# Patient Record
Sex: Female | Born: 1995 | Hispanic: No | Marital: Single
Health system: Northeastern US, Academic
[De-identification: ages and names within clinical notes are randomized; demographics above are authoritative.]

## PROBLEM LIST (undated history)

## (undated) DIAGNOSIS — I471 Supraventricular tachycardia, unspecified: Secondary | ICD-10-CM

## (undated) DIAGNOSIS — F419 Anxiety disorder, unspecified: Secondary | ICD-10-CM

## (undated) DIAGNOSIS — T7840XA Allergy, unspecified, initial encounter: Secondary | ICD-10-CM

## (undated) DIAGNOSIS — R87610 Atypical squamous cells of undetermined significance on cytologic smear of cervix (ASC-US): Secondary | ICD-10-CM

## (undated) DIAGNOSIS — F32A Depression, unspecified: Secondary | ICD-10-CM

## (undated) DIAGNOSIS — J45909 Unspecified asthma, uncomplicated: Secondary | ICD-10-CM

## (undated) DIAGNOSIS — K219 Gastro-esophageal reflux disease without esophagitis: Secondary | ICD-10-CM

## (undated) HISTORY — DX: Anxiety disorder, unspecified: F41.9

## (undated) HISTORY — PX: WISDOM TOOTH EXTRACTION: SHX21

## (undated) HISTORY — DX: Allergy, unspecified, initial encounter: T78.40XA

## (undated) HISTORY — DX: Depression, unspecified: F32.A

## (undated) HISTORY — DX: Atypical squamous cells of undetermined significance on cytologic smear of cervix (ASC-US): R87.610

## (undated) HISTORY — DX: Gastro-esophageal reflux disease without esophagitis: K21.9

## (undated) HISTORY — DX: Supraventricular tachycardia, unspecified: I47.10

## (undated) HISTORY — DX: Unspecified asthma, uncomplicated: J45.909

## (undated) HISTORY — DX: Supraventricular tachycardia: I47.1

---

## 2019-09-02 DIAGNOSIS — Z975 Presence of (intrauterine) contraceptive device: Secondary | ICD-10-CM

## 2019-09-02 HISTORY — DX: Presence of (intrauterine) contraceptive device: Z97.5

## 2020-02-09 ENCOUNTER — Ambulatory Visit: Payer: Self-pay | Admitting: Internal Medicine

## 2020-02-14 ENCOUNTER — Encounter: Payer: Self-pay | Admitting: Internal Medicine

## 2020-02-15 ENCOUNTER — Ambulatory Visit: Payer: Self-pay | Admitting: Internal Medicine

## 2020-02-16 ENCOUNTER — Encounter: Payer: Self-pay | Admitting: Internal Medicine

## 2020-02-16 ENCOUNTER — Telehealth (INDEPENDENT_AMBULATORY_CARE_PROVIDER_SITE_OTHER): Payer: 59 | Admitting: Internal Medicine

## 2020-02-16 ENCOUNTER — Other Ambulatory Visit: Payer: Self-pay

## 2020-02-16 DIAGNOSIS — Z7689 Persons encountering health services in other specified circumstances: Secondary | ICD-10-CM | POA: Diagnosis not present

## 2020-02-16 DIAGNOSIS — F988 Other specified behavioral and emotional disorders with onset usually occurring in childhood and adolescence: Secondary | ICD-10-CM | POA: Insufficient documentation

## 2020-02-16 DIAGNOSIS — R87610 Atypical squamous cells of undetermined significance on cytologic smear of cervix (ASC-US): Secondary | ICD-10-CM | POA: Insufficient documentation

## 2020-02-16 DIAGNOSIS — E559 Vitamin D deficiency, unspecified: Secondary | ICD-10-CM | POA: Diagnosis not present

## 2020-02-16 DIAGNOSIS — R7303 Prediabetes: Secondary | ICD-10-CM

## 2020-02-16 MED ORDER — AMPHETAMINE-DEXTROAMPHETAMINE 15 MG PO TABS: 15.0000 mg | ORAL_TABLET | Freq: Two times a day (BID) | ORAL | 0 refills | Status: DC

## 2020-02-16 NOTE — Progress Notes (Signed)
Virtual Visit via Telephone Note  I connected with Kristina Blair, on 02/16/2020 at 3:38 PM by telephone due to the COVID-19 pandemic and verified that I am speaking with the correct person using two identifiers.   Consent: I discussed the limitations, risks, security and privacy concerns of performing an evaluation and management service by telephone and the availability of in person appointments. I also discussed with the patient that there may be a patient responsible charge related to this service. The patient expressed understanding and agreed to proceed.   Location of Patient: Home  Location of Provider: Clinic    Persons participating in Telemedicine visit: Trenell Moxey [facility] Dr. Earlene Plater    History of Present Illness: Patient has a visit to establish care. Moved here from [place]. Was previously established with PCP. Reports history of ADHD, Bipolar Disorder. In Jan last year was found to be pre-diabetic and have Vit D deficiency. Takes Adderall 15 mg BID for ADHD, has not established with psych yet. Has 3 tablets left of medication. Takes OTC Vit D supplement. Reports abnormal PAP in June 2021.    Past Medical History:  Diagnosis Date   Anxiety    Phreesia 02/13/2020   ASCUS of cervix with negative high risk HPV    Asthma    Phreesia 02/13/2020   Depression    Phreesia 02/13/2020   GERD (gastroesophageal reflux disease)    Phreesia 02/13/2020   Allergies  Allergen Reactions   Dimenhydrinate     Current Outpatient Medications on File Prior to Visit  Medication Sig Dispense Refill   amphetamine-dextroamphetamine (ADDERALL) 15 MG tablet Take 15 mg by mouth 2 (two) times daily.     No current facility-administered medications on file prior to visit.    Observations/Objective: NAD. Speaking clearly.  Work of breathing normal.  Alert and oriented. Mood appropriate.   Assessment and Plan: 1. Encounter to establish care Reviewed patient's  PMH, social history, surgical history, and medications.  Will be due for annual exam, screening blood work, and health maintenance topics in Jan 2022. Have asked patient to return for visit to address these items.   2. Attention deficit disorder, unspecified hyperactivity presence Discussed will not be able to manage her ADHD long term but will do one time refill until able to establish with psych.  - Ambulatory referral to Psychiatry - amphetamine-dextroamphetamine (ADDERALL) 15 MG tablet; Take 1 tablet by mouth 2 (two) times daily.  Dispense: 180 tablet; Refill: 0  3. ASCUS of cervix with negative high risk HPV Able to abstract result from [facility]. Completed 08/12/19. Showed ASCUS with neg high risk HPV. Given age <25, routine screening in 3 year interval is indicated for this result.   4. Vitamin D deficiency Continue OTC supplement. Will check Vit D level at in person exam.   5. Prediabetes Monitor A1c at upcoming annual exam.      Follow Up Instructions: Annual exam    I discussed the assessment and treatment plan with the patient. The patient was provided an opportunity to ask questions and all were answered. The patient agreed with the plan and demonstrated an understanding of the instructions.   The patient was advised to call back or seek an in-person evaluation if the symptoms worsen or if the condition fails to improve as anticipated.     I provided 14 minutes total of non-face-to-face time during this encounter including median intraservice time, reviewing previous notes, investigations, ordering medications, medical decision making, coordinating care and patient verbalized understanding  at the end of the visit.    Marcy Siren, D.O. Primary Care at [facility]  02/16/2020, 3:38 PM

## 2020-04-03 ENCOUNTER — Encounter: Payer: 59 | Admitting: Internal Medicine

## 2020-04-24 ENCOUNTER — Encounter: Payer: 59 | Admitting: Internal Medicine

## 2020-05-08 ENCOUNTER — Encounter: Payer: 59 | Admitting: Internal Medicine

## 2020-07-15 ENCOUNTER — Encounter: Admit: 2020-07-15 | Payer: PRIVATE HEALTH INSURANCE

## 2020-07-15 ENCOUNTER — Inpatient Hospital Stay: Admit: 2020-07-15 | Discharge: 2020-07-15 | Payer: Medicaid - Out of State

## 2020-07-15 LAB — MANUAL DIFFERENTIAL
BKR EOSINOPHILS (DIFF) (MC): 1 % (ref 0–5)
BKR LYMPHOCYTES (DIFF) (MC): 43 % — ABNORMAL HIGH (ref 20–40)
BKR MONOCYTES (DIFF) (MC): 10 % (ref 0–10)
BKR NEUTROPHILS (DIFF) (MC): 46 % — ABNORMAL LOW (ref 50.0–70.0)
BKR PLATELET ESTIMATE (DIFF) (MC): NORMAL
BKR WAM NORMAL RBC MORPHOLOGY: NORMAL

## 2020-07-15 LAB — COMPREHENSIVE METABOLIC PANEL
BKR A/G RATIO: 1.2 (ref 1.0–2.2)
BKR ALANINE AMINOTRANSFERASE (ALT): 15 U/L (ref 14–63)
BKR ALBUMIN: 4.1 g/dL (ref 3.4–5.0)
BKR ALKALINE PHOSPHATASE: 72 U/L (ref 46–116)
BKR ANION GAP: 12 (ref 5–16)
BKR ASPARTATE AMINOTRANSFERASE (AST): 14 U/L (ref 10–42)
BKR AST/ALT RATIO: 0.9
BKR BILIRUBIN TOTAL: 0.5 mg/dL (ref 0.2–1.2)
BKR BLOOD UREA NITROGEN: 11 mg/dL (ref 7–18)
BKR BUN / CREAT RATIO: 10.3 (ref 8.0–23.0)
BKR CALCIUM: 9 mg/dL (ref 8.5–10.5)
BKR CHLORIDE: 105 mmol/L (ref 98–107)
BKR CO2: 23 mmol/L (ref 21–32)
BKR CREATININE: 1.07 mg/dL — ABNORMAL HIGH (ref 0.60–1.00)
BKR EGFR (AFR AMER): >60 mL/min/{1.73_m2} (ref >60)
BKR EGFR (NON AFRICAN AMERICAN): >60 mL/min/{1.73_m2} (ref >60)
BKR GLOBULIN: 3.5 g/dL (ref 2.3–3.5)
BKR GLUCOSE: 120 mg/dL — ABNORMAL HIGH (ref 70–100)
BKR POTASSIUM: 2.9 mmol/L — ABNORMAL LOW (ref 3.5–5.1)
BKR PROTEIN TOTAL: 7.6 g/dL (ref 6.0–8.0)
BKR SODIUM: 140 mmol/L (ref 136–145)

## 2020-07-15 LAB — CBC WITH AUTO DIFFERENTIAL
BKR BASOPHILS (MC): 0.3 % (ref 0.0–1.0)
BKR EOSINOPHILS (MC): 0.7 % (ref 0.0–5.0)
BKR HEMATOCRIT (MC): 40.1 % (ref 36–42)
BKR HEMOGLOBIN (MC): 12.5 g/dL (ref 12.0–15.0)
BKR LYMPHOCYTES (MC): 41.5 % — ABNORMAL HIGH (ref 20.0–40.0)
BKR MCH (MC): 27.6 pg — ABNORMAL LOW (ref 28–32)
BKR MCHC (MC): 31.2 g/dL — ABNORMAL LOW (ref 32–36)
BKR MCV (MC): 88.5 fL (ref 80–94)
BKR MONOCYTES (MC): 10 % (ref 0.0–10.0)
BKR MPV (MC): 10.4 fL (ref 8.8–12.0)
BKR NEUTROPHILS (MC): 47.5 % — ABNORMAL LOW (ref 50.0–70.0)
BKR PLATELETS (MC): 290 10*3/uL (ref 150–400)
BKR RDW-CV (MC): 13 % (ref 11.5–14.5)
BKR RED BLOOD CELL COUNT (MC): 4.5 10*6/uL (ref 3.8–5.0)
BKR WHITE BLOOD CELL COUNT (MC): 5.9 10*3/uL (ref 5.0–10.0)

## 2020-07-15 LAB — MAGNESIUM: BKR MAGNESIUM: 2 mg/dL (ref 1.8–2.5)

## 2020-07-15 MED ORDER — POTASSIUM CHLORIDE ER 20 MEQ TABLET,EXTENDED RELEASE(PART/CRYST)
20 MEQ | ORAL_TABLET | Freq: Every day | ORAL | 1 refills | Status: AC
Start: 2020-07-15 — End: ?

## 2020-07-15 MED ORDER — POTASSIUM CHLORIDE ER 20 MEQ TABLET,EXTENDED RELEASE(PART/CRYST)
20 MEQ | Freq: Once | ORAL | Status: CP
Start: 2020-07-15 — End: ?
  Administered 2020-07-15: 22:00:00 20 MEQ via ORAL

## 2020-07-15 NOTE — ED Notes
4:36 PM Assumed care of patient. Pt BIBA for complaints of palpitations that started an hour prior to arrival. Per EMS patient SVT in 170's on monitor. HR decreased to 120's with vagal maneuvers. R IV in place from EMS. Denies any cardiac history prior to this event. Denies chest pain however endorsing SOB. Able to speak in full sentences, O2 100% on RA. Pt was moving into hotel carrying totes at the time palpitations started. Endorsing numbness/ tingling to fingers as well. VS as charted, pt resting on stretcher. Awaiting provider evaluation at this time 4:38 PM EKG completed, labs drawn, patient placed on cardiac monitor 5:39 PM K+ 2.9 - Dr. Janeann Merl made aware 7:36 PM VSS - HR remains 100-110's, continues on cardiac monitor

## 2020-07-15 NOTE — Discharge Instructions
Take potassium tablets, 2 tablets daily for the next 2 days Call the cardiologist listed above for appointment.

## 2020-07-15 NOTE — ED Provider Notes
HistoryChief Complaint Patient presents with ? Palpitations  25 year old female brought by ambulance to the emergency department because of palpitations with heart rates above 150.  Patient has no history herself but has a family history a SVT.  Patient denies chest pain but had some shortness of breath when her heart rate was very high.  Patient did Valsalva in the ambulance which brought her heart rate down to the 120s.  Patient feels much better now.The history is provided by the patient and the EMS personnel. No language interpreter was used.  No past medical history on file.No past surgical history on file.No family history on file.Social History Socioeconomic History ? Marital status: Single   Spouse name: Not on file ? Number of children: Not on file ? Years of education: Not on file ? Highest education level: Not on file ED Other Social History E-cigarette/Vaping Substances E-cigarette/Vaping Devices Review of Systems Constitutional: Negative.  HENT: Negative.  Eyes: Negative.  Respiratory: Positive for shortness of breath.  Cardiovascular: Positive for palpitations. Gastrointestinal: Negative.  Endocrine: Negative.  Genitourinary: Negative.  Musculoskeletal: Negative.  Skin: Negative.  Allergic/Immunologic: Negative.  Neurological: Negative.  Hematological: Negative.  Psychiatric/Behavioral: Negative.   Physical ExamED Triage Vitals [07/15/20 1632]BP: 122/76Pulse: (!) 120Pulse from  O2 sat: n/aResp: 18Temp: 98.2 ?F (36.8 ?C)Temp src: TemporalSpO2: 100 % BP 110/65  - Pulse (!) 110  - Temp 98.2 ?F (36.8 ?C) (Temporal)  - Resp 20  - Ht 5' 1 (1.549 m)  - Wt 54.4 kg (120 lb)  - SpO2 99%  - BMI 22.67 kg/m? Physical ExamVitals and nursing note reviewed. Constitutional:     General: She is not in acute distress.   Appearance: Normal appearance. She is normal weight. She is not ill-appearing, toxic-appearing or diaphoretic. HENT:    Head: Normocephalic and atraumatic.    Right Ear: External ear normal.    Left Ear: External ear normal. Eyes:    General: No scleral icterus.      Right eye: No discharge.       Left eye: No discharge.    Conjunctiva/sclera: Conjunctivae normal. Neck:    Vascular: No carotid bruit. Cardiovascular:    Rate and Rhythm: Regular rhythm. Tachycardia present.    Heart sounds: Normal heart sounds. No murmur heard. No friction rub. No gallop.  Pulmonary:    Effort: Pulmonary effort is normal. No respiratory distress.    Breath sounds: Normal breath sounds. No stridor. No wheezing, rhonchi or rales. Abdominal:    General: Abdomen is flat. There is no distension.    Palpations: Abdomen is soft.    Tenderness: There is no abdominal tenderness. There is no guarding or rebound. Musculoskeletal:       General: No swelling, tenderness, deformity or signs of injury. Normal range of motion.    Cervical back: Normal range of motion and neck supple. No rigidity or tenderness.    Right lower leg: No edema.    Left lower leg: No edema. Lymphadenopathy:    Cervical: No cervical adenopathy. Skin:   General: Skin is warm and dry.    Coloration: Skin is not jaundiced or pale.    Findings: No bruising, erythema, lesion or rash. Neurological:    General: No focal deficit present.    Mental Status: She is alert and oriented to person, place, and time.    Cranial Nerves: No cranial nerve deficit.    Sensory: No sensory deficit.    Motor: No weakness.    Coordination: Coordination normal.  Gait: Gait normal.    Deep Tendon Reflexes: Reflexes normal. Psychiatric:       Behavior: Behavior normal.       Thought Content: Thought content normal.       Judgment: Judgment normal.    Comments: Mood is anxious.  ProceduresProcedures ED COURSEInterpreted by ED Provider: labsPatient Reevaluation: 25 year old female had an episode of SVT which was terminated by Valsalva maneuver.  Will check laboratory studies including CBC basic chemistries and magnesium.Chemistries remarkable for a potassium of 2.9 with a normal magnesium.  BUN is 11 with a creatinine 1.00.  CBC is unremarkable.  EKG is sinus tachycardia at 104.  There is borderline re pole abnormality.  This may be secondary to previous elevated pulse rate.  Patient was given 60 milliequivalents of oral potassium and will follow this up with another 2 doses of 40 milliequivalents over the next 2 days Patient will be referred to Cardiology for evaluation.  Critical care provided by attending: no critical carePatient progress: improvedStroke DocumentationStroke/TIA: NoClinical Impressions as of Jul 16 1935 Paroxysmal tachycardia [facility] Code) Hypokalemia  ED DispositionDischarge Laureen Ochs, MD05/14/22 1753 Laureen Ochs, MD05/14/22 [phone]

## 2020-07-16 LAB — EXTRA BLUE TOP SPECIMEN     (BH GH LMW)

## 2020-10-26 ENCOUNTER — Encounter: Payer: Medicaid Other | Admitting: Obstetrics and Gynecology

## 2020-10-31 ENCOUNTER — Encounter: Payer: Medicaid Other | Admitting: Advanced Practice Midwife

## 2020-12-05 ENCOUNTER — Other Ambulatory Visit: Payer: Self-pay

## 2020-12-05 ENCOUNTER — Encounter (HOSPITAL_COMMUNITY): Payer: Self-pay | Admitting: *Deleted

## 2020-12-05 ENCOUNTER — Emergency Department (HOSPITAL_COMMUNITY): Payer: No Typology Code available for payment source

## 2020-12-05 ENCOUNTER — Emergency Department (HOSPITAL_COMMUNITY)
Admission: EM | Admit: 2020-12-05 | Discharge: 2020-12-05 | Disposition: A | Discharge status: Home / Self Care | Payer: No Typology Code available for payment source | Attending: Emergency Medicine | Admitting: Emergency Medicine

## 2020-12-05 DIAGNOSIS — R1031 Right lower quadrant pain: Secondary | ICD-10-CM | POA: Diagnosis present

## 2020-12-05 DIAGNOSIS — J45909 Unspecified asthma, uncomplicated: Secondary | ICD-10-CM | POA: Insufficient documentation

## 2020-12-05 DIAGNOSIS — N83201 Unspecified ovarian cyst, right side: Secondary | ICD-10-CM | POA: Diagnosis not present

## 2020-12-05 DIAGNOSIS — K219 Gastro-esophageal reflux disease without esophagitis: Secondary | ICD-10-CM | POA: Insufficient documentation

## 2020-12-05 LAB — URINALYSIS, ROUTINE W REFLEX MICROSCOPIC
Bilirubin Urine: NEGATIVE
Glucose, UA: NEGATIVE mg/dL
Hgb urine dipstick: NEGATIVE
Ketones, ur: NEGATIVE mg/dL
Leukocytes,Ua: NEGATIVE
Nitrite: NEGATIVE
Protein, ur: NEGATIVE mg/dL
Specific Gravity, Urine: <1.005 — ABNORMAL LOW (ref 1.005–1.030)
pH: 7.5 (ref 5.0–8.0)

## 2020-12-05 LAB — COMPREHENSIVE METABOLIC PANEL
ALT: 13 U/L (ref 0–44)
AST: 20 U/L (ref 15–41)
Albumin: 4.3 g/dL (ref 3.5–5.0)
Alkaline Phosphatase: 58 U/L (ref 38–126)
Anion gap: 7 (ref 5–15)
BUN: 8 mg/dL (ref 6–20)
CO2: 24 mmol/L (ref 22–32)
Calcium: 9.5 mg/dL (ref 8.9–10.3)
Chloride: 110 mmol/L (ref 98–111)
Creatinine, Ser: 0.62 mg/dL (ref 0.44–1.00)
GFR, Estimated: >60 mL/min (ref >60)
Glucose, Bld: 82 mg/dL (ref 70–99)
Potassium: 4.2 mmol/L (ref 3.5–5.1)
Sodium: 141 mmol/L (ref 135–145)
Total Bilirubin: 0.4 mg/dL (ref 0.3–1.2)
Total Protein: 7.8 g/dL (ref 6.5–8.1)

## 2020-12-05 LAB — CBC
HCT: 40.3 % (ref 36.0–46.0)
Hemoglobin: 12.6 g/dL (ref 12.0–15.0)
MCH: 28.4 pg (ref 26.0–34.0)
MCHC: 31.3 g/dL (ref 30.0–36.0)
MCV: 91 fL (ref 80.0–100.0)
Platelets: 279 10*3/uL (ref 150–400)
RBC: 4.43 MIL/uL (ref 3.87–5.11)
RDW: 13.1 % (ref 11.5–15.5)
WBC: 7.4 10*3/uL (ref 4.0–10.5)
nRBC: 0 % (ref 0.0–0.2)

## 2020-12-05 LAB — WET PREP, GENITAL
Clue Cells Wet Prep HPF POC: NONE SEEN
Sperm: NONE SEEN
Trich, Wet Prep: NONE SEEN
Yeast Wet Prep HPF POC: NONE SEEN

## 2020-12-05 LAB — I-STAT BETA HCG BLOOD, ED (MC, WL, AP ONLY): I-stat hCG, quantitative: <5 m[IU]/mL (ref <5)

## 2020-12-05 LAB — LIPASE, BLOOD: Lipase: 24 U/L (ref 11–51)

## 2020-12-05 MED ORDER — IOHEXOL 350 MG/ML SOLN
80.0000 mL | Freq: Once | INTRAVENOUS | Status: AC | PRN
  Administered 2020-12-05: 80 mL via INTRAVENOUS

## 2020-12-05 NOTE — ED Provider Notes (Signed)
Emergency Medicine Provider Triage Evaluation Note  Kristina Blair , a 25 y.o. female  was evaluated in triage.  Pt complains of abdominal pain.  She states that she has had right lower abdominal pain since last night.  Her pain has been worsening.  It is primarily there whenever she moves, coughs.  She denies any prior abdominal surgeries.  She states that she has a history of ovarian cysts and this feels somewhat similar.  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  She denies any abnormal discharge or urinary symptoms.  She states that she did have a fever with a temp of 101..  Review of Systems  Positive: Lower abdominal pain, fever.   Negative: Vaginal discharge, dysuria, frequency  Physical Exam  BP 130/77 (BP Location: Right Arm)   Pulse 83   Temp 99 F (37.2 C) (Oral)   Resp 16   LMP 11/21/2020   SpO2 100%  Gen:   Awake, no distress   Resp:  Normal effort  MSK:   Moves extremities without difficulty  Other:  Abdomen is TTP primarily in the RLQ.  Mild guarding.    Medical Decision Making  Medically screening exam initiated at 2:42 PM.  Appropriate orders placed.  Rocio Roam was informed that the remainder of the evaluation will be completed by another provider, this initial triage assessment does not replace that evaluation, and the importance of remaining in the ED until their evaluation is complete.  Labs reviewed.  CT scan ordered.    Cristina Gong, PA-C 12/05/20 1445    Derwood Kaplan, MD 12/15/20 [phone]

## 2020-12-05 NOTE — ED Provider Notes (Signed)
[facility]-EMERGENCY DEPT Provider Note   CSN: [id number] Arrival date & time: 12/05/20  3976     History Chief Complaint  Patient presents with   Abdominal Pain    Kristina Blair is a 25 y.o. female.  The history is provided by the patient.  Abdominal Pain Pain location:  RLQ Pain quality: aching   Pain radiates to:  Does not radiate Pain severity:  Mild Onset quality:  Gradual Duration:  2 days Timing:  Intermittent Progression:  Waxing and waning Chronicity:  Recurrent Context comment:  Hx of ovarain cysts Relieved by:  Nothing Worsened by:  Nothing Associated symptoms: no anorexia, no chest pain, no chills, no cough, no dysuria, no fever, no hematuria, no shortness of breath, no sore throat, no vaginal bleeding, no vaginal discharge and no vomiting       Past Medical History:  Diagnosis Date   Anxiety    Phreesia 02/13/2020   ASCUS of cervix with negative high risk HPV    Asthma    Phreesia 02/13/2020   Depression    Phreesia 02/13/2020   GERD (gastroesophageal reflux disease)    Phreesia 02/13/2020    Patient Active Problem List   Diagnosis Date Noted   ASCUS of cervix with negative high risk HPV 02/16/2020   Attention deficit disorder 02/16/2020   Vitamin D deficiency 02/16/2020   Prediabetes 02/16/2020    Past Surgical History:  Procedure Laterality Date   WISDOM TOOTH EXTRACTION       OB History   No obstetric history on file.     Family History  Problem Relation Age of Onset   Diabetes Mother    Hypertension Mother    Breast cancer Mother    Bipolar disorder Father    Cerebral palsy Brother    Epilepsy Brother    Diabetes Maternal Grandmother     Social History   Tobacco Use   Smoking status: Never   Smokeless tobacco: Never    Home Medications Prior to Admission medications   Medication Sig Start Date End Date Taking? Authorizing Provider  ibuprofen (ADVIL) 200 MG tablet Take 400 mg by mouth every 6  (six) hours as needed for headache, moderate pain or cramping.   Yes [provider]  amphetamine-dextroamphetamine (ADDERALL) 15 MG tablet Take 1 tablet by mouth 2 (two) times daily. Patient not taking: Reported on 12/05/2020 02/16/20   Arvilla Market, MD    Allergies    Kiwi extract  Review of Systems   Review of Systems  Constitutional:  Negative for chills and fever.  HENT:  Negative for ear pain and sore throat.   Eyes:  Negative for pain and visual disturbance.  Respiratory:  Negative for cough and shortness of breath.   Cardiovascular:  Negative for chest pain and palpitations.  Gastrointestinal:  Positive for abdominal pain. Negative for anorexia and vomiting.  Genitourinary:  Negative for dysuria, frequency, hematuria, vaginal bleeding, vaginal discharge and vaginal pain.  Musculoskeletal:  Negative for arthralgias and back pain.  Skin:  Negative for color change and rash.  Neurological:  Negative for seizures and syncope.  All other systems reviewed and are negative.  Physical Exam Updated Vital Signs BP 116/70 (BP Location: Right Arm)   Pulse 77   Temp 98 F (36.7 C) (Oral)   Resp 19   LMP 11/21/2020   SpO2 100%   Physical Exam Vitals and nursing note reviewed.  Constitutional:      General: She is not in  acute distress.    Appearance: She is well-developed.  HENT:     Head: Normocephalic and atraumatic.  Eyes:     Conjunctiva/sclera: Conjunctivae normal.  Cardiovascular:     Rate and Rhythm: Normal rate and regular rhythm.     Heart sounds: No murmur heard. Pulmonary:     Effort: Pulmonary effort is normal. No respiratory distress.     Breath sounds: Normal breath sounds.  Abdominal:     Palpations: Abdomen is soft.     Tenderness: There is abdominal tenderness in the right lower quadrant.  Genitourinary:    Vagina: Normal. No tenderness.     Cervix: No cervical motion tenderness or discharge.     Uterus: Normal.      Adnexa: Right  adnexa normal.       Right: No mass or tenderness.         Left: No mass or tenderness.       Rectum: Normal.  Musculoskeletal:     Cervical back: Neck supple.  Skin:    General: Skin is warm and dry.     Capillary Refill: Capillary refill takes less than 2 seconds.  Neurological:     General: No focal deficit present.     Mental Status: She is alert.    ED Results / Procedures / Treatments   Labs (all labs ordered are listed, but only abnormal results are displayed) Labs Reviewed  WET PREP, GENITAL - Abnormal; Notable for the following components:      Result Value   WBC, Wet Prep HPF POC MODERATE (*)    All other components within normal limits  LIPASE, BLOOD  COMPREHENSIVE METABOLIC PANEL  CBC  URINALYSIS, ROUTINE W REFLEX MICROSCOPIC  I-STAT BETA HCG BLOOD, ED ([facility], [facility], [facility] ONLY)  GC/CHLAMYDIA PROBE AMP ([place]) NOT AT [facility]    EKG None  Radiology US Transvaginal Non-OB  Result Date: 12/05/2020 CLINICAL DATA:  Initial evaluation for right lower quadrant pain with cyst. EXAM: TRANSABDOMINAL AND TRANSVAGINAL ULTRASOUND OF PELVIS DOPPLER ULTRASOUND OF OVARIES TECHNIQUE: Both transabdominal and transvaginal ultrasound examinations of the pelvis were performed. Transabdominal technique was performed for global imaging of the pelvis including uterus, ovaries, adnexal regions, and pelvic cul-de-sac. It was necessary to proceed with endovaginal exam following the transabdominal exam to visualize the uterus, endometrium, and ovaries. Color and duplex Doppler ultrasound was utilized to evaluate blood flow to the ovaries. COMPARISON:  CT from earlier the same day. FINDINGS: Uterus Measurements: 7.3 x 3.5 x 3.8 cm = volume: 51.8 mL. No fibroids or other mass visualized. Endometrium Thickness: 2.9 mm. No focal abnormality visualized. IUD in appropriate position within the endometrial cavity. Right ovary Measurements: 4.5 x 2.5 x 3.2 cm = volume: 18.7 mL. 3.7 x 2.2 x 3.0 cm complex cysts,  consistent with a hemorrhagic cyst. Left ovary Measurements: 2.0 x 1.2 x 2.2 cm = volume: 2.8 mL. Normal appearance/no adnexal mass. Pulsed Doppler evaluation of both ovaries demonstrates normal low-resistance arterial and venous waveforms. Other findings Small volume free fluid seen within the pelvis. IMPRESSION: 1. 3.7 cm hemorrhagic right ovarian cyst with associated small volume free fluid within the pelvis. 2. No evidence for torsion or other acute abnormality. Electronically Signed   By: Rise Mu M.D.   On: 12/05/2020 19:24   US Pelvis Complete  Result Date: 12/05/2020 CLINICAL DATA:  Initial evaluation for right lower quadrant pain with cyst. EXAM: TRANSABDOMINAL AND TRANSVAGINAL ULTRASOUND OF PELVIS DOPPLER ULTRASOUND OF OVARIES TECHNIQUE: Both transabdominal and  transvaginal ultrasound examinations of the pelvis were performed. Transabdominal technique was performed for global imaging of the pelvis including uterus, ovaries, adnexal regions, and pelvic cul-de-sac. It was necessary to proceed with endovaginal exam following the transabdominal exam to visualize the uterus, endometrium, and ovaries. Color and duplex Doppler ultrasound was utilized to evaluate blood flow to the ovaries. COMPARISON:  CT from earlier the same day. FINDINGS: Uterus Measurements: 7.3 x 3.5 x 3.8 cm = volume: 51.8 mL. No fibroids or other mass visualized. Endometrium Thickness: 2.9 mm. No focal abnormality visualized. IUD in appropriate position within the endometrial cavity. Right ovary Measurements: 4.5 x 2.5 x 3.2 cm = volume: 18.7 mL. 3.7 x 2.2 x 3.0 cm complex cysts, consistent with a hemorrhagic cyst. Left ovary Measurements: 2.0 x 1.2 x 2.2 cm = volume: 2.8 mL. Normal appearance/no adnexal mass. Pulsed Doppler evaluation of both ovaries demonstrates normal low-resistance arterial and venous waveforms. Other findings Small volume free fluid seen within the pelvis. IMPRESSION: 1. 3.7 cm hemorrhagic right ovarian  cyst with associated small volume free fluid within the pelvis. 2. No evidence for torsion or other acute abnormality. Electronically Signed   By: Rise Mu M.D.   On: 12/05/2020 19:24   CT ABDOMEN PELVIS W CONTRAST  Result Date: 12/05/2020 CLINICAL DATA:  Right lower quadrant abdominal pain since last night. EXAM: CT ABDOMEN AND PELVIS WITH CONTRAST TECHNIQUE: Multidetector CT imaging of the abdomen and pelvis was performed using the standard protocol following bolus administration of intravenous contrast. CONTRAST:  44mL OMNIPAQUE IOHEXOL 350 MG/ML SOLN COMPARISON:  None. FINDINGS: Lower chest: Clear lung bases. Hepatobiliary: No focal liver abnormality is seen. No gallstones, gallbladder wall thickening, or biliary dilatation. Pancreas: Unremarkable. No pancreatic ductal dilatation or surrounding inflammatory changes. Spleen: Normal in size without focal abnormality. Adrenals/Urinary Tract: Adrenal glands are unremarkable. Kidneys are normal, without renal calculi, focal lesion, or hydronephrosis. Bladder is unremarkable. Stomach/Bowel: Normal appendix visualized. Unremarkable stomach. Small bowel and colon are normal in caliber. No wall thickening. No inflammation. Vascular/Lymphatic: No significant vascular findings are present. No enlarged abdominal or pelvic lymph nodes. Reproductive: Uterus normal in size. Well-positioned IUD. Right ovarian/adnexal cyst, 4 cm. Small amount of pelvic free fluid. Other: None. Musculoskeletal: No acute or significant osseous findings. IMPRESSION: 1. Normal appendix. 2. 4 cm right ovarian cyst with a small amount of pelvic free fluid. Consider partial rupture of the cyst as the cause of the patient's right-sided pain. For the ovarian cyst, no follow-up imaging recommended. Note: This recommendation does not apply to premenarchal patients and to those with increased risk (genetic, family history, elevated tumor markers or other high-risk factors) of ovarian cancer.  Reference: JACR 2020 Feb; 17(2):248-254 3. Remainder of the exam is within normal limits. Electronically Signed   By: Amie Portland M.D.   On: 12/05/2020 15:52   [place] Art/Ven Flow Abd Pelv Doppler  Result Date: 12/05/2020 CLINICAL DATA:  Initial evaluation for right lower quadrant pain with cyst. EXAM: TRANSABDOMINAL AND TRANSVAGINAL ULTRASOUND OF PELVIS DOPPLER ULTRASOUND OF OVARIES TECHNIQUE: Both transabdominal and transvaginal ultrasound examinations of the pelvis were performed. Transabdominal technique was performed for global imaging of the pelvis including uterus, ovaries, adnexal regions, and pelvic cul-de-sac. It was necessary to proceed with endovaginal exam following the transabdominal exam to visualize the uterus, endometrium, and ovaries. Color and duplex Doppler ultrasound was utilized to evaluate blood flow to the ovaries. COMPARISON:  CT from earlier the same day. FINDINGS: Uterus Measurements: 7.3 x 3.5 x 3.8 cm =  volume: 51.8 mL. No fibroids or other mass visualized. Endometrium Thickness: 2.9 mm. No focal abnormality visualized. IUD in appropriate position within the endometrial cavity. Right ovary Measurements: 4.5 x 2.5 x 3.2 cm = volume: 18.7 mL. 3.7 x 2.2 x 3.0 cm complex cysts, consistent with a hemorrhagic cyst. Left ovary Measurements: 2.0 x 1.2 x 2.2 cm = volume: 2.8 mL. Normal appearance/no adnexal mass. Pulsed Doppler evaluation of both ovaries demonstrates normal low-resistance arterial and venous waveforms. Other findings Small volume free fluid seen within the pelvis. IMPRESSION: 1. 3.7 cm hemorrhagic right ovarian cyst with associated small volume free fluid within the pelvis. 2. No evidence for torsion or other acute abnormality. Electronically Signed   By: Rise Mu M.D.   On: 12/05/2020 19:24    Procedures Procedures   Medications Ordered in ED Medications  iohexol (OMNIPAQUE) 350 MG/ML injection 80 mL (80 mLs Intravenous Contrast Given 12/05/20 1525)    ED  Course  I have reviewed the triage vital signs and the nursing notes.  Pertinent labs & imaging results that were available during my care of the patient were reviewed by me and considered in my medical decision making (see chart for details).    MDM Rules/Calculators/A&P                           Kristina Blair is here with right lower abdominal pain.  History of ovarian cyst.  Normal vitals.  No fever.  Differential includes UTI versus ovarian cyst versus appendicitis.  CT scan shows 4 cm uncomplicated ovarian cyst with may be rupture.  Lab work shows no significant anemia, electrolyte ab Mody, kidney injury.  She has no real tenderness on pelvic exam or discharge.  She is not concerned about STDs.  Will get ultrasound to rule out torsion.  She appears comfortable at this time.  Pregnancy test negative.  Urinalysis negative for infection.  Ultrasound confirms hemorrhagic cyst.  No torsion.  Overall she feels comfortable.  Recommend Tylenol and ibuprofen for pain.  Discharged in good condition.  Given information to follow-up with OB/GYN.  This chart was dictated using voice recognition software.  Despite best efforts to proofread,  errors can occur which can change the documentation meaning.  Final Clinical Impression(s) / ED Diagnoses Final diagnoses:  Cyst of right ovary    Rx / DC Orders ED Discharge Orders     None        Virgina Norfolk, DO 12/05/20 1952

## 2020-12-05 NOTE — ED Triage Notes (Addendum)
Pt complains of right lower abd pain since last night, reports fever last night. Tested herself for UTI and BV, was negative. Reports dipstick showed blood. Reports slight diarrhea this morning. Hx ovarian cysts, feels like pain is similar.

## 2020-12-06 LAB — GC/CHLAMYDIA PROBE AMP (~~LOC~~) NOT AT ARMC
Chlamydia: NEGATIVE
Comment: NEGATIVE
Comment: NORMAL
Neisseria Gonorrhea: NEGATIVE

## 2021-01-17 ENCOUNTER — Ambulatory Visit (INDEPENDENT_AMBULATORY_CARE_PROVIDER_SITE_OTHER): Payer: No Typology Code available for payment source | Admitting: Family Medicine

## 2021-01-17 ENCOUNTER — Other Ambulatory Visit: Payer: Self-pay

## 2021-01-17 ENCOUNTER — Telehealth (HOSPITAL_BASED_OUTPATIENT_CLINIC_OR_DEPARTMENT_OTHER): Payer: Self-pay

## 2021-01-17 ENCOUNTER — Encounter (HOSPITAL_BASED_OUTPATIENT_CLINIC_OR_DEPARTMENT_OTHER): Payer: Self-pay | Admitting: Family Medicine

## 2021-01-17 VITALS — BP 122/76 | HR 99 | Ht 61.0 in | Wt 154.0 lb

## 2021-01-17 DIAGNOSIS — Z23 Encounter for immunization: Secondary | ICD-10-CM

## 2021-01-17 DIAGNOSIS — R238 Other skin changes: Secondary | ICD-10-CM

## 2021-01-17 DIAGNOSIS — R002 Palpitations: Secondary | ICD-10-CM | POA: Diagnosis not present

## 2021-01-17 DIAGNOSIS — F419 Anxiety disorder, unspecified: Secondary | ICD-10-CM

## 2021-01-17 DIAGNOSIS — R7303 Prediabetes: Secondary | ICD-10-CM

## 2021-01-17 DIAGNOSIS — E162 Hypoglycemia, unspecified: Secondary | ICD-10-CM | POA: Insufficient documentation

## 2021-01-17 DIAGNOSIS — R946 Abnormal results of thyroid function studies: Secondary | ICD-10-CM

## 2021-01-17 DIAGNOSIS — Z975 Presence of (intrauterine) contraceptive device: Secondary | ICD-10-CM

## 2021-01-17 DIAGNOSIS — F32A Depression, unspecified: Secondary | ICD-10-CM

## 2021-01-17 DIAGNOSIS — Z7689 Persons encountering health services in other specified circumstances: Secondary | ICD-10-CM

## 2021-01-17 DIAGNOSIS — F988 Other specified behavioral and emotional disorders with onset usually occurring in childhood and adolescence: Secondary | ICD-10-CM

## 2021-01-17 HISTORY — DX: Abnormal results of thyroid function studies: R94.6

## 2021-01-17 HISTORY — DX: Hypoglycemia, unspecified: E16.2

## 2021-01-17 NOTE — Patient Instructions (Signed)
  Medication Instructions:  Your physician recommends that you continue on your current medications as directed. Please refer to the Current Medication list given to you today. --If you need a refill on any your medications before your next appointment, please call your pharmacy first. If no refills are authorized on file call the office.-- Lab Work: Your physician has recommended that you have lab work today: CBC, CMP, Lipid, A1C, TSH If you have labs (blood work) drawn today and your tests are completely normal, you will receive your results via MyChart message OR a phone call from our staff.  Please ensure you check your voicemail in the event that you authorized detailed messages to be left on a delegated number. If you have any lab test that is abnormal or we need to change your treatment, we will call you to review the results.  Referrals/Procedures/Imaging: A referral has been placed for you to [facility] (endocrinology)  for evaluation and treatment. Someone from the scheduling department will be in contact with you in regards to coordinating your consultation. If you do not hear from any of the schedulers within 7-10 business days please give their office a call.  A referral has been placed for you to [facility] for evaluation and treatment. Someone from the scheduling department will be in contact with you in regards to coordinating your consultation. If you do not hear from any of the schedulers within 7-10 business days please give their office a call.  A referral has been placed for you to [facility] and [facility] for evaluation and treatment. Someone from the scheduling department will be in contact with you in regards to coordinating your consultation. If you do not hear from any of the schedulers within 7-10 business days please give their office a call.  A referral has been placed for you to [facility] for evaluation and treatment. Someone from the scheduling department will be in contact with you in regards to coordinating your consultation. If you do not hear from any of the schedulers within 7-10 business days please give their office a call.  [facility]  [address], [place], [place] [address] Phone: [phone]  Follow-Up: Your next appointment:   Your physician recommends that you schedule a follow-up appointment in: 3 MONTHS with Dr. de [place]  You will receive a text message or e-mail with a link to a survey about your care and experience with [place] today! We would greatly appreciate your feedback!   Thanks for letting us be apart of your health journey!!  Primary Care and Sports Medicine   Dr. Ceasar Mons [place]   We encourage you to activate your patient portal called "MyChart".  Sign up information is provided on this After Visit Summary.  MyChart is used to connect with patients for Virtual Visits (Telemedicine).  Patients are able to view lab/test results, encounter notes, upcoming appointments, etc.  Non-urgent messages can be sent to your provider as well. To learn more about what you can do with MyChart, please visit --  [email or web address].

## 2021-01-17 NOTE — Telephone Encounter (Signed)
Patient called to remind provider to submit a referral for dermatology

## 2021-01-17 NOTE — Progress Notes (Signed)
New Patient Office Visit  Subjective:  Patient ID: Kristina Blair, female    DOB: April 08, 1995  Age: 25 y.o. MRN: [id number]  CC:  Chief Complaint  Patient presents with   [place] - Patient prior PCP in [place]. She presents today to request referrals for various specialist based on previous  SVT episodes and prediabetes with hypoglycemic episodes.    SVT    In March patient was seen in the ED in [place] for SVT. She was recorded to have a HR of 184 and chest pain. She states she did pass out and was evaluated and released and advised to follow up with Cardiology.    Hypoglycemia    Patient states she wore a CGM under the advisement of her previous endocrinologist and was told she was becoming hypoglycemic at night and after she ate. She was having associated night sweats and fatigue.     HPI Kristina Blair is a 25 year old female presenting to establish in clinic.  She has current concerns as outlined above.  Reports past medical history of anxiety, depression, ADHD, possible prediabetes, abnormal Pap smear.  She also is current issues with palpitations.  Requesting referrals to various providers Requesting referral to psychiatry for management of anxiety, depression, ADHD.  Indicates that in the past she was on Lamictal, Wellbutrin, clonazepam, Adderall. Requesting referral to endocrinologist.  Indicates that she had 2 appointments with endocrinologist in [place] prior to moving to [facility].  Indicates that she is being evaluated for possible prediabetes, issues with low blood sugar. Requesting referral to cardiology due to issues with palpitations which have been more recent.  Indicates that she has been seen in the emergency department related to issues with SVT. Requesting referral to OB/GYN to establish.  Indicates history of abnormal Pap smear in the past.  Patient is originally from [place].  She was attending [employer] prior to moving to [facility].  She is now attending [employer] state.  Indicates that she has about 2 years left in her program, she is currently studying nursing.  Past Medical History:  Diagnosis Date   Allergy    Anxiety    Phreesia 02/13/2020   ASCUS of cervix with negative high risk HPV    Asthma    Phreesia 02/13/2020   Depression    Phreesia 02/13/2020   GERD (gastroesophageal reflux disease)    Phreesia 02/13/2020   Paroxysmal SVT (supraventricular tachycardia) (HCC)     Past Surgical History:  Procedure Laterality Date   WISDOM TOOTH EXTRACTION      Family History  Problem Relation Age of Onset   Diabetes Mother    Hypertension Mother    Breast cancer Mother    Early death Mother    Hyperlipidemia Mother    Miscarriages / Stillbirths Mother    Obesity Mother    Bipolar disorder Father    Cerebral palsy Brother    Epilepsy Brother    Cancer Maternal Aunt        uterine cancer   Cancer Maternal Grandmother        uterine cancer   Diabetes Maternal Grandmother    Heart disease Maternal Grandmother    Hyperlipidemia Maternal Grandmother    Hypertension Maternal Grandmother    Cancer Maternal Grandfather        leukemia    Social History   Socioeconomic History   Marital status: Single    Spouse name: Not on file   Number of children:  Not on file   Years of education: Not on file   Highest education level: Not on file  Occupational History   Not on file  Tobacco Use   Smoking status: Never   Smokeless tobacco: Never  Vaping Use   Vaping Use: Never used  Substance and Sexual Activity   Alcohol use: Not Currently   Drug use: Never   Sexual activity: Never  Other Topics Concern   Not on file  Social History Narrative   Not on file   Social Determinants of Health   Financial Resource Strain: Not on file  Food Insecurity: Not on file  Transportation Needs: Not on file  Physical Activity: Not on file  Stress: Not on file  Social Connections: Not on file   Intimate Partner Violence: Not on file    Objective:   Today's Vitals: BP 122/76   Pulse 99   Ht 5\' 1"  (1.549 m)   Wt 154 lb (69.9 kg)   SpO2 100%   BMI 29.10 kg/m   Physical Exam  25 year old female in no acute distress Cardiovascular exam with regular rate and rhythm, no murmur appreciated Lungs clear to auscultation bilaterally  Assessment & Plan:   Problem List Items Addressed This Visit       Endocrine   Hypoglycemia    Reports history of issues with low blood sugar for which she was seeing endocrinology in the past We will request records of prior endocrinology visits Will refer to local endocrinologist for patient to establish for further evaluation      Relevant Orders   Ambulatory referral to Endocrinology   CBC with Differential/Platelet (Completed)   Hemoglobin A1c (Completed)     Other   Attention deficit disorder    Was being managed by psychiatry in conjunction with history of anxiety and depression, referral placed today      Relevant Orders   Ambulatory referral to Psychiatry   Prediabetes    Reports history of prediabetes with associated issues of intermittent low blood sugars Referral to endocrinology for patient to establish, further evaluation as per endocrinology      Relevant Orders   Ambulatory referral to Endocrinology   Lipid panel (Completed)   Anxiety and depression    History of anxiety and depression, was being managed by psychiatry in the past, will place referral today      Relevant Orders   CBC with Differential/Platelet (Completed)   Comprehensive metabolic panel (Completed)   Palpitations - Primary    History of SVT reported with associated palpitations, referral to cardiology placed today for further evaluation      Relevant Orders   Ambulatory referral to Cardiology   CBC with Differential/Platelet (Completed)   Comprehensive metabolic panel (Completed)   Lipid panel (Completed)   TSH Rfx on Abnormal to Free T4  (Completed)   Abnormal thyroid function test    Patient reports having an abnormal thyroid function test in the past Will check thyroid labs today to further assess      Relevant Orders   Ambulatory referral to Endocrinology   Other Visit Diagnoses     Establishing care with new doctor, encounter for       Relevant Orders   Ambulatory referral to Obstetrics / Gynecology   IUD (intrauterine device) in place       Relevant Orders   Ambulatory referral to Obstetrics / Gynecology   Encounter for immunization       Relevant Orders   Flu Vaccine QUAD  6+ mos PF IM (Fluarix Quad PF) (Completed)       Outpatient Encounter Medications as of 01/17/2021  Medication Sig   amphetamine-dextroamphetamine (ADDERALL) 15 MG tablet Take 1 tablet by mouth 2 (two) times daily. (Patient not taking: No sig reported)   [DISCONTINUED] ibuprofen (ADVIL) 200 MG tablet Take 400 mg by mouth every 6 (six) hours as needed for headache, moderate pain or cramping.   No facility-administered encounter medications on file as of 01/17/2021.    Follow-up: No follow-ups on file.   Crystol Walpole J [place], MD

## 2021-01-18 LAB — CBC WITH DIFFERENTIAL/PLATELET
Basophils Absolute: 0 10*3/uL (ref 0.0–0.2)
Basos: 0 %
EOS (ABSOLUTE): 0.1 10*3/uL (ref 0.0–0.4)
Eos: 1 %
Hematocrit: 39.1 % (ref 34.0–46.6)
Hemoglobin: 13 g/dL (ref 11.1–15.9)
Immature Grans (Abs): 0 10*3/uL (ref 0.0–0.1)
Immature Granulocytes: 0 %
Lymphocytes Absolute: 1.8 10*3/uL (ref 0.7–3.1)
Lymphs: 30 %
MCH: 28.3 pg (ref 26.6–33.0)
MCHC: 33.2 g/dL (ref 31.5–35.7)
MCV: 85 fL (ref 79–97)
Monocytes Absolute: 0.6 10*3/uL (ref 0.1–0.9)
Monocytes: 9 %
Neutrophils Absolute: 3.7 10*3/uL (ref 1.4–7.0)
Neutrophils: 60 %
Platelets: 297 10*3/uL (ref 150–450)
RBC: 4.59 x10E6/uL (ref 3.77–5.28)
RDW: 11.8 % (ref 11.7–15.4)
WBC: 6.1 10*3/uL (ref 3.4–10.8)

## 2021-01-18 LAB — COMPREHENSIVE METABOLIC PANEL
ALT: 9 IU/L (ref 0–32)
AST: 20 IU/L (ref 0–40)
Albumin/Globulin Ratio: 2 (ref 1.2–2.2)
Albumin: 4.9 g/dL (ref 3.9–5.0)
Alkaline Phosphatase: 79 IU/L (ref 44–121)
BUN/Creatinine Ratio: 9 (ref 9–23)
BUN: 8 mg/dL (ref 6–20)
Bilirubin Total: 0.3 mg/dL (ref 0.0–1.2)
CO2: 21 mmol/L (ref 20–29)
Calcium: 9.5 mg/dL (ref 8.7–10.2)
Chloride: 103 mmol/L (ref 96–106)
Creatinine, Ser: 0.89 mg/dL (ref 0.57–1.00)
Globulin, Total: 2.4 g/dL (ref 1.5–4.5)
Glucose: 78 mg/dL (ref 70–99)
Potassium: 4 mmol/L (ref 3.5–5.2)
Sodium: 139 mmol/L (ref 134–144)
Total Protein: 7.3 g/dL (ref 6.0–8.5)
eGFR: 93 mL/min/{1.73_m2} (ref >59)

## 2021-01-18 LAB — LIPID PANEL
Chol/HDL Ratio: 2.5 ratio (ref 0.0–4.4)
Cholesterol, Total: 163 mg/dL (ref 100–199)
HDL: 66 mg/dL (ref >39)
LDL Chol Calc (NIH): 88 mg/dL (ref 0–99)
Triglycerides: 40 mg/dL (ref 0–149)
VLDL Cholesterol Cal: 9 mg/dL (ref 5–40)

## 2021-01-18 LAB — HEMOGLOBIN A1C
Est. average glucose Bld gHb Est-mCnc: 103 mg/dL
Hgb A1c MFr Bld: 5.2 % (ref 4.8–5.6)

## 2021-01-18 LAB — TSH RFX ON ABNORMAL TO FREE T4: TSH: 0.751 u[IU]/mL (ref 0.450–4.500)

## 2021-01-20 NOTE — Progress Notes (Signed)
ok 

## 2021-01-22 NOTE — Assessment & Plan Note (Signed)
Reports history of issues with low blood sugar for which she was seeing endocrinology in the past We will request records of prior endocrinology visits Will refer to local endocrinologist for patient to establish for further evaluation

## 2021-01-22 NOTE — Assessment & Plan Note (Signed)
History of anxiety and depression, was being managed by psychiatry in the past, will place referral today

## 2021-01-22 NOTE — Assessment & Plan Note (Signed)
Patient reports having an abnormal thyroid function test in the past Will check thyroid labs today to further assess

## 2021-01-22 NOTE — Assessment & Plan Note (Signed)
Was being managed by psychiatry in conjunction with history of anxiety and depression, referral placed today

## 2021-01-22 NOTE — Assessment & Plan Note (Signed)
Reports history of prediabetes with associated issues of intermittent low blood sugars Referral to endocrinology for patient to establish, further evaluation as per endocrinology

## 2021-01-22 NOTE — Assessment & Plan Note (Signed)
History of SVT reported with associated palpitations, referral to cardiology placed today for further evaluation

## 2021-02-08 ENCOUNTER — Ambulatory Visit: Payer: 59 | Admitting: Nurse Practitioner

## 2021-02-21 NOTE — Addendum Note (Signed)
Addended by: Dareen Piano on: 02/21/2021 09:29 AM   Modules accepted: Orders

## 2021-02-23 ENCOUNTER — Ambulatory Visit: Payer: 59 | Admitting: Family Medicine

## 2021-02-28 ENCOUNTER — Encounter (HOSPITAL_BASED_OUTPATIENT_CLINIC_OR_DEPARTMENT_OTHER): Payer: Self-pay

## 2021-03-01 ENCOUNTER — Telehealth (HOSPITAL_BASED_OUTPATIENT_CLINIC_OR_DEPARTMENT_OTHER): Payer: Self-pay

## 2021-03-01 NOTE — Telephone Encounter (Signed)
Patient called an left a voicemail inquiring of "paperwork" had been completed and faxed to her employer I cannot see anywhere in the patients chart where any paperwork has been submitted Will route to Dr. Tommi Rumps [place] for advisement.

## 2021-03-13 ENCOUNTER — Ambulatory Visit (INDEPENDENT_AMBULATORY_CARE_PROVIDER_SITE_OTHER): Payer: No Typology Code available for payment source | Admitting: Cardiology

## 2021-03-13 ENCOUNTER — Ambulatory Visit (INDEPENDENT_AMBULATORY_CARE_PROVIDER_SITE_OTHER): Payer: No Typology Code available for payment source

## 2021-03-13 ENCOUNTER — Other Ambulatory Visit: Payer: Self-pay

## 2021-03-13 ENCOUNTER — Encounter (HOSPITAL_BASED_OUTPATIENT_CLINIC_OR_DEPARTMENT_OTHER): Payer: Self-pay | Admitting: Cardiology

## 2021-03-13 VITALS — BP 122/70 | HR 81 | Resp 20 | Ht 61.0 in | Wt 131.0 lb

## 2021-03-13 DIAGNOSIS — R002 Palpitations: Secondary | ICD-10-CM

## 2021-03-13 DIAGNOSIS — Z7189 Other specified counseling: Secondary | ICD-10-CM

## 2021-03-13 DIAGNOSIS — I471 Supraventricular tachycardia: Secondary | ICD-10-CM | POA: Diagnosis not present

## 2021-03-13 NOTE — Patient Instructions (Signed)
Medication Instructions:  Your physician recommends that you continue on your current medications as directed. Please refer to the Current Medication list given to you today.  *If you need a refill on your cardiac medications before your next appointment, please call your pharmacy*   Lab Work: None If you have labs (blood work) drawn today and your tests are completely normal, you will receive your results only by: [place] (if you have MyChart) OR A paper copy in the mail If you have any lab test that is abnormal or we need to change your treatment, we will call you to review the results.   Testing/Procedures: [place]- Long Term Monitor Instructions  Your physician has requested you wear a ZIO patch monitor for 14 days.  This is a single patch monitor. [employer] supplies one patch monitor per enrollment. Additional stickers are not available. Please do not apply patch if you will be having a Nuclear Stress Test,  Echocardiogram, Cardiac CT, MRI, or Chest Xray during the period you would be wearing the  monitor. The patch cannot be worn during these tests. You cannot remove and re-apply the  ZIO XT patch monitor.  Your ZIO patch monitor will be mailed 3 day USPS to your address on file. It may take 3-5 days  to receive your monitor after you have been enrolled.  Once you have received your monitor, please review the enclosed instructions. Your monitor  has already been registered assigning a specific monitor serial # to you.  Billing and Patient Assistance Program Information  We have supplied [employer] with any of your insurance information on file for billing purposes. [employer] offers a sliding scale Patient Assistance Program for patients that do not have  insurance, or whose insurance does not completely cover the cost of the ZIO monitor.  You must apply for the Patient Assistance Program to qualify for this discounted rate.  To apply, please call [employer] at [phone], select  option 4, select option 2, ask to apply for  Patient Assistance Program. Theodore Demark will ask your household income, and how many people  are in your household. They will quote your out-of-pocket cost based on that information.  [employer] will also be able to set up a 95-month, interest-free payment plan if needed.  Applying the monitor   Shave hair from upper left chest.  Hold abrader disc by orange tab. Rub abrader in 40 strokes over the upper left chest as  indicated in your monitor instructions.  Clean area with 4 enclosed alcohol pads. Let dry.  Apply patch as indicated in monitor instructions. Patch will be placed under collarbone on left  side of chest with arrow pointing upward.  Rub patch adhesive wings for 2 minutes. Remove white label marked "1". Remove the white  label marked "2". Rub patch adhesive wings for 2 additional minutes.  While looking in a mirror, press and release button in center of patch. A small green light will  flash 3-4 times. This will be your only indicator that the monitor has been turned on.  Do not shower for the first 24 hours. You may shower after the first 24 hours.  Press the button if you feel a symptom. You will hear a small click. Record Date, Time and  Symptom in the Patient Logbook.  When you are ready to remove the patch, follow instructions on the last 2 pages of Patient  Logbook. Stick patch monitor onto the last page of Patient Logbook.  Place Patient Logbook in  the blue and white box. Use locking tab on box and tape box closed  securely. The blue and white box has prepaid postage on it. Please place it in the mailbox as  soon as possible. Your physician should have your test results approximately 7 days after the  monitor has been mailed back to [facility].  Call [place] at [phone] if you have questions regarding  your ZIO XT patch monitor. Call them immediately if you see an orange light blinking on your  monitor.   If your monitor falls off in less than 4 days, contact our Monitor department at [phone].  If your monitor becomes loose or falls off after 4 days call [employer] at [phone] for  suggestions on securing your monitor    Follow-Up: At [facility], you and your health needs are our priority.  As part of our continuing mission to provide you with exceptional heart care, we have created designated Provider Care Teams.  These Care Teams include your primary Cardiologist (physician) and Advanced Practice Providers (APPs -  Physician Assistants and Nurse Practitioners) who all work together to provide you with the care you need, when you need it.   Your next appointment:   As needed   Other Instructions Consider Evalee Mutton

## 2021-03-13 NOTE — Progress Notes (Signed)
**Note Kristina-Identified via Obfuscation** Cardiology Office Note:    Date:  03/14/2021   ID:  [name], DOB 04-27-1995, MRN [id number]  PCP:  Kristina [place], Raymond Blair, Blair  Cardiologist:  Buford Dresser, Blair  Referring Blair: Kristina Blair   CC: new patient consultation for palpitations  History of Present Illness:    Kristina Blair is a 26 y.o. female with a hx of paroxysmal SVT, GERD, asthma, anxiety, and depression, who is seen as a new consult at the request of Kristina Blair for the evaluation and management of palpitations.  Ms. Debes presented to the ED 07/15/2020 by EMS due to palpitations with heart rates greater than 150 bpm. She had some shortness of breath with very high heart rates. She did Valsalva maneuver in the ambulance and her heart rate lowered to the 120s. EKG showed sinus tachycardia at 104, borderline re-pole abnormality.  She established care with her PCP 01/17/2021 and requested a referral to cardiology due to recent palpitations.  Tachycardia/palpitations: -Initial onset: After her COVID infection in 03/2020. She denies any palpitations or cardiovascular issues prior to COVID. -Frequency/Duration: Daily, unsure of how long her episodes last, but are longer than 5 minutes. -Associated symptoms: During her palpitations her blood pressure spikes, she almost went to the ED yesterday due to BP of 170/110 with HR of 78-80 bpm. -Aggravating/alleviating factors: Occurs more often at night. Lying down with her feet in the air seems to improve her symptoms. -Syncope/near syncope:  Last syncopal episode was in July, she noticed her "vision was blank, and seeing her pulse in her eyes" -Prior cardiac history: None prior to her COVID infection 03/2020. -Prior ECG: EKG during ED visit 07/2020 showed sinus tachycardia at 104, borderline re-pole abnormality. -Prior workup: ED 07/2020. -Prior treatment: Valsalva maneuver -Possible medication interactions: Adderall. Symptoms did not improve when she stopped  Adderall. -Caffeine: None. Feels like caffeine makes her palpitations worse. -Alcohol: None -Tobacco: None -OTC supplements:  Sleep aids here and there. -Comorbidities:  -Exercise level: Typically no physical activity. -Labs: TSH, kidney function/electrolytes, CBC reviewed. -Cardiac ROS: no chest pain, no shortness of breath, no PND, no orthopnea, no LE edema. -Family history: Maternal grandmother has hypertension, hyperlipidemia. Mother has hypertension and hyperlipidemia. Maternal grandfather had stroke, heart attack. Maternal family common for heart attacks and strokes, none known in paternal family..  Lately she has noticed a high resting heart rate while sitting, such as 110 bpm. She monitors her heart rate with an apple watch.  Her grandmother is a diabetic, and she follows her grandmother's diet.  She is in the process of being evaluated by an endocrinologist for chills, fever, and night sweats. Also going to see a dermatologist regarding a rash superior to her eyebrows.  She denies any chest pain, or shortness of breath. No headaches, orthopnea, PND, lower extremity edema or exertional symptoms.  Past Medical History:  Diagnosis Date   Allergy    Anxiety    Phreesia 02/13/2020   ASCUS of cervix with negative high risk HPV    Asthma    Phreesia 02/13/2020   Depression    Phreesia 02/13/2020   GERD (gastroesophageal reflux disease)    Phreesia 02/13/2020   Paroxysmal SVT (supraventricular tachycardia) (HCC)     Past Surgical History:  Procedure Laterality Date   WISDOM TOOTH EXTRACTION      Current Medications: Current Outpatient Medications on File Prior to Visit  Medication Sig   diphenhydrAMINE HCl, Sleep, ([facility] SLEEPAID, DIPHENHYDRAMINE, PO) Take by mouth.  AS NEEDED ONLY AT NIGHT   No current facility-administered medications on file prior to visit.     Allergies:   Kiwi extract   Social History   Tobacco Use   Smoking status: Never   Smokeless tobacco:  Never  Vaping Use   Vaping Use: Never used  Substance Use Topics   Alcohol use: Not Currently   Drug use: Never    Family History: family history includes Bipolar disorder in her father; Breast cancer in her mother; Cancer in her maternal aunt, maternal grandfather, and maternal grandmother; Cerebral palsy in her brother; Diabetes in her maternal grandmother and mother; Early death in her mother; Epilepsy in her brother; Heart disease in her maternal grandmother; Hyperlipidemia in her maternal grandmother and mother; Hypertension in her maternal grandmother and mother; Miscarriages / [place] in her mother; Obesity in her mother.  ROS:   Please see the history of present illness.  Additional pertinent ROS: Constitutional: Negative for unintentional weight loss. Positive for chills, fever, night sweats.  HENT: Negative for ear pain and hearing loss.   Eyes: Negative for loss of vision and eye pain.  Respiratory: Negative for cough, sputum, wheezing.   Cardiovascular: See HPI. Gastrointestinal: Negative for abdominal pain, melena, and hematochezia.  Genitourinary: Negative for dysuria and hematuria.  Musculoskeletal: Negative for falls and myalgias.  Skin: Negative for itching. Positive for rash superior to eyebrows.  Neurological: Negative for focal weakness, focal sensory changes.  Endo/Heme/Allergies: Does not bruise/bleed easily.     EKGs/Labs/Other Studies Reviewed:    The following studies were reviewed today: No prior cardiovascular studies available.   EKG:  EKG is personally reviewed.   03/13/2021: NSR at 81 bpm  Recent Labs: 01/17/2021: ALT 9; BUN 8; Creatinine, Ser 0.89; Hemoglobin 13.0; Platelets 297; Potassium 4.0; Sodium 139; TSH 0.751   Recent Lipid Panel    Component Value Date/Time   CHOL 163 01/17/2021 0926   TRIG 40 01/17/2021 0926   HDL 66 01/17/2021 0926   CHOLHDL 2.5 01/17/2021 0926   LDLCALC 88 01/17/2021 0926    Physical Exam:    VS:  BP  122/70 (BP Location: Left Arm, Patient Position: Sitting, Cuff Size: Normal)    Pulse 81    Resp 20    Ht 5\' 1"  (1.549 m)    Wt 131 lb (59.4 kg)    SpO2 99%    BMI 24.75 kg/m     Wt Readings from Last 3 Encounters:  03/13/21 131 lb (59.4 kg)  01/17/21 154 lb (69.9 kg)    GEN: Well nourished, well developed in no acute distress HEENT: Normal, moist mucous membranes NECK: No JVD CARDIAC: regular rhythm, normal S1 and S2, no rubs or gallops. No murmur. VASCULAR: Radial and DP pulses 2+ bilaterally. No carotid bruits RESPIRATORY:  Clear to auscultation without rales, wheezing or rhonchi  ABDOMEN: Soft, non-tender, non-distended MUSCULOSKELETAL:  Ambulates independently SKIN: Warm and dry, no edema NEUROLOGIC:  Alert and oriented x 3. No focal neuro deficits noted. PSYCHIATRIC:  Normal affect    ASSESSMENT:    1. Heart palpitations   2. PSVT (paroxysmal supraventricular tachycardia) (HCC)   3. Cardiac risk counseling   4. Counseling on health promotion and disease prevention    PLAN:    Heart palpitations History of paroxysmal SVT -discussed Zio monitor and [place] today -will proceed with Zio monitor -echo if monitor abnormal -counseled on red flag warning signs that need immediate medical attention  Cardiac risk counseling and prevention recommendations: -recommend  heart healthy/Mediterranean diet, with whole grains, fruits, vegetable, fish, lean meats, nuts, and olive oil. Limit salt. -recommend moderate walking, 3-5 times/week for 30-50 minutes each session. Aim for at least 150 minutes.week. Goal should be pace of 3 miles/hours, or walking 1.5 miles in 30 minutes -recommend avoidance of tobacco products. Avoid excess alcohol. -ASCVD risk score: The ASCVD Risk score (Arnett DK, et al., 2019) failed to calculate for the following reasons:   The 2019 ASCVD risk score is only valid for ages 52 to 19    Plan for follow up: if monitor unremarkable, can follow up as  needed.  Buford Dresser, MD, PhD, [facility]    Medication Adjustments/Labs and Tests Ordered: Current medicines are reviewed at length with the patient today.  Concerns regarding medicines are outlined above.   Orders Placed This Encounter  Procedures   LONG TERM MONITOR (3-14 DAYS)   EKG 12-Lead   No orders of the defined types were placed in this encounter.  Patient Instructions  Medication Instructions:  Your physician recommends that you continue on your current medications as directed. Please refer to the Current Medication list given to you today.  *If you need a refill on your cardiac medications before your next appointment, please call your pharmacy*   Lab Work: None If you have labs (blood work) drawn today and your tests are completely normal, you will receive your results only by: [place] (if you have MyChart) OR A paper copy in the mail If you have any lab test that is abnormal or we need to change your treatment, we will call you to review the results.   Testing/Procedures: [place]- Long Term Monitor Instructions  Your physician has requested you wear a ZIO patch monitor for 14 days.  This is a single patch monitor. [employer] supplies one patch monitor per enrollment. Additional stickers are not available. Please do not apply patch if you will be having a Nuclear Stress Test,  Echocardiogram, Cardiac CT, MRI, or Chest Xray during the period you would be wearing the  monitor. The patch cannot be worn during these tests. You cannot remove and re-apply the  ZIO XT patch monitor.  Your ZIO patch monitor will be mailed 3 day USPS to your address on file. It may take 3-5 days  to receive your monitor after you have been enrolled.  Once you have received your monitor, please review the enclosed instructions. Your monitor  has already been registered assigning a specific monitor serial # to you.  Billing and Patient Assistance  Program Information  We have supplied [employer] with any of your insurance information on file for billing purposes. [employer] offers a sliding scale Patient Assistance Program for patients that do not have  insurance, or whose insurance does not completely cover the cost of the ZIO monitor.  You must apply for the Patient Assistance Program to qualify for this discounted rate.  To apply, please call [employer] at [phone], select option 4, select option 2, ask to apply for  Patient Assistance Program. Theodore Demark will ask your household income, and how many people  are in your household. They will quote your out-of-pocket cost based on that information.  [employer] will also be able to set up a 37-month, interest-free payment plan if needed.  Applying the monitor   Shave hair from upper left chest.  Hold abrader disc by orange tab. Rub abrader in 40 strokes over the upper left chest as  indicated in  your monitor instructions.  Clean area with 4 enclosed alcohol pads. Let dry.  Apply patch as indicated in monitor instructions. Patch will be placed under collarbone on left  side of chest with arrow pointing upward.  Rub patch adhesive wings for 2 minutes. Remove white label marked "1". Remove the white  label marked "2". Rub patch adhesive wings for 2 additional minutes.  While looking in a mirror, press and release button in center of patch. A small green light will  flash 3-4 times. This will be your only indicator that the monitor has been turned on.  Do not shower for the first 24 hours. You may shower after the first 24 hours.  Press the button if you feel a symptom. You will hear a small click. Record Date, Time and  Symptom in the Patient Logbook.  When you are ready to remove the patch, follow instructions on the last 2 pages of Patient  Logbook. Stick patch monitor onto the last page of Patient Logbook.  Place Patient Logbook in the blue and white box. Use locking tab on box and tape box  closed  securely. The blue and white box has prepaid postage on it. Please place it in the mailbox as  soon as possible. Your physician should have your test results approximately 7 days after the  monitor has been mailed back to [facility].  Call [place] at [phone] if you have questions regarding  your ZIO XT patch monitor. Call them immediately if you see an orange light blinking on your  monitor.  If your monitor falls off in less than 4 days, contact our Monitor department at [phone].  If your monitor becomes loose or falls off after 4 days call [employer] at [phone] for  suggestions on securing your monitor    Follow-Up: At [facility], you and your health needs are our priority.  As part of our continuing mission to provide you with exceptional heart care, we have created designated Provider Care Teams.  These Care Teams include your primary Cardiologist (physician) and Advanced Practice Providers (APPs -  Physician Assistants and Nurse Practitioners) who all work together to provide you with the care you need, when you need it.   Your next appointment:   As needed   Other Instructions Consider Linus Orn Stumpf,acting as a scribe for Buford Dresser, Blair.,have documented all relevant documentation on the behalf of Buford Dresser, Blair,as directed by  Buford Dresser, Blair while in the presence of Buford Dresser, Blair.  I, Buford Dresser, Blair, have reviewed all documentation for this visit. The documentation on 03/14/21 for the exam, diagnosis, procedures, and orders are all accurate and complete.   Signed, Buford Dresser, MD PhD 03/14/2021 8:48 AM    [place]

## 2021-03-20 ENCOUNTER — Ambulatory Visit (HOSPITAL_BASED_OUTPATIENT_CLINIC_OR_DEPARTMENT_OTHER): Payer: No Typology Code available for payment source | Admitting: Obstetrics & Gynecology

## 2021-03-20 ENCOUNTER — Encounter (HOSPITAL_COMMUNITY): Payer: No Typology Code available for payment source

## 2021-03-20 ENCOUNTER — Telehealth (HOSPITAL_BASED_OUTPATIENT_CLINIC_OR_DEPARTMENT_OTHER): Payer: Self-pay

## 2021-03-20 ENCOUNTER — Other Ambulatory Visit (HOSPITAL_COMMUNITY): Payer: Self-pay | Admitting: *Deleted

## 2021-03-20 DIAGNOSIS — F988 Other specified behavioral and emotional disorders with onset usually occurring in childhood and adolescence: Secondary | ICD-10-CM

## 2021-03-20 DIAGNOSIS — F419 Anxiety disorder, unspecified: Secondary | ICD-10-CM

## 2021-03-20 NOTE — Telephone Encounter (Signed)
Patient called in to request another referral for a different behavorial health group She states that she cannot afford the fees that [facility] is requesting Patient [facility] Referral placed

## 2021-03-21 ENCOUNTER — Inpatient Hospital Stay (HOSPITAL_COMMUNITY): Admission: RE | Admit: 2021-03-21 | Payer: No Typology Code available for payment source | Source: Ambulatory Visit

## 2021-03-21 ENCOUNTER — Encounter (HOSPITAL_COMMUNITY): Payer: Self-pay

## 2021-03-23 ENCOUNTER — Telehealth (HOSPITAL_BASED_OUTPATIENT_CLINIC_OR_DEPARTMENT_OTHER): Payer: Self-pay | Admitting: Family Medicine

## 2021-03-23 NOTE — Telephone Encounter (Signed)
CMA left not with pts paperwork to call patient to let her know they Accommodation questionnaire paperwork would be better suited to be filled out by her cardiologist office since most of her issues are being treated from there office. Let pt know if she has questions to please give our CMA a call or upfront to transfer. Paper work will be in blue Garment/textile technologist under D. Please advise.

## 2021-03-28 ENCOUNTER — Ambulatory Visit (HOSPITAL_COMMUNITY): Payer: No Typology Code available for payment source

## 2021-04-04 ENCOUNTER — Other Ambulatory Visit (HOSPITAL_COMMUNITY)
Admission: RE | Admit: 2021-04-04 | Discharge: 2021-04-04 | Disposition: A | Discharge status: Home / Self Care | Payer: No Typology Code available for payment source | Source: Ambulatory Visit | Attending: Obstetrics & Gynecology | Admitting: Obstetrics & Gynecology

## 2021-04-04 ENCOUNTER — Encounter (HOSPITAL_BASED_OUTPATIENT_CLINIC_OR_DEPARTMENT_OTHER): Payer: Self-pay | Admitting: Obstetrics & Gynecology

## 2021-04-04 ENCOUNTER — Ambulatory Visit (INDEPENDENT_AMBULATORY_CARE_PROVIDER_SITE_OTHER): Payer: No Typology Code available for payment source | Admitting: Obstetrics & Gynecology

## 2021-04-04 ENCOUNTER — Other Ambulatory Visit: Payer: Self-pay

## 2021-04-04 VITALS — BP 133/86 | HR 83 | Ht 61.0 in | Wt 129.6 lb

## 2021-04-04 DIAGNOSIS — Z800 Family history of malignant neoplasm of digestive organs: Secondary | ICD-10-CM | POA: Diagnosis not present

## 2021-04-04 DIAGNOSIS — Z01419 Encounter for gynecological examination (general) (routine) without abnormal findings: Secondary | ICD-10-CM

## 2021-04-04 DIAGNOSIS — Z803 Family history of malignant neoplasm of breast: Secondary | ICD-10-CM | POA: Diagnosis not present

## 2021-04-04 DIAGNOSIS — Z124 Encounter for screening for malignant neoplasm of cervix: Secondary | ICD-10-CM | POA: Diagnosis not present

## 2021-04-04 DIAGNOSIS — R002 Palpitations: Secondary | ICD-10-CM

## 2021-04-04 NOTE — Progress Notes (Signed)
26 y.o. G0 Single Black or [place] American female here for gyn exam/new patient visit.  Pt is a Presenter, broadcasting at [employer].  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  Started college at [employer] as was close to home.  Not SA, never SA.  Has IUD for cycle control.  Was seen in ER 12/2020 due to RLQ pain.  Ultrasound showed ovarian cyst, hemorrhagia.  Pain has resolved.  Results reviewed.  IUD was in correct location.  Does not feel her strings.  Pt has very strong history of cancer in her family:  mother with breast, grandfather with colon, MGGM with ovarian, aunt with uterine.  Feel genetic testing appropriate.  Referral placed.  Pt already has life insurance in place.  Is not concerned about long care health insurance.  Aware positive testing could negatively affect these.  H/O palpitations.  Just finished wearing heart monitor.  Also, has seen endocrinologist and ACTH test planned to assess adrenal function.  No LMP recorded. (Menstrual status: IUD).          Sexually active: No.  The current method of family planning is IUD and abstinence  Exercising: No.   Smoker:  no  Health Maintenance: Pap:  2021 ASCUS History of abnormal Pap:  no MMG:  discuss when to start after genetic testing is complete Screening Labs: 01/2021   reports that she has never smoked. She has never used smokeless tobacco. She reports that she does not currently use alcohol. She reports that she does not use drugs.  Past Medical History:  Diagnosis Date   Allergy    Anxiety    Phreesia 02/13/2020   ASCUS of cervix with negative high risk HPV    Asthma    Phreesia 02/13/2020   Depression    Phreesia 02/13/2020   GERD (gastroesophageal reflux disease)    Phreesia 02/13/2020   Paroxysmal SVT (supraventricular tachycardia) (HCC)     Past Surgical History:  Procedure Laterality Date   WISDOM TOOTH EXTRACTION      Current Outpatient Medications  Medication Sig  Dispense Refill   diphenhydrAMINE HCl, Sleep, ([facility] SLEEPAID, DIPHENHYDRAMINE, PO) Take by mouth. AS NEEDED ONLY AT NIGHT     No current facility-administered medications for this visit.    Family History  Problem Relation Age of Onset   Diabetes Mother    Hypertension Mother    Breast cancer Mother    Early death Mother    Hyperlipidemia Mother    Miscarriages / [place] Mother    Obesity Mother    Bipolar disorder Father    Cerebral palsy Brother    Epilepsy Brother    Cancer Maternal Aunt        uterine cancer   Cancer Maternal Grandmother        uterine cancer   Diabetes Maternal Grandmother    Heart disease Maternal Grandmother    Hyperlipidemia Maternal Grandmother    Hypertension Maternal Grandmother    Cancer Maternal Grandfather        leukemia    Review of Systems  All other systems reviewed and are negative.  Exam:   BP 133/86 (BP Location: Right Arm, Patient Position: Sitting, Cuff Size: Normal)    Pulse 83    Ht 5\' 1"  (1.549 m)    Wt 129 lb 9.6 oz (58.8 kg)    BMI 24.49 kg/m   Height: 5\' 1"  (154.9 cm)  General appearance: alert, cooperative and appears stated age  Head: Normocephalic, without obvious abnormality, atraumatic Neck: no adenopathy, supple, symmetrical, trachea midline and thyroid normal to inspection and palpation Lungs: clear to auscultation bilaterally Breasts: normal appearance, no masses or tenderness Heart: regular rate and rhythm Abdomen: soft, non-tender; bowel sounds normal; no masses,  no organomegaly Extremities: extremities normal, atraumatic, no cyanosis or edema Skin: Skin color, texture, turgor normal. No rashes or lesions Lymph nodes: Cervical, supraclavicular, and axillary nodes normal. No abnormal inguinal nodes palpated Neurologic: Grossly normal   Pelvic: External genitalia:  no lesions              Urethra:  normal appearing urethra with no masses, tenderness or lesions              Bartholins and Skenes: normal                  Vagina: normal appearing vagina with normal color and no discharge, no lesions              Cervix: no lesions              Pap taken: Yes.   Bimanual Exam:  Uterus:  normal size, contour, position, consistency, mobility, non-tender              Adnexa: normal adnexa and no mass, fullness, tenderness               Rectovaginal: Confirms               Anus:  normal sphincter tone, no lesions  Chaperone, Octaviano Batty, CMA, was present for exam.  Assessment/Plan: 1. Well woman exam with routine gynecological exam - pap obtained today - no STD testing needed as pt has never been SA - breast and colon cancer screening guidelines will be discussed once genetic testing has been completed  2. Family history of breast cancer in mother - Ambulatory referral to Genetics  3. Cervical cancer screening - Cytology - PAP( [place])  4. Family history of colon cancer  5. Palpitations - has follow up with Dr. Harrell Gave to review heart monitor results.

## 2021-04-05 LAB — CYTOLOGY - PAP: Diagnosis: NEGATIVE

## 2021-04-06 ENCOUNTER — Telehealth: Payer: Self-pay | Admitting: Genetic Counselor

## 2021-04-06 NOTE — Telephone Encounter (Signed)
Scheduled appt per 2/1 referral. Spoke to pt who is aware of appt date and time. Pt is aware to arrive 15 mins prior to appt time.  °

## 2021-04-10 NOTE — Telephone Encounter (Signed)
Left patient voicemail with Dr. de [place] recommendations

## 2021-04-11 ENCOUNTER — Telehealth (HOSPITAL_BASED_OUTPATIENT_CLINIC_OR_DEPARTMENT_OTHER): Payer: Self-pay | Admitting: Family Medicine

## 2021-04-11 NOTE — Telephone Encounter (Signed)
Providers CMA gave me pts paperwork to have her come pick up and have her Psychologist fill it out. Paperwork is in blue accordion folder for pt to pick up.  Please advise.

## 2021-04-19 ENCOUNTER — Ambulatory Visit (HOSPITAL_BASED_OUTPATIENT_CLINIC_OR_DEPARTMENT_OTHER): Payer: No Typology Code available for payment source | Admitting: Family Medicine

## 2021-04-20 ENCOUNTER — Other Ambulatory Visit: Payer: Self-pay

## 2021-04-20 ENCOUNTER — Ambulatory Visit (HOSPITAL_COMMUNITY)
Admission: RE | Admit: 2021-04-20 | Discharge: 2021-04-20 | Disposition: A | Discharge status: Home / Self Care | Payer: No Typology Code available for payment source | Source: Ambulatory Visit | Attending: Internal Medicine | Admitting: Internal Medicine

## 2021-04-20 DIAGNOSIS — E2740 Unspecified adrenocortical insufficiency: Secondary | ICD-10-CM | POA: Diagnosis present

## 2021-04-20 LAB — ACTH STIMULATION, 3 TIME POINTS
Cortisol, 30 Min: 15.7 ug/dL
Cortisol, 60 Min: 21.7 ug/dL
Cortisol, Base: 7.7 ug/dL

## 2021-04-20 MED ORDER — COSYNTROPIN 0.25 MG IJ SOLR
INTRAMUSCULAR | Status: AC
  Filled 2021-04-20: qty 0.25

## 2021-04-20 MED ORDER — COSYNTROPIN 0.25 MG IJ SOLR
0.2500 mg | Freq: Once | INTRAMUSCULAR | Status: AC
  Administered 2021-04-20: 0.25 mg via INTRAMUSCULAR

## 2021-04-23 ENCOUNTER — Telehealth (HOSPITAL_BASED_OUTPATIENT_CLINIC_OR_DEPARTMENT_OTHER): Payer: Self-pay | Admitting: Family Medicine

## 2021-04-23 NOTE — Telephone Encounter (Signed)
Pt called stated her insurance is taking her visit as a sports med visit and not Primary care. Claim is for 01/07/21. Pt wondering if we need to resubmit claim for it to be billed as primary care. Please advise.

## 2021-04-24 ENCOUNTER — Emergency Department (HOSPITAL_BASED_OUTPATIENT_CLINIC_OR_DEPARTMENT_OTHER)
Admission: EM | Admit: 2021-04-24 | Discharge: 2021-04-24 | Disposition: A | Discharge status: Home / Self Care | Payer: No Typology Code available for payment source | Attending: Emergency Medicine | Admitting: Emergency Medicine

## 2021-04-24 ENCOUNTER — Encounter (HOSPITAL_BASED_OUTPATIENT_CLINIC_OR_DEPARTMENT_OTHER): Payer: Self-pay

## 2021-04-24 ENCOUNTER — Ambulatory Visit (HOSPITAL_BASED_OUTPATIENT_CLINIC_OR_DEPARTMENT_OTHER): Payer: No Typology Code available for payment source | Admitting: Family Medicine

## 2021-04-24 ENCOUNTER — Other Ambulatory Visit: Payer: Self-pay

## 2021-04-24 DIAGNOSIS — R000 Tachycardia, unspecified: Secondary | ICD-10-CM | POA: Insufficient documentation

## 2021-04-24 DIAGNOSIS — J45909 Unspecified asthma, uncomplicated: Secondary | ICD-10-CM | POA: Diagnosis not present

## 2021-04-24 DIAGNOSIS — M79604 Pain in right leg: Secondary | ICD-10-CM | POA: Insufficient documentation

## 2021-04-24 DIAGNOSIS — M79606 Pain in leg, unspecified: Secondary | ICD-10-CM

## 2021-04-24 LAB — URINALYSIS, ROUTINE W REFLEX MICROSCOPIC
Bilirubin Urine: NEGATIVE
Glucose, UA: NEGATIVE mg/dL
Hgb urine dipstick: NEGATIVE
Ketones, ur: NEGATIVE mg/dL
Leukocytes,Ua: NEGATIVE
Nitrite: NEGATIVE
Specific Gravity, Urine: 1.022 (ref 1.005–1.030)
pH: 5.5 (ref 5.0–8.0)

## 2021-04-24 LAB — BASIC METABOLIC PANEL
Anion gap: 10 (ref 5–15)
BUN: 10 mg/dL (ref 6–20)
CO2: 24 mmol/L (ref 22–32)
Calcium: 9.7 mg/dL (ref 8.9–10.3)
Chloride: 105 mmol/L (ref 98–111)
Creatinine, Ser: 0.79 mg/dL (ref 0.44–1.00)
GFR, Estimated: >60 mL/min (ref >60)
Glucose, Bld: 89 mg/dL (ref 70–99)
Potassium: 4 mmol/L (ref 3.5–5.1)
Sodium: 139 mmol/L (ref 135–145)

## 2021-04-24 LAB — PREGNANCY, URINE: Preg Test, Ur: NEGATIVE

## 2021-04-24 LAB — D-DIMER, QUANTITATIVE: D-Dimer, Quant: <0.27 ug/mL-FEU (ref 0.00–0.50)

## 2021-04-24 LAB — MAGNESIUM: Magnesium: 2 mg/dL (ref 1.7–2.4)

## 2021-04-24 MED ORDER — LACTATED RINGERS IV BOLUS: 1000.0000 mL | Freq: Once | INTRAVENOUS | Status: DC

## 2021-04-24 MED ORDER — ACETAMINOPHEN 500 MG PO TABS
1000.0000 mg | ORAL_TABLET | Freq: Once | ORAL | Status: DC
  Filled 2021-04-24: qty 2

## 2021-04-24 NOTE — ED Provider Notes (Signed)
[facility] Provider Note: Lowella Dell, MD, FACEP  CSN: [id number] MRN: [id number] ARRIVAL: 04/24/21 at 0613 ROOM: DB011/DB011   CHIEF COMPLAINT  Leg Pain   HISTORY OF PRESENT ILLNESS  04/24/21 6:30 AM Kristina Blair is a 26 y.o. female with right leg pain that began about 10 PM yesterday evening.  She denies any injury.  The pain is located in her posterior distal thigh, popliteal fossa and proximal calf.  She rates it as an 8 out of 10 but it is not worse with movement or palpation.  She has noticed no swelling.  She has had no recent long travel.  She does not smoke.  She does have a hormonal eluding IUD.  She also has had increased urinary recently.   Past Medical History:  Diagnosis Date   Allergy    Anxiety    [employer]rees[employer] 02/13/2020   ASCUS of cervix with negative high risk HPV    Asthma    [employer]reesia 02/13/2020   Depression    [employer] 02/13/2020   GERD (gastroesophageal reflux disease)    [employer] 02/13/2020   IUD (intrauterine device) in place 09/2019   liletta   Paroxysmal SVT (supraventricular tachycardia) (HCC)     Past Surgical History:  Procedure Laterality Date   WISDOM TOOTH EXTRACTION      Family History  Problem Relation Age of Onset   Diabetes Mother    Hypertension Mother    Breast cancer Mother 46       recurrence age 6   Early death Mother    Hyperlipidemia Mother    Miscarriages / [place] Mother    Obesity Mother    Bipolar disorder Father    Cerebral palsy Brother    Epilepsy Brother    Diabetes Maternal Grandmother    Heart disease Maternal Grandmother    Hyperlipidemia Maternal Grandmother    Hypertension Maternal Grandmother    Cancer Maternal Grandfather        leukemia   Cancer Maternal Aunt 28       uterine cancer, passed age 76   Ovarian cancer Maternal Great-grandmother        passed aroung age 60    Social History   Tobacco Use   Smoking status: Never   Smokeless tobacco: Never  Vaping Use   Vaping Use:  Never used  Substance Use Topics   Alcohol use: Not Currently   Drug use: Never    Prior to Admission medications   Medication Sig Start Date End Date Taking? Authorizing Provider  diphenhydrAMINE HCl, Sleep, ([facility] SLEEPAID, DIPHENHYDRAMINE, PO) Take by mouth. AS NEEDED ONLY AT NIGHT    [provider]    Allergies Kiwi extract   REVIEW OF SYSTEMS  Negative except as noted here or in the History of Present Illness.   PHYSICAL EXAMINATION  Initial Vital Signs Blood pressure (!) 137/93, pulse (!) 110, temperature 98.2 F (36.8 C), temperature source Oral, resp. rate 16, height 5\' 1"  (1.549 m), weight 58.5 kg, last menstrual period 04/19/2021, SpO2 100 %.  Examination General: Well-developed, well-nourished female in no acute distress; appearance consistent with age of record HENT: normocephalic; atraumatic Eyes: Normal appearance Neck: supple Heart: regular rate and rhythm Lungs: clear to auscultation bilaterally Abdomen: soft; nondistended; nontender; bowel sounds present Extremities: No deformity; full range of motion; pulses normal; no edema or tenderness of right lower extremity Neurologic: Awake, alert and oriented; motor function intact in all extremities and symmetric; no facial droop Skin: Warm and dry Psychiatric:  Normal mood and affect   RESULTS  Summary of this visit's results, reviewed and interpreted by myself:   EKG Interpretation  Date/Time:  Tuesday April 24 2021 08:02:48 EST Ventricular Rate:  83 PR Interval:  170 QRS Duration: 68 QT Interval:  346 QTC Calculation: 407 R Axis:   81 Text Interpretation: Sinus rhythm Confirmed by Ernie Avena ([id number]) on 04/24/2021 8:29:52 AM       Laboratory Studies: Results for orders placed or performed during the hospital encounter of 04/24/21 (from the past 24 hour(s))  Urinalysis, Routine w reflex microscopic     Status: Abnormal   Collection Time: 04/24/21  6:35 AM  Result Value Ref Range    Color, Urine YELLOW YELLOW   APPearance CLEAR CLEAR   Specific Gravity, Urine 1.022 1.005 - 1.030   pH 5.5 5.0 - 8.0   Glucose, UA NEGATIVE NEGATIVE mg/dL   Hgb urine dipstick NEGATIVE NEGATIVE   Bilirubin Urine NEGATIVE NEGATIVE   Ketones, ur NEGATIVE NEGATIVE mg/dL   Protein, ur TRACE (A) NEGATIVE mg/dL   Nitrite NEGATIVE NEGATIVE   Leukocytes,Ua NEGATIVE NEGATIVE  Pregnancy, urine     Status: None   Collection Time: 04/24/21  6:35 AM  Result Value Ref Range   Preg Test, Ur NEGATIVE NEGATIVE  D-dimer, quantitative     Status: None   Collection Time: 04/24/21  7:01 AM  Result Value Ref Range   D-Dimer, Quant <0.27 0.00 - 0.50 ug/mL-FEU  Basic metabolic panel     Status: None   Collection Time: 04/24/21  8:21 AM  Result Value Ref Range   Sodium 139 135 - 145 mmol/L   Potassium 4.0 3.5 - 5.1 mmol/L   Chloride 105 98 - 111 mmol/L   CO2 24 22 - 32 mmol/L   Glucose, Bld 89 70 - 99 mg/dL   BUN 10 6 - 20 mg/dL   Creatinine, Ser 0.62 0.44 - 1.00 mg/dL   Calcium 9.7 8.9 - 69.4 mg/dL   GFR, Estimated >85 >46 mL/min   Anion gap 10 5 - 15  Magnesium     Status: None   Collection Time: 04/24/21  8:21 AM  Result Value Ref Range   Magnesium 2.0 1.7 - 2.4 mg/dL   Imaging Studies: No results found.  ED COURSE and MDM  Nursing notes, initial and subsequent vitals signs, including pulse oximetry, reviewed and interpreted by myself.  Vitals:   04/24/21 0900 04/24/21 0915 04/24/21 0930 04/24/21 0937  BP: 110/63 110/61 119/78   Pulse: 74 78 67   Resp: 15 16 11    Temp:    98 F (36.7 C)  TempSrc:    Oral  SpO2: 100% 100% 100%   Weight:      Height:       Medications - No data to display  6:40 AM The patient is low risk for DVT (Wells 0) but the pattern of her pain is concerning.  She has had no injury to explain the pain and no sudden onset such as could happen with a ruptured plantaris tendon.  There is no erythema or warmth to suggest cellulitis.  We will test a D-dimer and if  positive obtain a venous Doppler.  Urinalysis unremarkable.  7:00 AM Signed out to Dr. .   PROCEDURES  Procedures   ED DIAGNOSES     ICD-10-CM   1. Pain of lower extremity, unspecified laterality  M79.606          Estefano Victory, Karene Fry, MD 04/24/21 2238

## 2021-04-24 NOTE — Discharge Instructions (Signed)
The blood test checking for blood clots was negative.  You have no electrolyte abnormality.  You have no evidence of infection in your leg.

## 2021-04-24 NOTE — ED Provider Notes (Signed)
°  Physical Exam  BP (!) 137/93 (BP Location: Right Arm)    Pulse (!) 110    Temp 98.2 F (36.8 C) (Oral)    Resp 16    Ht 5\' 1"  (1.549 m)    Wt 58.5 kg    LMP 04/19/2021 (Exact Date)    SpO2 100%    BMI 24.37 kg/m     Procedures  Procedures  ED Course / MDM    Medical Decision Making Amount and/or Complexity of Data Reviewed Labs: ordered.  Risk OTC drugs.    26 year old female presenting with new onset right lower extremity cramping in her calf that started overnight.  Previous work-up initiated by prior overnight ED provider include urinalysis and D-dimer which resulted negative.  Patient mildly tachycardic on arrival, EKG unremarkable with sinus rhythm noted, no abnormal intervals or ischemic changes.  No chest pain.  No cough.  Physical exam without evidence of erythema or warmth to suggest cellulitis.  Low suspicion for DVT given the patient's negative D-dimer.  She does state that she has a history of low potassium.  She has an IV in place.  We will check a BMP and magnesium and reassess.  Patient laboratory work-up with a BMP that is unremarkable and normal magnesium.  Overall stable for discharge at this time.      Regan Lemming, MD 04/24/21 [phone]

## 2021-04-24 NOTE — ED Triage Notes (Signed)
Complains with right lower leg pain since around 10 pm last night. Denies injury.  Also complains with increased urgency to urinate.

## 2021-04-26 ENCOUNTER — Ambulatory Visit (HOSPITAL_BASED_OUTPATIENT_CLINIC_OR_DEPARTMENT_OTHER): Payer: No Typology Code available for payment source | Admitting: Family Medicine

## 2021-04-28 ENCOUNTER — Encounter (HOSPITAL_BASED_OUTPATIENT_CLINIC_OR_DEPARTMENT_OTHER): Payer: Self-pay

## 2021-04-30 ENCOUNTER — Inpatient Hospital Stay: Payer: No Typology Code available for payment source

## 2021-04-30 ENCOUNTER — Inpatient Hospital Stay: Payer: No Typology Code available for payment source | Admitting: Genetic Counselor

## 2021-04-30 NOTE — Telephone Encounter (Signed)
Okay to schedule in Dr. Judeth Cornfield next available (looks like end of March). Please inform the patient:  "Your testing in the emergency department thankfully was unremarkable. Nothing to worry about. EKG shows normal sinus rhythm which is a good result.   Your monitor preliminary report is available but not yet reviewed by Dr. Harrell Gave. The preliminary report shows normal sinus rhythm with an average heart rate of 75 bpm with no significant arrhythmias. This is a good result! You will receive an additional notification when Dr. Harrell Gave formally reviews the report.   When working out, recommend gradually increasing your activity. For a moderate-intensity workout your target heart rate based on age is 125-148 bpm. If you see your heart rate start to exceed that range, be sure to slow down whatever you are doing. I recommend starting very slow with activity and if you feel dizzy or lightheaded to sit down. Ensure you are hydrating well before and during any exercise with water. It might be beneficial to start small with just walking when the weather is nice or some yoga. There are some great free yoga videos on YouTube!"

## 2021-04-30 NOTE — Telephone Encounter (Signed)
Hey can you do me a favor and look at this for Dr. Osvaldo Shipper and tell me how urgently I may need to schedule her based on symptoms, EKG from ED and heart monitor?    Thanks!

## 2021-05-04 ENCOUNTER — Encounter (HOSPITAL_BASED_OUTPATIENT_CLINIC_OR_DEPARTMENT_OTHER): Payer: Self-pay

## 2021-05-04 ENCOUNTER — Emergency Department (HOSPITAL_BASED_OUTPATIENT_CLINIC_OR_DEPARTMENT_OTHER): Payer: No Typology Code available for payment source | Admitting: Radiology

## 2021-05-04 ENCOUNTER — Other Ambulatory Visit: Payer: Self-pay

## 2021-05-04 ENCOUNTER — Emergency Department (HOSPITAL_BASED_OUTPATIENT_CLINIC_OR_DEPARTMENT_OTHER)
Admission: EM | Admit: 2021-05-04 | Discharge: 2021-05-04 | Disposition: A | Discharge status: Home / Self Care | Payer: No Typology Code available for payment source | Attending: Emergency Medicine | Admitting: Emergency Medicine

## 2021-05-04 ENCOUNTER — Telehealth (HOSPITAL_BASED_OUTPATIENT_CLINIC_OR_DEPARTMENT_OTHER): Payer: Self-pay

## 2021-05-04 DIAGNOSIS — R55 Syncope and collapse: Secondary | ICD-10-CM | POA: Diagnosis not present

## 2021-05-04 DIAGNOSIS — R011 Cardiac murmur, unspecified: Secondary | ICD-10-CM | POA: Insufficient documentation

## 2021-05-04 DIAGNOSIS — R079 Chest pain, unspecified: Secondary | ICD-10-CM

## 2021-05-04 DIAGNOSIS — R072 Precordial pain: Secondary | ICD-10-CM | POA: Diagnosis not present

## 2021-05-04 MED ORDER — DEXAMETHASONE 4 MG PO TABS
10.0000 mg | ORAL_TABLET | Freq: Once | ORAL | Status: AC
  Administered 2021-05-04: 10 mg via ORAL
  Filled 2021-05-04: qty 3

## 2021-05-04 MED ORDER — AEROCHAMBER PLUS FLO-VU MISC
1.0000 | Freq: Once | Status: DC
  Filled 2021-05-04: qty 1

## 2021-05-04 MED ORDER — ALBUTEROL SULFATE HFA 108 (90 BASE) MCG/ACT IN AERS
2.0000 | INHALATION_SPRAY | RESPIRATORY_TRACT | Status: DC | PRN
  Filled 2021-05-04: qty 6.7

## 2021-05-04 MED ORDER — IPRATROPIUM-ALBUTEROL 0.5-2.5 (3) MG/3ML IN SOLN
3.0000 mL | Freq: Once | RESPIRATORY_TRACT | Status: AC
  Administered 2021-05-04: 3 mL via RESPIRATORY_TRACT
  Filled 2021-05-04: qty 3

## 2021-05-04 NOTE — ED Provider Notes (Signed)
?[facility] EMERGENCY DEPT ?Provider Note ? ? ?CSN: [id number] ?Arrival date & time: 05/04/21  5885 ? ?  ? ?History ? ?Chief Complaint  ?Patient presents with  ? Chest Pain  ? ? ?Kristina Blair is a 26 y.o. female. ? ?Patient had and episode of exertional related syncope last week as well. Has cards appointment at the end of the month.  ? ? ?Chest Pain ?Pain location:  Substernal area, L chest, R chest and epigastric ?Pain quality: aching and pressure   ?Pain radiates to:  Does not radiate ?Pain severity:  Mild ?Duration:  2 hours ?Timing:  Constant ?Progression:  Waxing and waning ?Chronicity:  Recurrent ? ?  ? ?Home Medications ?Prior to Admission medications   ?Medication Sig Start Date End Date Taking? Authorizing Provider  ?diphenhydrAMINE HCl, Sleep, ([facility] SLEEPAID, DIPHENHYDRAMINE, PO) Take by mouth. AS NEEDED ONLY AT NIGHT    [provider]  ?   ? ?Allergies    ?Kiwi extract   ? ?Review of Systems   ?Review of Systems  ?Cardiovascular:  Positive for chest pain.  ? ?Physical Exam ?Updated Vital Signs ?BP 130/76   Pulse 87   Temp 98.2 ?F (36.8 ?C)   Resp 16   Ht 5\' 1"  (1.549 m)   Wt 58.5 kg   LMP 04/19/2021 (Exact Date)   SpO2 100%   BMI 24.37 kg/m?  ?Physical Exam ?Vitals and nursing note reviewed.  ?Constitutional:   ?   Appearance: She is well-developed.  ?HENT:  ?   Head: Normocephalic and atraumatic.  ?Cardiovascular:  ?   Rate and Rhythm: Normal rate and regular rhythm.  ?   Heart sounds: Murmur heard.  ?Pulmonary:  ?   Effort: No respiratory distress.  ?   Breath sounds: No stridor.  ?Chest:  ?   Chest wall: No mass or deformity.  ?Abdominal:  ?   General: There is no distension.  ?   Palpations: Abdomen is soft.  ?Musculoskeletal:     ?   General: Normal range of motion.  ?   Cervical back: Normal range of motion.  ?Skin: ?   General: Skin is warm and dry.  ?Neurological:  ?   Mental Status: She is alert.  ? ? ?ED Results / Procedures / Treatments   ?Labs ?(all labs ordered  are listed, but only abnormal results are displayed) ?Labs Reviewed - No data to display ? ?EKG ?EKG Interpretation ? ?Date/Time:  Friday May 04 2021 03:49:22 EST ?Ventricular Rate:  108 ?PR Interval:  142 ?QRS Duration: 78 ?QT Interval:  298 ?QTC Calculation: 399 ?R Axis:   84 ?Text Interpretation: Sinus tachycardia ST & T wave abnormality, consider inferolateral ischemia Abnormal ECG When compared with ECG of 24-Apr-2021 08:02, PREVIOUS ECG IS PRESENT Confirmed by 26-Apr-2021 [phone]) on 05/04/2021 3:55:14 AM ? ?Radiology ?DG Chest 2 View ? ?Result Date: 05/04/2021 ?CLINICAL DATA:  26 year old female with shortness of breath and chest pain. EXAM: CHEST - 2 VIEW COMPARISON:  CT Abdomen and Pelvis 12/05/2020. FINDINGS: Normal lung volumes and mediastinal contours. Bilateral lung markings are within normal limits. Both lungs appear clear. No pneumothorax or pleural effusion. Visualized tracheal air column is within normal limits. No osseous abnormality identified.  Negative visible bowel gas. IMPRESSION: Negative.  No cardiopulmonary abnormality. Electronically Signed   By: 02/04/2021 M.D.   On: 05/04/2021 04:56   ? ?Procedures ?Procedures  ? ? ?Medications Ordered in ED ?Medications  ?albuterol (VENTOLIN HFA) 108 (90 Base) MCG/ACT inhaler  2 puff (has no administration in time range)  ?aerochamber plus with mask device 1 each (has no administration in time range)  ?ipratropium-albuterol (DUONEB) 0.5-2.5 (3) MG/3ML nebulizer solution 3 mL (3 mLs Nebulization Given 05/04/21 0440)  ?dexamethasone (DECADRON) tablet 10 mg (10 mg Oral Given 05/04/21 0508)  ? ? ?ED Course/ Medical Decision Making/ A&P ?  ?                        ?Medical Decision Making ?Amount and/or Complexity of Data Reviewed ?Radiology: ordered. ? ?Risk ?Prescription drug management. ? ? ?Unclear on etiology of symptoms tonight. Not tachycardic now. Overall appears well VS WNL. Needs echo as she has never had one, but not emergently, message to cardiology for  same. Doubt ACS, PE, dissection or other emergent causes with normal CXR (viewed by myself) and no other concerning findings/symptoms.  ? ? ?Final Clinical Impression(s) / ED Diagnoses ?Final diagnoses:  ?Chest pain, unspecified type  ?Heart murmur  ? ? ?Rx / DC Orders ?ED Discharge Orders   ? ? None  ? ?  ? ? ?  ?Marily Memos, MD ?05/04/21 [phone] ? ?

## 2021-05-04 NOTE — ED Notes (Signed)
RT educated pt on proper use of MDI w/spacer for proper administrations/deposition of medication. Pt verbalizes understanding and able to demonstrate/teach back proper administration. Pt also given education and information of pulmonology and PFT since pt has never had one for diagnosis.  ?

## 2021-05-04 NOTE — ED Notes (Signed)
Pt verbalizes understanding of discharge instructions. Opportunity for questioning and answers were provided. Pt discharged from ED to home.   ? ?

## 2021-05-04 NOTE — Telephone Encounter (Addendum)
Rn called patient to schedule echocardiogram!  ? ?Patient is scheduled for 3/13 at 10am at [facility]!  ? ?Patient made mention that her insurance requires pre cert, so I will route to Pre cert team!  ? ? ? ? ? ?----- Message from Alver Sorrow, NP sent at 05/04/2021  7:40 AM EST ----- ?Hi Dr. Clayborne Dana, ? ?Thank you for making [place] aware. The preliminary report of her ZIO monitor thankfully shows no significant arrhythmias. I'll ask the nurse who works with me, Rutherford Guys, to reach out to her to go ahead and order/schedule an echocardiogram due to syncope. That way results are available for her upcoming appointment.  ? ?Thanks,  ?Alver Sorrow, NP  ?----- Message ----- ?From: Marily Memos, MD ?Sent: 05/04/2021   5:12 AM EST ?To: Alver Sorrow, NP, # ? ?Dr. Cristal Deer,  ?I saw a couple communications from you guys in the patient's chart so I wanted to reach out. She came to the ED tonight with CP/SOB. ECG/CXR ok. Didn't see need to check enzymes. I'm wondering if there is a component of anxiety or other metabolic/endocrine issue however she states that there another workup that has been done previously.  ?Only thing I wanted to ask about was that she stated she had an episode of exertional syncope last week and today I hear a heart murmur that wasn't previously recorded. I thought maybe she needed and echocardiogram done and hopefully y'all would consider it and get it before her next visit with you at the end of the month. Thank you.  ?Barbara Cower ? ? ?

## 2021-05-04 NOTE — ED Triage Notes (Signed)
Chest pain and shortness of breath starting approximately 45 mins prior to arrival. ?

## 2021-05-08 ENCOUNTER — Encounter (HOSPITAL_BASED_OUTPATIENT_CLINIC_OR_DEPARTMENT_OTHER): Payer: Self-pay

## 2021-05-08 DIAGNOSIS — R079 Chest pain, unspecified: Secondary | ICD-10-CM

## 2021-05-08 NOTE — Telephone Encounter (Signed)
Patient asking question in regards to EKG done in recent ED visit, please advise!  ?

## 2021-05-10 ENCOUNTER — Emergency Department (HOSPITAL_COMMUNITY): Payer: No Typology Code available for payment source

## 2021-05-10 ENCOUNTER — Encounter (HOSPITAL_COMMUNITY): Payer: Self-pay

## 2021-05-10 ENCOUNTER — Emergency Department (HOSPITAL_COMMUNITY)
Admission: EM | Admit: 2021-05-10 | Discharge: 2021-05-10 | Disposition: A | Discharge status: Home / Self Care | Payer: No Typology Code available for payment source | Attending: Emergency Medicine | Admitting: Emergency Medicine

## 2021-05-10 ENCOUNTER — Other Ambulatory Visit: Payer: Self-pay

## 2021-05-10 DIAGNOSIS — R112 Nausea with vomiting, unspecified: Secondary | ICD-10-CM | POA: Insufficient documentation

## 2021-05-10 DIAGNOSIS — R0789 Other chest pain: Secondary | ICD-10-CM | POA: Insufficient documentation

## 2021-05-10 DIAGNOSIS — R519 Headache, unspecified: Secondary | ICD-10-CM | POA: Insufficient documentation

## 2021-05-10 DIAGNOSIS — M79602 Pain in left arm: Secondary | ICD-10-CM | POA: Insufficient documentation

## 2021-05-10 DIAGNOSIS — E876 Hypokalemia: Secondary | ICD-10-CM

## 2021-05-10 LAB — BASIC METABOLIC PANEL
Anion gap: 7 (ref 5–15)
BUN: 9 mg/dL (ref 6–20)
CO2: 24 mmol/L (ref 22–32)
Calcium: 9.7 mg/dL (ref 8.9–10.3)
Chloride: 104 mmol/L (ref 98–111)
Creatinine, Ser: 0.67 mg/dL (ref 0.44–1.00)
GFR, Estimated: >60 mL/min (ref >60)
Glucose, Bld: 95 mg/dL (ref 70–99)
Potassium: 3.2 mmol/L — ABNORMAL LOW (ref 3.5–5.1)
Sodium: 135 mmol/L (ref 135–145)

## 2021-05-10 LAB — I-STAT BETA HCG BLOOD, ED (MC, WL, AP ONLY): I-stat hCG, quantitative: <5 m[IU]/mL (ref <5)

## 2021-05-10 LAB — CBC
HCT: 41.5 % (ref 36.0–46.0)
Hemoglobin: 13.2 g/dL (ref 12.0–15.0)
MCH: 28.3 pg (ref 26.0–34.0)
MCHC: 31.8 g/dL (ref 30.0–36.0)
MCV: 89.1 fL (ref 80.0–100.0)
Platelets: 294 10*3/uL (ref 150–400)
RBC: 4.66 MIL/uL (ref 3.87–5.11)
RDW: 12.3 % (ref 11.5–15.5)
WBC: 8.8 10*3/uL (ref 4.0–10.5)
nRBC: 0 % (ref 0.0–0.2)

## 2021-05-10 LAB — TROPONIN I (HIGH SENSITIVITY)
Troponin I (High Sensitivity): <2 ng/L (ref <18)
Troponin I (High Sensitivity): <2 ng/L (ref <18)

## 2021-05-10 MED ORDER — POTASSIUM CHLORIDE CRYS ER 20 MEQ PO TBCR
40.0000 meq | EXTENDED_RELEASE_TABLET | Freq: Once | ORAL | Status: AC
Start: 2021-05-10 — End: 2021-05-10
  Administered 2021-05-10: 04:00:00 40 meq via ORAL
  Filled 2021-05-10: qty 2

## 2021-05-10 MED ORDER — ONDANSETRON HCL 4 MG/2ML IJ SOLN
4.0000 mg | Freq: Once | INTRAMUSCULAR | Status: AC
  Administered 2021-05-10: 04:00:00 4 mg via INTRAVENOUS
  Filled 2021-05-10: qty 2

## 2021-05-10 MED ORDER — PROMETHAZINE HCL 25 MG PO TABS: 25.0000 mg | ORAL_TABLET | Freq: Four times a day (QID) | ORAL | 0 refills | Status: DC | PRN

## 2021-05-10 NOTE — Discharge Instructions (Addendum)
Please read and follow all provided instructions. ? ?Your diagnoses today include:  ?1. Chest discomfort   ?2. Acute nonintractable headache, unspecified headache type   ?3. Nausea and vomiting, unspecified vomiting type   ? ? ?Tests performed today include: ?An EKG of your heart: no significant changes from previous ?A chest x-ray: Was clear without any lung problems ?Cardiac enzymes - a blood test for heart muscle damage, were checked twice and were both negative ?Blood counts and electrolytes -shows slightly low potassium ?Vital signs. See below for your results today.  ? ?Medications prescribed:  ?Phenergan (promethazine) - for nausea and vomiting ? ?Take any prescribed medications only as directed. ? ?Follow-up instructions: ?Please follow-up with your cardiologist as planned for continued work-up.  Please follow-up with your primary care doctor if you have recurrent headaches. ? ?Return instructions:  ?SEEK IMMEDIATE MEDICAL ATTENTION IF: ?You have severe chest pain, especially if the pain is crushing or pressure-like and spreads to the arms, back, neck, or jaw, or if you have sweating, nausea or vomiting, or trouble with breathing. THIS IS AN EMERGENCY. Do not wait to see if the pain will go away. Get medical help at once. Call 911. DO NOT drive yourself to the hospital.  ?Your chest pain gets worse and does not go away after a few minutes of rest.  ?You have an attack of chest pain lasting longer than what you usually experience.  ?You have significant dizziness, if you pass out, or have trouble walking.  ?You have chest pain not typical of your usual pain for which you originally saw your caregiver.  ?You have any other emergent concerns regarding your health. ? ?Additional Information: ?Chest pain comes from many different causes. Your caregiver has diagnosed you as having chest pain that is not specific for one problem, but does not require admission.  You are at low risk for an acute heart condition or  other serious illness.  ? ?Your vital signs today were: ?BP 121/84   Pulse 75   Temp 98.2 ?F (36.8 ?C) (Oral)   Resp 20   Ht 5\' 1"  (1.549 m)   Wt 58.5 kg   LMP 04/19/2021 (Exact Date)   SpO2 100%   BMI 24.37 kg/m?  ?If your blood pressure (BP) was elevated above 135/85 this visit, please have this repeated by your doctor within one month. ?-------------- ? ? ?

## 2021-05-10 NOTE — ED Provider Notes (Signed)
[facility] DEPT Provider Note   CSN: [id number] Arrival date & time: 05/10/21  [id number]     History  Chief Complaint  Patient presents with   Arm Pain   Chest Pain   Headache    Kristina Blair is a 26 y.o. female.  Patient with history of SVT followed by Dr. Harrell Gave of cardiology presents to the emergency department for evaluation of chest pain, neck pain, left arm pain and headache that started about 2 hours prior to my exam.  Patient states that she was lying in bed when the symptoms started.  She has been seen in the emergency department several times over the past 1 to 2 months after having a syncopal episode while doing [employer].  She has an echocardiogram scheduled for next week.  She has not yet seen a cardiologist but has an appointment later in the month.  She denies associated shortness of breath, cough.  She has been screened for DVT/PE with D-dimer in the past month which was negative.  She has a family history of hypertension and a grandfather who died in her early 42s.  Headache started at the same time as the chest pain.  She had an associated episode of vomiting.  No abdominal pain or diarrhea.  No treatments prior to arrival.      Home Medications Prior to Admission medications   Medication Sig Start Date End Date Taking? Authorizing Provider  diphenhydrAMINE HCl, Sleep, ([employer] SLEEPAID, DIPHENHYDRAMINE, PO) Take by mouth. AS NEEDED ONLY AT NIGHT    [provider]      Allergies    Kiwi extract    Review of Systems   Review of Systems  Physical Exam Updated Vital Signs BP (!) 138/94 (BP Location: Left Arm)    Pulse (!) 115    Temp 98.2 F (36.8 C) (Oral)    Resp 16    Ht 5\' 1"  (1.549 m)    Wt 58.5 kg    LMP 04/19/2021 (Exact Date)    SpO2 100%    BMI 24.37 kg/m   Physical Exam Vitals and nursing note reviewed.  Constitutional:      Appearance: She is well-developed. She is not diaphoretic.  HENT:     Head:  Normocephalic and atraumatic.     Mouth/Throat:     Mouth: Mucous membranes are not dry.  Eyes:     Conjunctiva/sclera: Conjunctivae normal.  Neck:     Vascular: Normal carotid pulses. No JVD.     Trachea: Trachea normal. No tracheal deviation.  Cardiovascular:     Rate and Rhythm: Normal rate and regular rhythm.     Pulses: No decreased pulses.          Radial pulses are 2+ on the right side and 2+ on the left side.     Heart sounds: Normal heart sounds, S1 normal and S2 normal. No murmur heard.    Comments: No loud murmurs heard at time of exam.  Heart rate 100. Pulmonary:     Effort: Pulmonary effort is normal. No respiratory distress.     Breath sounds: No wheezing.  Chest:     Chest wall: No tenderness.  Abdominal:     General: Bowel sounds are normal.     Palpations: Abdomen is soft.     Tenderness: There is no abdominal tenderness. There is no guarding or rebound.  Musculoskeletal:        General: Normal range of motion.     Cervical  back: Normal range of motion and neck supple. No muscular tenderness.     Right lower leg: No tenderness. No edema.     Left lower leg: No tenderness. No edema.  Skin:    General: Skin is warm and dry.     Coloration: Skin is not pale.  Neurological:     Mental Status: She is alert.    ED Results / Procedures / Treatments   Labs (all labs ordered are listed, but only abnormal results are displayed) Labs Reviewed  BASIC METABOLIC PANEL - Abnormal; Notable for the following components:      Result Value   Potassium 3.2 (*)    All other components within normal limits  CBC  I-STAT BETA HCG BLOOD, ED ([facility], [facility], [facility] ONLY)  TROPONIN I (HIGH SENSITIVITY)  TROPONIN I (HIGH SENSITIVITY)    ED ECG REPORT   Date: 05/10/2021  Rate: 91  Rhythm: normal sinus rhythm  QRS Axis: normal  Intervals: normal  ST/T Wave abnormalities: nonspecific T wave changes  Conduction Disutrbances:none  Narrative Interpretation:   Old EKG Reviewed: changes  noted, T wave abnormality present today but more prominent on EKG from 05/04/2021.  I have personally reviewed the EKG tracing and agree with the computerized printout as noted.   Radiology DG Chest 2 View  Result Date: 05/10/2021 CLINICAL DATA:  Chest pain for 3 months radiating into the left shoulder, initial encounter EXAM: CHEST - 2 VIEW COMPARISON:  05/04/2021 FINDINGS: The heart size and mediastinal contours are within normal limits. Both lungs are clear. The visualized skeletal structures are unremarkable. IMPRESSION: No active cardiopulmonary disease. Electronically Signed   By: Inez Catalina M.D.   On: 05/10/2021 03:14    Procedures Procedures    Medications Ordered in ED Medications - No data to display  ED Course/ Medical Decision Making/ A&P                           Medical Decision Making Amount and/or Complexity of Data Reviewed Labs: ordered. Radiology: ordered.  Risk Prescription drug management.   Patient seen and examined. History obtained directly from patient. Also reviewed labs and work-up from previous ED visits and cardiology visit for SVT.  Labs/EKG: EKG reviewed with results as above.  Cardiac follow-up ordered in triage.  Imaging: Ordered chest x-ray.  Medications/Fluids: None  Most recent vital signs reviewed and are as follows: BP (!) 138/94 (BP Location: Left Arm)    Pulse (!) 115    Temp 98.2 F (36.8 C) (Oral)    Resp 16    Ht 5\' 1"  (1.549 m)    Wt 58.5 kg    LMP 04/19/2021 (Exact Date)    SpO2 100%    BMI 24.37 kg/m   Initial impression: Atypical chest pain with headache.  6:28 AM Reassessment performed. Patient appears comfortable.  Still reports nausea.  I discussed that as she is driving home, I did not want to give her any other sedating medications.  We will give prescription for her to pick up and take at home.  She is in agreement with this.  Labs personally reviewed and interpreted including: CBC normal; BMP with Potassium 3.2;  troponin normal x2.   Imaging personally visualized and interpreted including: Chest x-ray, agree normal.  Reviewed pertinent lab work and imaging with patient at bedside. Questions answered.   Most current vital signs reviewed and are as follows: BP 124/82    Pulse 88  Temp 98.2 F (36.8 C) (Oral)    Resp (!) 22    Ht 5\' 1"  (1.549 m)    Wt 58.5 kg    LMP 04/19/2021 (Exact Date)    SpO2 100%    BMI 24.37 kg/m   Plan: Discharge to home.   Prescriptions written for: Phenergan  Other home care instructions discussed: Rest, potassium rich foods/multivitamin for hypokalemia.  ED return instructions discussed: Return and follow-up instructions: I encouraged patient to return to ED with severe chest pain, especially if the pain is crushing or pressure-like and spreads to the arms, back, neck, or jaw, or if they have associated sweating, vomiting, or shortness of breath with the pain, or significant pain with activity. We discussed that the evaluation here today indicates a low-risk of serious cause of chest pain, including heart trouble or a blood clot, but no evaluation is perfect and chest pain can evolve with time. The patient verbalized understanding and agreed.    Follow-up instructions discussed: Patient encouraged to follow-up with their PCP in 3 days and cardiology as planned.     For this patient's complaint of chest pain, the following emergent conditions were considered on the differential diagnosis: acute coronary syndrome, pulmonary embolism, pneumothorax, myocarditis, pericardial tamponade, aortic dissection, thoracic aortic aneurysm complication, esophageal perforation.   Other causes were also considered including: gastroesophageal reflux disease, musculoskeletal pain including costochondritis, pneumonia/pleurisy, herpes zoster, pericarditis.  In regards to possibility of ACS, patient has atypical features of pain, non-ischemic and unchanged EKG and negative troponin(s). Heart  score was calculated to be 1.   In regards to possibility of PE, symptoms are atypical for PE and risk profile is low, making PE low likelihood.   The patient's vital signs, pertinent lab work and imaging were reviewed and interpreted as discussed in the ED course. Hospitalization was considered for further testing, treatments, or serial exams/observation. However as patient is well-appearing, has a stable exam, and reassuring studies today, I do not feel that they warrant admission at this time. This plan was discussed with the patient who verbalizes agreement and comfort with this plan and seems reliable and able to return to the Emergency Department with worsening or changing symptoms.           Final Clinical Impression(s) / ED Diagnoses Final diagnoses:  Chest discomfort  Acute nonintractable headache, unspecified headache type  Nausea and vomiting, unspecified vomiting type    Rx / DC Orders ED Discharge Orders          Ordered    promethazine (PHENERGAN) 25 MG tablet  Every 6 hours PRN        05/10/21 0627              Carlisle Cater, PA-C 05/10/21 [id number]    Fatima Blank, MD 05/10/21 [phone]

## 2021-05-10 NOTE — ED Triage Notes (Addendum)
Pt reports with chest pain, left arm pain, and headache x 1 hr ago. Pt states that she is having an echo done tomorrow. Chest pain has been an ongoing issue for pt for months.  ?

## 2021-05-13 ENCOUNTER — Other Ambulatory Visit: Payer: Self-pay

## 2021-05-13 ENCOUNTER — Encounter (HOSPITAL_BASED_OUTPATIENT_CLINIC_OR_DEPARTMENT_OTHER): Payer: Self-pay | Admitting: *Deleted

## 2021-05-13 ENCOUNTER — Emergency Department (HOSPITAL_BASED_OUTPATIENT_CLINIC_OR_DEPARTMENT_OTHER): Payer: No Typology Code available for payment source

## 2021-05-13 ENCOUNTER — Emergency Department (HOSPITAL_BASED_OUTPATIENT_CLINIC_OR_DEPARTMENT_OTHER)
Admission: EM | Admit: 2021-05-13 | Discharge: 2021-05-14 | Disposition: A | Discharge status: Home / Self Care | Payer: No Typology Code available for payment source | Attending: Emergency Medicine | Admitting: Emergency Medicine

## 2021-05-13 DIAGNOSIS — R000 Tachycardia, unspecified: Secondary | ICD-10-CM

## 2021-05-13 DIAGNOSIS — R002 Palpitations: Secondary | ICD-10-CM

## 2021-05-13 DIAGNOSIS — R0602 Shortness of breath: Secondary | ICD-10-CM | POA: Insufficient documentation

## 2021-05-13 DIAGNOSIS — R519 Headache, unspecified: Secondary | ICD-10-CM | POA: Diagnosis not present

## 2021-05-13 LAB — CBC WITH DIFFERENTIAL/PLATELET
Abs Immature Granulocytes: 0.01 10*3/uL (ref 0.00–0.07)
Basophils Absolute: 0 10*3/uL (ref 0.0–0.1)
Basophils Relative: 0 %
Eosinophils Absolute: 0.1 10*3/uL (ref 0.0–0.5)
Eosinophils Relative: 2 %
HCT: 39.1 % (ref 36.0–46.0)
Hemoglobin: 12.5 g/dL (ref 12.0–15.0)
Immature Granulocytes: 0 %
Lymphocytes Relative: 45 %
Lymphs Abs: 3.5 10*3/uL (ref 0.7–4.0)
MCH: 28.1 pg (ref 26.0–34.0)
MCHC: 32 g/dL (ref 30.0–36.0)
MCV: 87.9 fL (ref 80.0–100.0)
Monocytes Absolute: 0.8 10*3/uL (ref 0.1–1.0)
Monocytes Relative: 10 %
Neutro Abs: 3.3 10*3/uL (ref 1.7–7.7)
Neutrophils Relative %: 43 %
Platelets: 278 10*3/uL (ref 150–400)
RBC: 4.45 MIL/uL (ref 3.87–5.11)
RDW: 12.5 % (ref 11.5–15.5)
WBC: 7.8 10*3/uL (ref 4.0–10.5)
nRBC: 0 % (ref 0.0–0.2)

## 2021-05-13 NOTE — ED Triage Notes (Signed)
Pt states she has had a headache, tightness in her chest, "heartbeat is pounding in my ear", and shortness of breath. Took her bp tonight and it was 170/100, does not take BP medications. Called Cardiologist on call, told to come to ED for eval.  ?

## 2021-05-14 ENCOUNTER — Ambulatory Visit (INDEPENDENT_AMBULATORY_CARE_PROVIDER_SITE_OTHER): Payer: No Typology Code available for payment source

## 2021-05-14 ENCOUNTER — Encounter (HOSPITAL_BASED_OUTPATIENT_CLINIC_OR_DEPARTMENT_OTHER): Payer: Self-pay

## 2021-05-14 ENCOUNTER — Emergency Department (HOSPITAL_BASED_OUTPATIENT_CLINIC_OR_DEPARTMENT_OTHER): Payer: No Typology Code available for payment source

## 2021-05-14 DIAGNOSIS — R55 Syncope and collapse: Secondary | ICD-10-CM

## 2021-05-14 LAB — ECHOCARDIOGRAM COMPLETE
AR max vel: 1.7 cm2
AV Area VTI: 1.82 cm2
AV Area mean vel: 1.83 cm2
AV Mean grad: 2 mmHg
AV Peak grad: 4 mmHg
Ao pk vel: 1 m/s
Area-P 1/2: 6.27 cm2
S' Lateral: 2.12 cm

## 2021-05-14 LAB — BASIC METABOLIC PANEL
Anion gap: 9 (ref 5–15)
BUN: 9 mg/dL (ref 6–20)
CO2: 23 mmol/L (ref 22–32)
Calcium: 9.6 mg/dL (ref 8.9–10.3)
Chloride: 105 mmol/L (ref 98–111)
Creatinine, Ser: 0.79 mg/dL (ref 0.44–1.00)
GFR, Estimated: >60 mL/min (ref >60)
Glucose, Bld: 87 mg/dL (ref 70–99)
Potassium: 3.4 mmol/L — ABNORMAL LOW (ref 3.5–5.1)
Sodium: 137 mmol/L (ref 135–145)

## 2021-05-14 LAB — LIPID PANEL
Chol/HDL Ratio: 2.9 ratio (ref 0.0–4.4)
Cholesterol, Total: 188 mg/dL (ref 100–199)
HDL: 64 mg/dL (ref >39)
LDL Chol Calc (NIH): 111 mg/dL — ABNORMAL HIGH (ref 0–99)
Triglycerides: 69 mg/dL (ref 0–149)
VLDL Cholesterol Cal: 13 mg/dL (ref 5–40)

## 2021-05-14 LAB — D-DIMER, QUANTITATIVE: D-Dimer, Quant: 0.32 ug/mL-FEU (ref 0.00–0.50)

## 2021-05-14 MED ORDER — POTASSIUM CHLORIDE CRYS ER 20 MEQ PO TBCR
40.0000 meq | EXTENDED_RELEASE_TABLET | Freq: Once | ORAL | Status: AC
Start: 2021-05-14 — End: 2021-05-14
  Administered 2021-05-14: 40 meq via ORAL
  Filled 2021-05-14: qty 2

## 2021-05-14 NOTE — ED Provider Notes (Signed)
?[facility] DEPT ?Provider Note ? ? ?CSN: [id number] ?Arrival date & time: 05/13/21  2315 ? ?  ? ?History ? ?No chief complaint on file. ? ? ?Kristina Blair is a 26 y.o. female. ? ?HPI ? ?  ? ?This is a 26 year old female with a history of SVT who presents with recurrent palpitations, shortness of breath, headache.  Patient reports that she had acute onset of symptoms tonight.  She took her blood pressure and it was 170/110.  This alarmed her.  She has no history of high blood pressure.  She was directed to the emergency department by her cardiology office.  She has been evaluated by Dr. Harrell Gave.  She has a appointment at the end of the month.  She is to have an echocardiogram tomorrow.  She previously has had an event monitor that had very reassuring results.  She has been in the emergency room 3 times since the end of February with varying symptoms related to tachycardia.  She has screened negative for PE and her work-up has been largely unremarkable.  On my evaluation, she states she feels somewhat better but has persistent headache and palpitations.  She states that these episodes do make her feel very uncomfortable.  She is inquiring whether there is any medications that she can take.  Patient states that she was resting when the symptoms came on.  She states that she is not particularly stressed out or anxious feeling.  Denies alcohol or drug use. ? ?Home Medications ?Prior to Admission medications   ?Medication Sig Start Date End Date Taking? Authorizing Provider  ?diphenhydrAMINE HCl, Sleep, ([employer] SLEEPAID, DIPHENHYDRAMINE, PO) Take by mouth. AS NEEDED ONLY AT NIGHT    [provider]  ?promethazine (PHENERGAN) 25 MG tablet Take 1 tablet (25 mg total) by mouth every 6 (six) hours as needed for nausea or vomiting. 05/10/21   Carlisle Cater, PA-C  ?   ? ?Allergies    ?Kiwi extract   ? ?Review of Systems   ?Review of Systems  ?Constitutional:  Negative for fever.  ?Respiratory:   Positive for shortness of breath.   ?Cardiovascular:  Positive for palpitations.  ?Neurological:  Positive for headaches.  ?All other systems reviewed and are negative. ? ?Physical Exam ?Updated Vital Signs ?BP 111/69   Pulse 84   Temp 99 ?F (37.2 ?C)   Resp 18   LMP 05/13/2021 (Exact Date)   SpO2 100%  ?Physical Exam ?Vitals and nursing note reviewed.  ?Constitutional:   ?   Appearance: She is well-developed. She is not ill-appearing.  ?HENT:  ?   Head: Normocephalic and atraumatic.  ?   Mouth/Throat:  ?   Mouth: Mucous membranes are moist.  ?Eyes:  ?   Pupils: Pupils are equal, round, and reactive to light.  ?Cardiovascular:  ?   Rate and Rhythm: Regular rhythm. Tachycardia present.  ?   Heart sounds: Normal heart sounds.  ?Pulmonary:  ?   Effort: Pulmonary effort is normal. No respiratory distress.  ?   Breath sounds: No wheezing.  ?Abdominal:  ?   Palpations: Abdomen is soft.  ?   Tenderness: There is no abdominal tenderness.  ?Musculoskeletal:  ?   Cervical back: Neck supple.  ?   Right lower leg: No edema.  ?   Left lower leg: No edema.  ?Skin: ?   General: Skin is warm and dry.  ?Neurological:  ?   Mental Status: She is alert and oriented to person, place, and time.  ?  Psychiatric:     ?   Mood and Affect: Mood normal.  ? ? ?ED Results / Procedures / Treatments   ?Labs ?(all labs ordered are listed, but only abnormal results are displayed) ?Labs Reviewed  ?BASIC METABOLIC PANEL - Abnormal; Notable for the following components:  ?    Result Value  ? Potassium 3.4 (*)   ? All other components within normal limits  ?CBC WITH DIFFERENTIAL/PLATELET  ?D-DIMER, QUANTITATIVE  ? ? ?EKG ?None ? ?Radiology ?DG Chest Portable 1 View ? ?Result Date: 05/14/2021 ?CLINICAL DATA:  Shortness of breath, headache, tightness in the chest. EXAM: PORTABLE CHEST 1 VIEW COMPARISON:  05/10/2021 FINDINGS: The heart size and mediastinal contours are within normal limits. Both lungs are clear. The visualized skeletal structures are  unremarkable. IMPRESSION: No active disease. Electronically Signed   By: Lucienne Capers M.D.   On: 05/14/2021 00:19   ? ?Procedures ?Procedures  ? ? ?Medications Ordered in ED ?Medications  ?potassium chloride SA (KLOR-CON M) CR tablet 40 mEq (has no administration in time range)  ? ? ?ED Course/ Medical Decision Making/ A&P ?  ?                        ?Medical Decision Making ?Amount and/or Complexity of Data Reviewed ?Labs: ordered. ?Radiology: ordered. ? ? ?This patient presents to the ED for concern of palpitations, shortness of breath, headache, this involves an extensive number of treatment options, and is a complaint that carries with it a high risk of complications and morbidity.  The differential diagnosis includes tachycardia, SVT, less likely ACS, PE ? ?MDM:   ? ?This is a 26 year old female who presents with recurrent palpitations, shortness of breath.  Has a history of same.  Has had 3 prior ED visits for the same with fairly reassuring work-ups.  Has also had an outpatient event monitor that was reassuring.  He is due to have an echocardiogram tomorrow.  On initial evaluation pulse is 118; however on my evaluation, heart rate was sinus between 80s and 105.  Blood pressure has been normal here.  She does not appear particularly anxious.  She is quite distressed with her symptoms.  Repeat lab work notable for slightly low potassium at 3.4.  She was given a potassium pill.  Repeat D-dimer negative.  Doubt PE.  Do not feel this is consistent with ACS.  EKG does not show an arrhythmia.  Overall work-up reassuring.  I have reassured the patient.  I think echocardiogram is the right next step.  She may also benefit from rate controlling medications; however, given that she is a cardiology patient, would defer to cardiology for initiation of this medication.  I stressed avoidance of stimulant medications or caffeine.  She denies alcohol or drug use. ?(Labs, imaging) ? ?Labs: ?I Ordered, and personally  interpreted labs.  The pertinent results include: CBC, BMP, D-dimer ? ?Imaging Studies ordered: ?I ordered imaging studies including chest x-ray negative ?I independently visualized and interpreted imaging. ?I agree with the radiologist interpretation ? ?Additional history obtained from chart review.  External records from outside source obtained and reviewed including prior cardiology and ED visits ? ?Critical Interventions: ?Potassium ? ?Consultations: ?I requested consultation with the NA,  and discussed lab and imaging findings as well as pertinent plan - they recommend: N/A ? ?Cardiac Monitoring: ?The patient was maintained on a cardiac monitor.  I personally viewed and interpreted the cardiac monitored which showed an underlying rhythm of: Sinus  tachycardia/sinus rhythm2 ? ?Reevaluation: ?After the interventions noted above, I reevaluated the patient and found that they have :improved ? ? ?Considered admission for: Ongoing symptoms ? ?Social Determinants of Health: ?Lives independent ? ?Disposition: Discharge ? ?Co morbidities that complicate the patient evaluation ? ?Past Medical History:  ?Diagnosis Date  ? Allergy   ? Anxiety   ? [employer] 02/13/2020  ? ASCUS of cervix with negative high risk HPV   ? Asthma   ? [employer] 02/13/2020  ? Depression   ? [employer] 02/13/2020  ? GERD (gastroesophageal reflux disease)   ? [employer] 02/13/2020  ? IUD (intrauterine device) in place 09/2019  ? liletta  ? Paroxysmal SVT (supraventricular tachycardia) (HCC)   ?  ? ?Medicines ?Meds ordered this encounter  ?Medications  ? potassium chloride SA (KLOR-CON M) CR tablet 40 mEq  ?  ?I have reviewed the patients home medicines and have made adjustments as needed ? ?Problem List / ED Course: ?Problem List Items Addressed This Visit   ? ?  ? Other  ? Palpitations  ? ?Other Visit Diagnoses   ? ? Tachycardia    -  Primary  ? ?  ?  ? ? ? ? ? ? ? ? ? ? ? ? ?Final Clinical Impression(s) / ED Diagnoses ?Final diagnoses:  ?Tachycardia   ?Palpitations  ? ? ?Rx / DC Orders ?ED Discharge Orders   ? ? None  ? ?  ? ? ?  ?Merryl Hacker, MD ?05/14/21 [phone] ? ?

## 2021-05-14 NOTE — Discharge Instructions (Signed)
You were seen today for recurrence of your palpitations, shortness of breath, headache.  Your heart rate was slightly elevated.  I have reviewed your chart.  You have an echocardiogram scheduled for tomorrow which is appropriate.  You may be a candidate for some medications for rate reduction; however, given that you have cardiology follow-up, I would defer to them starting medications.  Avoid any stimulant medications or caffeine. ?

## 2021-05-15 NOTE — Telephone Encounter (Signed)
Follow up on echo results!  ?

## 2021-05-24 ENCOUNTER — Inpatient Hospital Stay: Payer: No Typology Code available for payment source | Admitting: Genetic Counselor

## 2021-05-24 ENCOUNTER — Inpatient Hospital Stay: Payer: No Typology Code available for payment source

## 2021-05-28 ENCOUNTER — Other Ambulatory Visit: Payer: Self-pay

## 2021-05-28 ENCOUNTER — Encounter (HOSPITAL_BASED_OUTPATIENT_CLINIC_OR_DEPARTMENT_OTHER): Payer: Self-pay

## 2021-05-28 ENCOUNTER — Ambulatory Visit: Payer: No Typology Code available for payment source | Admitting: Physician Assistant

## 2021-05-28 DIAGNOSIS — J45909 Unspecified asthma, uncomplicated: Secondary | ICD-10-CM | POA: Insufficient documentation

## 2021-05-28 DIAGNOSIS — R059 Cough, unspecified: Secondary | ICD-10-CM | POA: Diagnosis present

## 2021-05-28 DIAGNOSIS — B349 Viral infection, unspecified: Secondary | ICD-10-CM | POA: Insufficient documentation

## 2021-05-28 DIAGNOSIS — M542 Cervicalgia: Secondary | ICD-10-CM | POA: Insufficient documentation

## 2021-05-28 NOTE — ED Triage Notes (Signed)
Patient BIB [facility] from Home with Neck Pain and Headache. ? ?Symptoms have been present for approximately 5-6 days. Worsening since it began. ? ?Moderate Nausea and Emesis yesterday and Today. Mild to Moderate Diarrhea. 100.2 Temperature at Home. ? ?Patient was at another Facility on Saturday and told it was a Muscle Strain. Patient believes it is Meningitis due to "Outbreak" at [facility]. ? ?NAD Noted during Triage. BIB Wheelchair/Stretcher. A&Ox4. GCS 15. ?

## 2021-05-29 ENCOUNTER — Emergency Department (HOSPITAL_BASED_OUTPATIENT_CLINIC_OR_DEPARTMENT_OTHER)
Admission: EM | Admit: 2021-05-29 | Discharge: 2021-05-29 | Disposition: A | Discharge status: Home / Self Care | Payer: No Typology Code available for payment source | Attending: Emergency Medicine | Admitting: Emergency Medicine

## 2021-05-29 DIAGNOSIS — B349 Viral infection, unspecified: Secondary | ICD-10-CM

## 2021-05-29 MED ORDER — ACETAMINOPHEN 500 MG PO TABS
1000.0000 mg | ORAL_TABLET | Freq: Once | ORAL | Status: AC
  Administered 2021-05-29: 1000 mg via ORAL
  Filled 2021-05-29: qty 2

## 2021-05-29 MED ORDER — PROCHLORPERAZINE MALEATE 10 MG PO TABS
10.0000 mg | ORAL_TABLET | Freq: Once | ORAL | Status: AC
Start: 2021-05-29 — End: 2021-05-29
  Administered 2021-05-29: 10 mg via ORAL
  Filled 2021-05-29: qty 1

## 2021-05-29 MED ORDER — DIPHENHYDRAMINE HCL 25 MG PO CAPS
25.0000 mg | ORAL_CAPSULE | Freq: Once | ORAL | Status: AC
  Administered 2021-05-29: 25 mg via ORAL
  Filled 2021-05-29: qty 1

## 2021-05-29 NOTE — ED Provider Notes (Signed)
?[facility] ?[facility] Emergency Department ?Provider Note ?MRN:  [id number]  ?Arrival date & time: 05/29/21    ? ?Chief Complaint   ?Neck Pain ?  ?History of Present Illness   ?Kristina Blair is a 26 y.o. year-old female with no pertinent past medical history presenting to the ED with chief complaint of neck pain. ? ?5 or 6 days of cough, diarrhea, nausea, vomiting, fever, headache, neck pain.  Worried she has meningitis.  No chest pain or shortness of breath, no abdominal pain, no dysuria. ? ?Review of Systems  ?A thorough review of systems was obtained and all systems are negative except as noted in the HPI and PMH.  ? ?Patient's Health History   ? ?Past Medical History:  ?Diagnosis Date  ? Allergy   ? Anxiety   ? Phreesia 02/13/2020  ? ASCUS of cervix with negative high risk HPV   ? Asthma   ? Phreesia 02/13/2020  ? Depression   ? Phreesia 02/13/2020  ? GERD (gastroesophageal reflux disease)   ? Phreesia 02/13/2020  ? IUD (intrauterine device) in place 09/2019  ? liletta  ? Paroxysmal SVT (supraventricular tachycardia) (HCC)   ?  ?Past Surgical History:  ?Procedure Laterality Date  ? WISDOM TOOTH EXTRACTION    ?  ?Family History  ?Problem Relation Age of Onset  ? Diabetes Mother   ? Hypertension Mother   ? Breast cancer Mother 55  ?     recurrence age 77  ? Early death Mother   ? Hyperlipidemia Mother   ? Miscarriages / [place] Mother   ? Obesity Mother   ? Bipolar disorder Father   ? Cerebral palsy Brother   ? Epilepsy Brother   ? Diabetes Maternal Grandmother   ? Heart disease Maternal Grandmother   ? Hyperlipidemia Maternal Grandmother   ? Hypertension Maternal Grandmother   ? Cancer Maternal Grandfather   ?     leukemia  ? Cancer Maternal Aunt 64  ?     uterine cancer, passed age 50  ? Ovarian cancer Maternal Great-grandmother   ?     passed aroung age 43  ?  ?Social History  ? ?Socioeconomic History  ? Marital status: Single  ?  Spouse name: Not on file  ? Number of children: Not on file  ?  Years of education: Not on file  ? Highest education level: Not on file  ?Occupational History  ? Not on file  ?Tobacco Use  ? Smoking status: Never  ? Smokeless tobacco: Never  ?Vaping Use  ? Vaping Use: Never used  ?Substance and Sexual Activity  ? Alcohol use: Not Currently  ? Drug use: Never  ? Sexual activity: Never  ?Other Topics Concern  ? Not on file  ?Social History Narrative  ? Not on file  ? ?Social Determinants of Health  ? ?Financial Resource Strain: Not on file  ?Food Insecurity: Not on file  ?Transportation Needs: Not on file  ?Physical Activity: Not on file  ?Stress: Not on file  ?Social Connections: Not on file  ?Intimate Partner Violence: Not on file  ?  ? ?Physical Exam  ? ?Vitals:  ? 05/29/21 0130 05/29/21 0230  ?BP: (!) 124/91 118/80  ?Pulse: 90 79  ?Resp: 16 16  ?Temp:    ?SpO2: 98% 100%  ?  ?CONSTITUTIONAL: Well-appearing, NAD ?NEURO/PSYCH:  Alert and oriented x 3, normal and symmetric strength and sensation, normal coordination, normal speech, no meningismus ?EYES:  eyes equal and reactive ?ENT/NECK:  no  LAD, no JVD ?CARDIO: Regular rate, well-perfused, normal S1 and S2 ?PULM:  CTAB no wheezing or rhonchi ?GI/GU:  non-distended, non-tender ?MSK/SPINE:  No gross deformities, no edema ?SKIN:  no rash, atraumatic ? ? ?*Additional and/or pertinent findings included in MDM below ? ?Diagnostic and Interventional Summary  ? ? EKG Interpretation ? ?Date/Time:    ?Ventricular Rate:    ?PR Interval:    ?QRS Duration:   ?QT Interval:    ?QTC Calculation:   ?R Axis:     ?Text Interpretation:   ?  ? ?  ? ?Labs Reviewed - No data to display  ?No orders to display  ?  ?Medications  ?acetaminophen (TYLENOL) tablet 1,000 mg (1,000 mg Oral Given 05/29/21 0220)  ?diphenhydrAMINE (BENADRYL) capsule 25 mg (25 mg Oral Given 05/29/21 0220)  ?prochlorperazine (COMPAZINE) tablet 10 mg (10 mg Oral Given 05/29/21 0220)  ?  ? ?Procedures  /  Critical Care ?Procedures ? ?ED Course and Medical Decision Making  ?Initial  Impression and Ddx ?Myriad of symptoms seems consistent with a viral illness.  Patient has normal vital signs, normal neurological exam, does not have meningismus.  Benign abdomen, lungs clear, no increased work of breathing, no rash.  Nothing to suggest emergent process, appropriate for discharge. ? ?Past medical/surgical history that increases complexity of ED encounter: None ? ?Interpretation of Diagnostics ?Not applicable ? ?Patient Reassessment and Ultimate Disposition/Management ?Discharge home ? ?Patient management required discussion with the following services or consulting groups:  None ? ?Complexity of Problems Addressed ?Acute illness or injury that poses threat of life of bodily function ? ?Additional Data Reviewed and Analyzed ?Further history obtained from: ?Prior ED visit notes ? ?Additional Factors Impacting ED Encounter Risk ?None ? ?Barth Kirks. Sedonia Small, MD ?[facility] Emergency Medicine ?[email or web address] ? ?Final Clinical Impressions(s) / ED Diagnoses  ? ?  ICD-10-CM   ?1. Viral illness  B34.9   ?  ?  ?ED Discharge Orders   ? ? None  ? ?  ?  ? ?Discharge Instructions Discussed with and Provided to Patient:  ? ? ?Discharge Instructions   ? ?  ?You were evaluated in the Emergency Department and after careful evaluation, we did not find any emergent condition requiring admission or further testing in the hospital. ? ?Your exam/testing today is overall reassuring.  Symptoms seem to be due to a viral illness.  Recommend Tylenol and Motrin at home as needed, plenty of rest and fluids. ? ?Please return to the Emergency Department if you experience any worsening of your condition.   Thank you for allowing [place] to be a part of your care. ? ? ? ?  ?Maudie Flakes, MD ?05/29/21 [phone] ? ?

## 2021-05-29 NOTE — Discharge Instructions (Signed)
You were evaluated in the Emergency Department and after careful evaluation, we did not find any emergent condition requiring admission or further testing in the hospital. ? ?Your exam/testing today is overall reassuring.  Symptoms seem to be due to a viral illness.  Recommend Tylenol and Motrin at home as needed, plenty of rest and fluids. ? ?Please return to the Emergency Department if you experience any worsening of your condition.   Thank you for allowing [place] to be a part of your care. ?

## 2021-05-30 ENCOUNTER — Ambulatory Visit (HOSPITAL_BASED_OUTPATIENT_CLINIC_OR_DEPARTMENT_OTHER): Payer: No Typology Code available for payment source | Admitting: Cardiology

## 2021-05-30 ENCOUNTER — Encounter (HOSPITAL_BASED_OUTPATIENT_CLINIC_OR_DEPARTMENT_OTHER): Payer: Self-pay | Admitting: Family Medicine

## 2021-05-30 ENCOUNTER — Emergency Department (HOSPITAL_COMMUNITY)
Admission: EM | Admit: 2021-05-30 | Discharge: 2021-05-31 | Disposition: A | Discharge status: Home / Self Care | Payer: No Typology Code available for payment source | Source: Home / Self Care | Attending: Emergency Medicine | Admitting: Emergency Medicine

## 2021-05-30 ENCOUNTER — Other Ambulatory Visit: Payer: Self-pay

## 2021-05-30 ENCOUNTER — Encounter (HOSPITAL_COMMUNITY): Payer: Self-pay

## 2021-05-30 ENCOUNTER — Emergency Department (HOSPITAL_COMMUNITY): Payer: No Typology Code available for payment source

## 2021-05-30 ENCOUNTER — Emergency Department (HOSPITAL_COMMUNITY)
Admission: EM | Admit: 2021-05-30 | Discharge: 2021-05-30 | Disposition: A | Discharge status: Home / Self Care | Payer: No Typology Code available for payment source | Attending: Emergency Medicine | Admitting: Emergency Medicine

## 2021-05-30 ENCOUNTER — Ambulatory Visit (INDEPENDENT_AMBULATORY_CARE_PROVIDER_SITE_OTHER): Payer: No Typology Code available for payment source | Admitting: Family Medicine

## 2021-05-30 VITALS — BP 128/88 | HR 99 | Temp 98.7°F | Ht 61.0 in | Wt 128.4 lb

## 2021-05-30 DIAGNOSIS — J45909 Unspecified asthma, uncomplicated: Secondary | ICD-10-CM | POA: Insufficient documentation

## 2021-05-30 DIAGNOSIS — R55 Syncope and collapse: Secondary | ICD-10-CM | POA: Insufficient documentation

## 2021-05-30 DIAGNOSIS — K219 Gastro-esophageal reflux disease without esophagitis: Secondary | ICD-10-CM

## 2021-05-30 DIAGNOSIS — E162 Hypoglycemia, unspecified: Secondary | ICD-10-CM | POA: Diagnosis not present

## 2021-05-30 DIAGNOSIS — R002 Palpitations: Secondary | ICD-10-CM | POA: Diagnosis present

## 2021-05-30 DIAGNOSIS — Z8269 Family history of other diseases of the musculoskeletal system and connective tissue: Secondary | ICD-10-CM

## 2021-05-30 DIAGNOSIS — R0602 Shortness of breath: Secondary | ICD-10-CM | POA: Diagnosis not present

## 2021-05-30 DIAGNOSIS — R0789 Other chest pain: Secondary | ICD-10-CM | POA: Diagnosis not present

## 2021-05-30 DIAGNOSIS — E876 Hypokalemia: Secondary | ICD-10-CM | POA: Diagnosis not present

## 2021-05-30 DIAGNOSIS — K589 Irritable bowel syndrome without diarrhea: Secondary | ICD-10-CM

## 2021-05-30 DIAGNOSIS — R000 Tachycardia, unspecified: Secondary | ICD-10-CM | POA: Diagnosis not present

## 2021-05-30 DIAGNOSIS — R509 Fever, unspecified: Secondary | ICD-10-CM | POA: Diagnosis not present

## 2021-05-30 DIAGNOSIS — R110 Nausea: Secondary | ICD-10-CM | POA: Diagnosis not present

## 2021-05-30 HISTORY — DX: Syncope and collapse: R55

## 2021-05-30 LAB — CBC WITH DIFFERENTIAL/PLATELET
Abs Immature Granulocytes: 0.01 10*3/uL (ref 0.00–0.07)
Basophils Absolute: 0 10*3/uL (ref 0.0–0.1)
Basophils Relative: 1 %
Eosinophils Absolute: 0 10*3/uL (ref 0.0–0.5)
Eosinophils Relative: 0 %
HCT: 39.2 % (ref 36.0–46.0)
Hemoglobin: 12.8 g/dL (ref 12.0–15.0)
Immature Granulocytes: 0 %
Lymphocytes Relative: 30 %
Lymphs Abs: 1.8 10*3/uL (ref 0.7–4.0)
MCH: 28.8 pg (ref 26.0–34.0)
MCHC: 32.7 g/dL (ref 30.0–36.0)
MCV: 88.1 fL (ref 80.0–100.0)
Monocytes Absolute: 0.6 10*3/uL (ref 0.1–1.0)
Monocytes Relative: 10 %
Neutro Abs: 3.7 10*3/uL (ref 1.7–7.7)
Neutrophils Relative %: 59 %
Platelets: 325 10*3/uL (ref 150–400)
RBC: 4.45 MIL/uL (ref 3.87–5.11)
RDW: 12.6 % (ref 11.5–15.5)
WBC: 6.2 10*3/uL (ref 4.0–10.5)
nRBC: 0 % (ref 0.0–0.2)

## 2021-05-30 LAB — COMPREHENSIVE METABOLIC PANEL
ALT: 17 U/L (ref 0–44)
AST: 27 U/L (ref 15–41)
Albumin: 4.4 g/dL (ref 3.5–5.0)
Alkaline Phosphatase: 55 U/L (ref 38–126)
Anion gap: 9 (ref 5–15)
BUN: 10 mg/dL (ref 6–20)
CO2: 20 mmol/L — ABNORMAL LOW (ref 22–32)
Calcium: 9.8 mg/dL (ref 8.9–10.3)
Chloride: 107 mmol/L (ref 98–111)
Creatinine, Ser: 0.89 mg/dL (ref 0.44–1.00)
GFR, Estimated: >60 mL/min (ref >60)
Glucose, Bld: 135 mg/dL — ABNORMAL HIGH (ref 70–99)
Potassium: 3.1 mmol/L — ABNORMAL LOW (ref 3.5–5.1)
Sodium: 136 mmol/L (ref 135–145)
Total Bilirubin: 0.6 mg/dL (ref 0.3–1.2)
Total Protein: 8.4 g/dL — ABNORMAL HIGH (ref 6.5–8.1)

## 2021-05-30 LAB — TROPONIN I (HIGH SENSITIVITY)
Troponin I (High Sensitivity): 2 ng/L (ref <18)
Troponin I (High Sensitivity): <2 ng/L (ref <18)

## 2021-05-30 LAB — HCG, QUANTITATIVE, PREGNANCY: hCG, Beta Chain, Quant, S: <1 m[IU]/mL (ref <5)

## 2021-05-30 LAB — TSH: TSH: 0.877 u[IU]/mL (ref 0.350–4.500)

## 2021-05-30 LAB — D-DIMER, QUANTITATIVE: D-Dimer, Quant: 0.32 ug/mL-FEU (ref 0.00–0.50)

## 2021-05-30 LAB — MAGNESIUM: Magnesium: 1.9 mg/dL (ref 1.7–2.4)

## 2021-05-30 IMAGING — CR DG CHEST 2V
2 series · 2 of 2 positions shown · non-contrast
Comparison: Chest radiograph dated [DATE].

CLINICAL DATA: Chest pain and palpitation.

EXAM:
CHEST - 2 VIEW

[w chest pa]
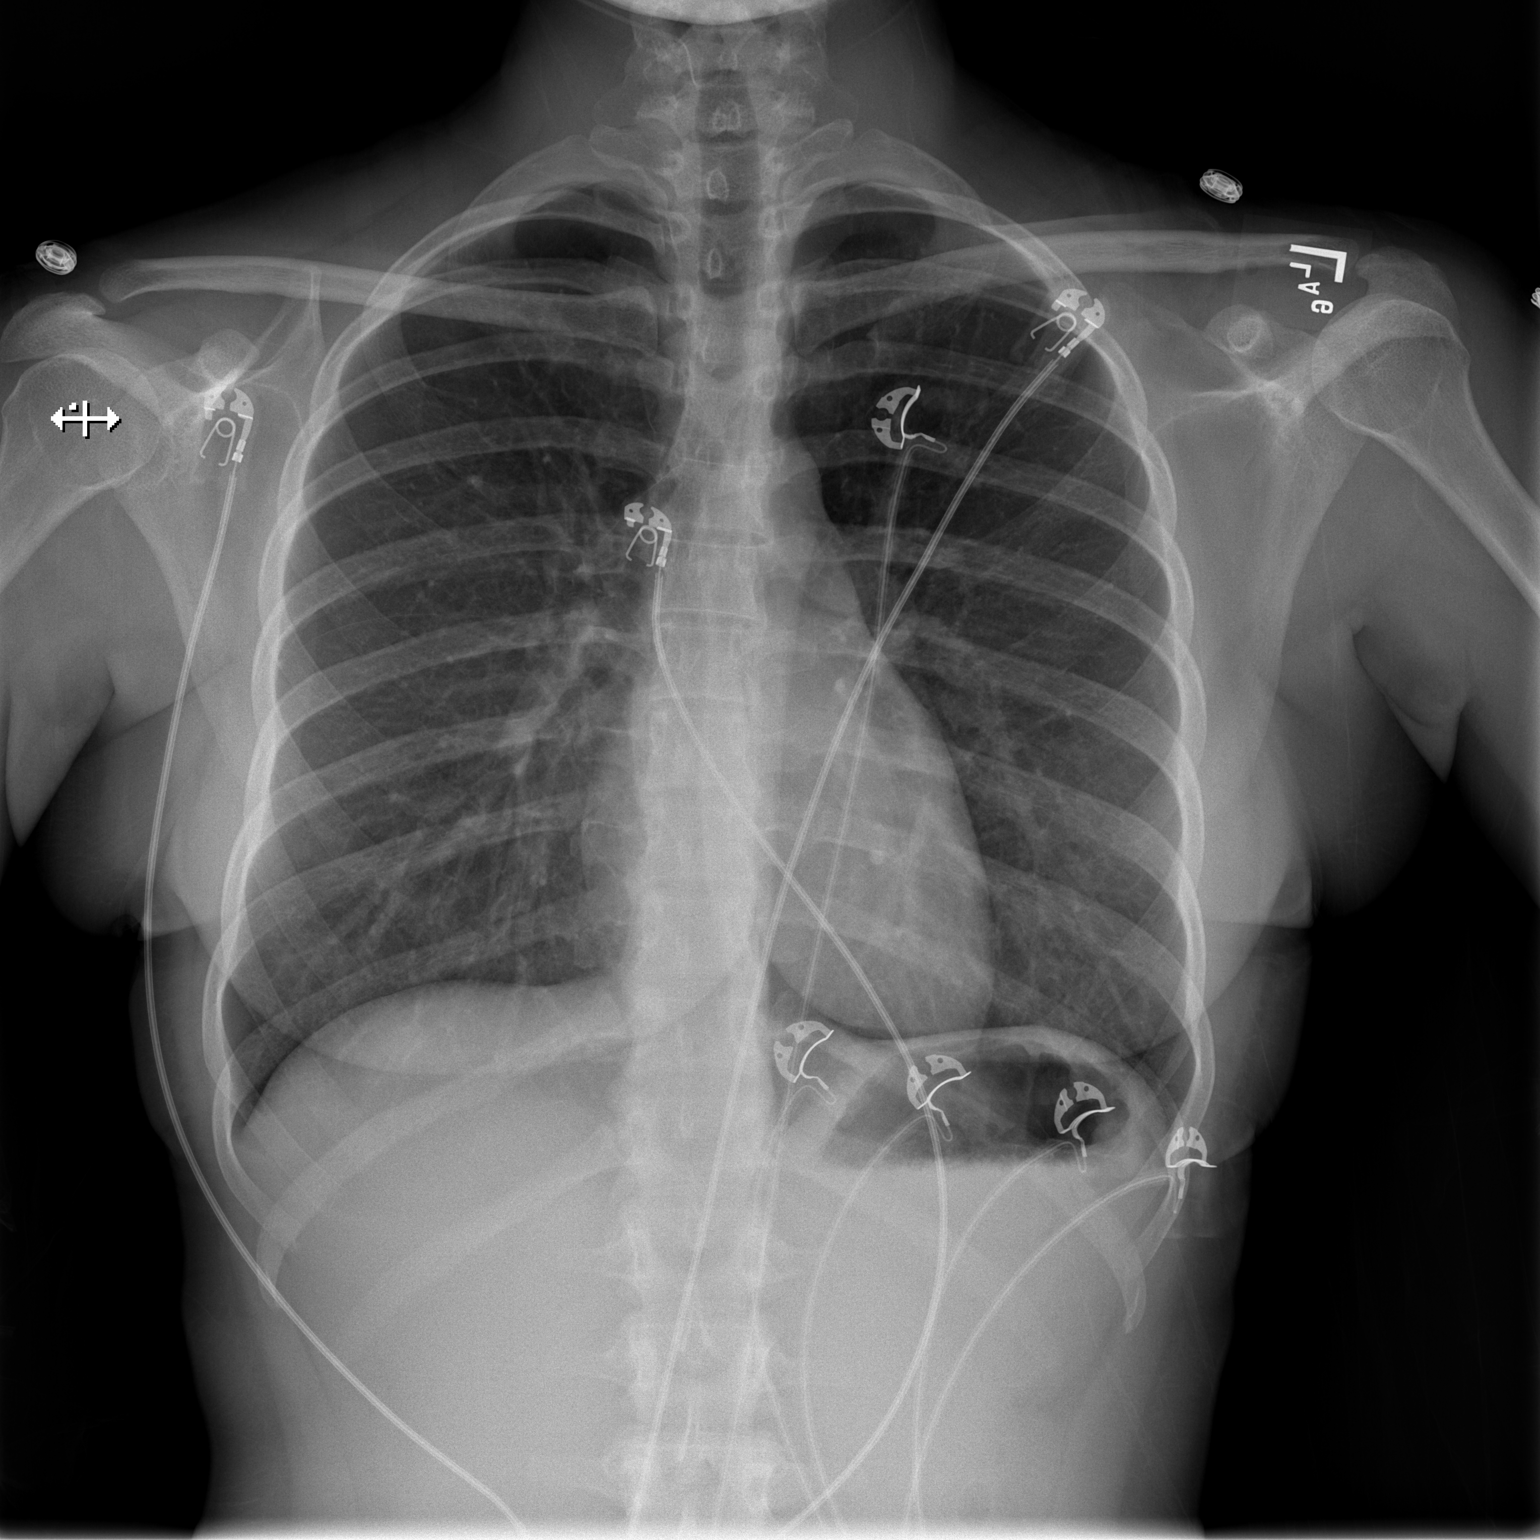

[w chest lat]
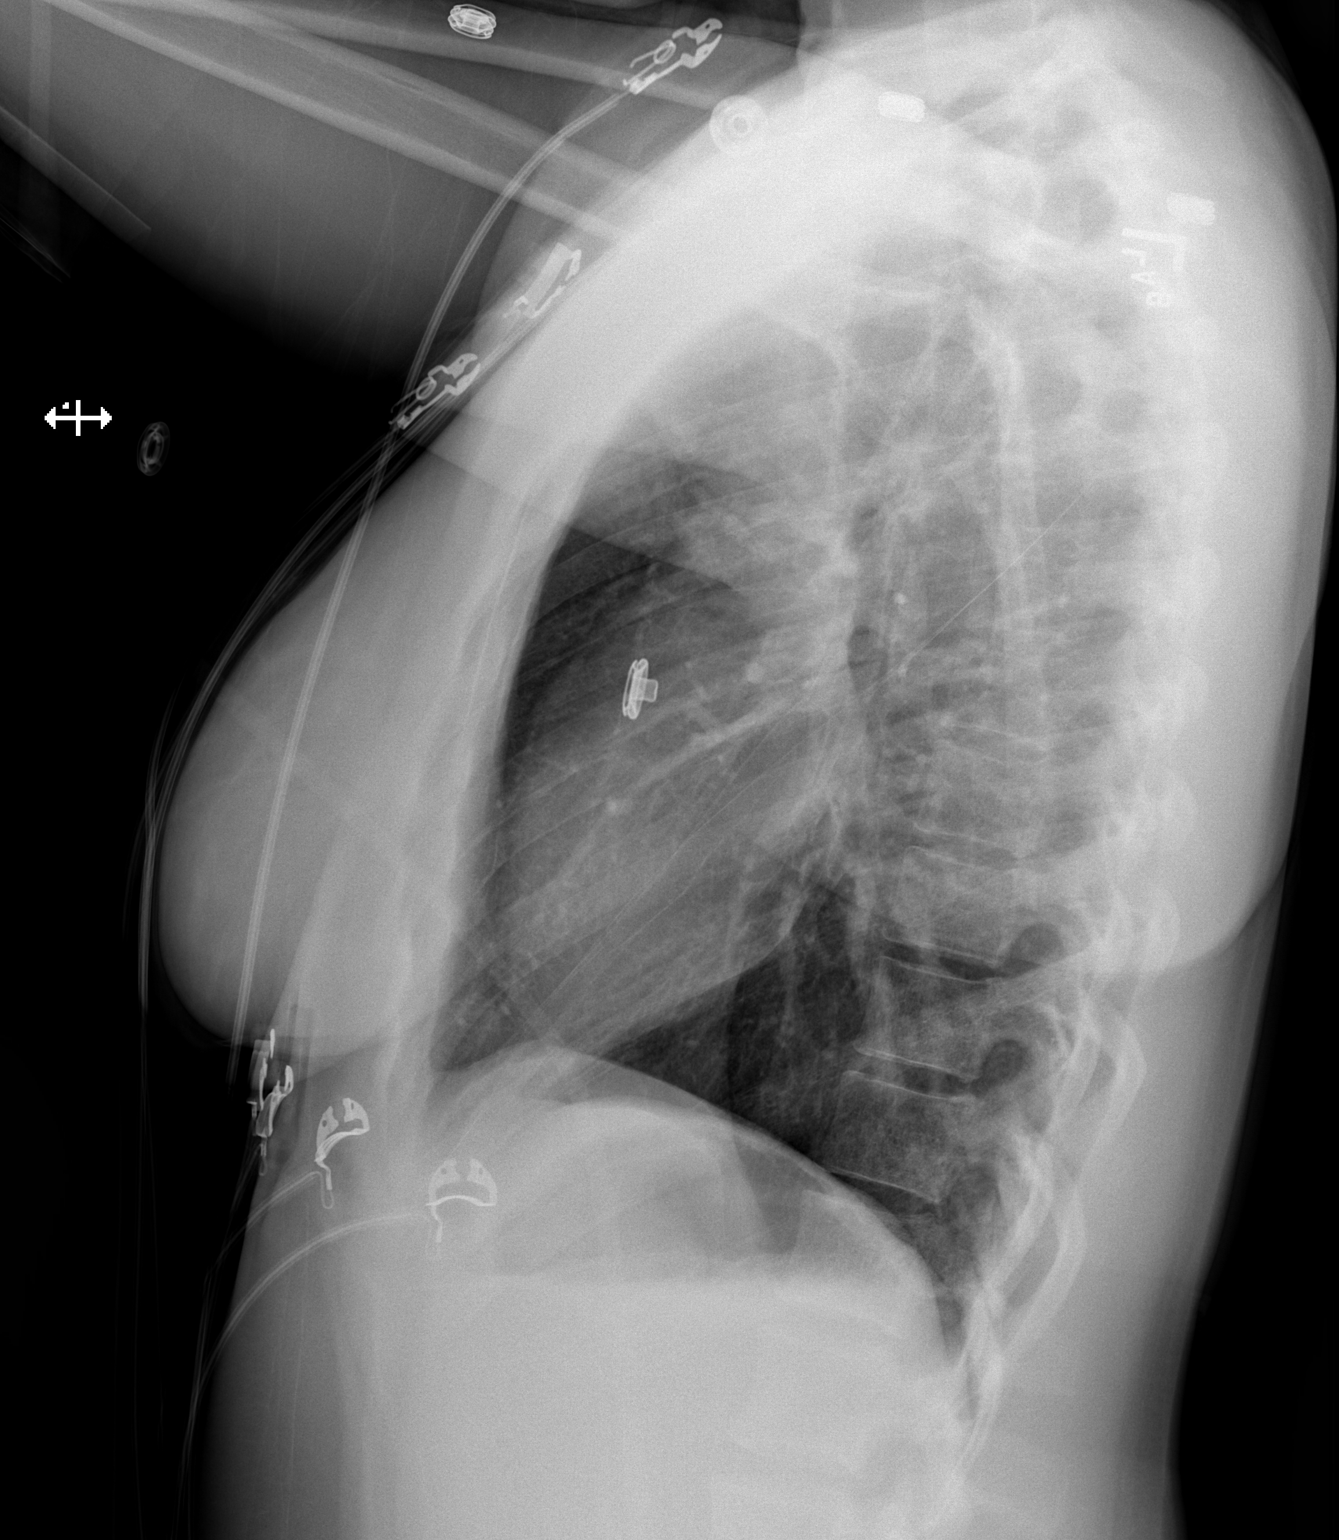

[2 of 2 positions shown; findings below may reference images not displayed]

FINDINGS: The heart size and mediastinal contours are within normal limits.
Both lungs are clear. The visualized skeletal structures are
unremarkable.
IMPRESSION: No active cardiopulmonary disease.

## 2021-05-30 MED ORDER — LIDOCAINE VISCOUS HCL 2 % MT SOLN
15.0000 mL | Freq: Once | OROMUCOSAL | Status: AC
  Administered 2021-05-30: 15 mL via ORAL
  Filled 2021-05-30: qty 15

## 2021-05-30 MED ORDER — POTASSIUM CHLORIDE CRYS ER 20 MEQ PO TBCR: 20.0000 meq | EXTENDED_RELEASE_TABLET | Freq: Every day | ORAL | 0 refills | Status: DC

## 2021-05-30 MED ORDER — SODIUM CHLORIDE 0.9 % IV BOLUS
2000.0000 mL | Freq: Once | INTRAVENOUS | Status: AC
Start: 2021-05-30 — End: 2021-05-30
  Administered 2021-05-30: 2000 mL via INTRAVENOUS

## 2021-05-30 MED ORDER — ALUM & MAG HYDROXIDE-SIMETH 200-200-20 MG/5ML PO SUSP
30.0000 mL | Freq: Once | ORAL | Status: AC
  Administered 2021-05-30: 30 mL via ORAL
  Filled 2021-05-30: qty 30

## 2021-05-30 MED ORDER — LORAZEPAM 2 MG/ML IJ SOLN
0.5000 mg | Freq: Once | INTRAMUSCULAR | Status: AC
  Administered 2021-05-30: 0.5 mg via INTRAVENOUS
  Filled 2021-05-30: qty 1

## 2021-05-30 MED ORDER — KETOROLAC TROMETHAMINE 15 MG/ML IJ SOLN
15.0000 mg | Freq: Once | INTRAMUSCULAR | Status: AC
  Administered 2021-05-30: 15 mg via INTRAVENOUS
  Filled 2021-05-30: qty 1

## 2021-05-30 MED ORDER — POTASSIUM CHLORIDE CRYS ER 20 MEQ PO TBCR
40.0000 meq | EXTENDED_RELEASE_TABLET | Freq: Once | ORAL | Status: AC
  Administered 2021-05-30: 40 meq via ORAL
  Filled 2021-05-30: qty 2

## 2021-05-30 NOTE — Assessment & Plan Note (Signed)
Patient has had several emergency department visits over the past few weeks.  These have been related to various issues including palpitations, chest discomfort, near syncopal episode.  The near syncopal episode was recent.  She was evaluated in the emergency department and work-up was generally reassuring.  She was advised to follow-up with cardiology as well as referred to neurology. ?Did review recent emergency department visit, documentation, labs and imaging. ?She was referred to [facility], we will also place referral to their office for patient to establish.  Recommend patient contact their office and discussed that she was seen in the emergency department and referred to them for further evaluation as this can likely allow for sooner visit ?

## 2021-05-30 NOTE — ED Triage Notes (Signed)
Reports chest tightness that has been worsening for several days. Also sts feeling a fast heart rate x 2 hours. When she checked it it would range from 112-125. Seen at [facility] yesterday and was told she has a virus.  ?

## 2021-05-30 NOTE — Discharge Instructions (Addendum)
You were seen in the ER today for almost passing out (near syncope) with abnormal heart beat/rate (palpitations).  ?Your potassium was mildly low at 3.1- normal is 3.5 to 5.1, please take the potassium supplement as prescribed over the next few days and include potassium rich foods in your diet. Please follow up with cardiology as well as neurology- we have provided information for the cardiologist you have seen previously as well as for a local neurology group. Additional follow up with your primary care provider.  ? ?Drink plenty of fluids.  ?Try to rest and get up slowly.  ? ?Return to the ER for new or worsening symptoms or any other concerns.  ?

## 2021-05-30 NOTE — ED Provider Notes (Signed)
?[place] DEPT ?Provider Note ? ? ?CSN: [id number] ?Arrival date & time: 05/30/21  0226 ? ?  ? ?History ? ?Chief Complaint  ?Patient presents with  ? Chest Pain  ? ? ?Kristina Blair is a 26 y.o. female with a history of asthma, anxiety, depression, paroxysmal SVT, GERD, and ADHD who presents to the ED with complaints of palpitations with near syncopal episode tonight.  Patient states that she had problems with palpitations for extended period of time now, often has rapid heart rate especially when up moving around with intermittent near syncopal events.  Over the past 1 month this has seem to become more prominent, she has had 5 episodes over the past month of near syncope when her heart rate gets elevated with position changes.  This occurred again tonight, she did not have loss of consciousness.  She also reports intermittent paresthesias to the digits and to her face.  With the palpitations she often has chest tightness as well as shortness of breath and nausea at times.  She had a fever a few days ago but none today. She denies leg pain/swelling, hemoptysis, recent surgery/trauma, recent long travel, OCP, personal hx of cancer, or hx of DVT/PE. She has seen cardiology outpatient. Denies recent lifestyle or med changes. Smokes marijuana denies additional drug use. Denies EtOH use.  ? ?HPI ? ?  ? ?Home Medications ?Prior to Admission medications   ?Medication Sig Start Date End Date Taking? Authorizing Provider  ?diphenhydrAMINE HCl, Sleep, ([facility] SLEEPAID, DIPHENHYDRAMINE, PO) Take by mouth. AS NEEDED ONLY AT NIGHT    [provider]  ?promethazine (PHENERGAN) 25 MG tablet Take 1 tablet (25 mg total) by mouth every 6 (six) hours as needed for nausea or vomiting. 05/10/21   Carlisle Cater, PA-C  ?   ? ?Allergies    ?Kiwi extract   ? ?Review of Systems   ?Review of Systems  ?Constitutional:  Positive for fever (a few days ago, none since).  ?Respiratory:  Positive for shortness of  breath.   ?Cardiovascular:  Positive for chest pain and palpitations.  ?Gastrointestinal:  Positive for nausea. Negative for abdominal pain and vomiting.  ?Neurological:  Positive for light-headedness and numbness.  ?All other systems reviewed and are negative. ? ?Physical Exam ?Updated Vital Signs ?BP (!) 145/96 (BP Location: Left Arm)   Pulse (!) 129   Temp 98 ?F (36.7 ?C) (Oral)   Resp 18   Ht 5\' 1"  (1.549 m)   Wt 58.5 kg   LMP 05/13/2021 (Exact Date)   SpO2 100%   BMI 24.37 kg/m?  ?Physical Exam ?Vitals and nursing note reviewed.  ?Constitutional:   ?   General: She is not in acute distress. ?   Appearance: She is well-developed. She is not toxic-appearing.  ?HENT:  ?   Head: Normocephalic and atraumatic.  ?Eyes:  ?   General:     ?   Right eye: No discharge.     ?   Left eye: No discharge.  ?   Conjunctiva/sclera: Conjunctivae normal.  ?Cardiovascular:  ?   Rate and Rhythm: Regular rhythm. Tachycardia present.  ?   Pulses:     ?     Radial pulses are 2+ on the right side and 2+ on the left side.  ?Pulmonary:  ?   Effort: No respiratory distress.  ?   Breath sounds: Normal breath sounds. No wheezing or rales.  ?Abdominal:  ?   General: There is no distension.  ?  Palpations: Abdomen is soft.  ?   Tenderness: There is no abdominal tenderness.  ?Musculoskeletal:  ?   Cervical back: Neck supple.  ?Skin: ?   General: Skin is warm and dry.  ?Neurological:  ?   Mental Status: She is alert.  ?   Comments: Clear speech. No facial droop. Cn III-XII grossly intact. Sensation grossly intact x 4. 5/5 strength with elbow flexion/extension, grip strength and ankle plantar/dorsiflexion bilaterally.   ?Psychiatric:     ?   Behavior: Behavior normal.  ? ? ?ED Results / Procedures / Treatments   ?Labs ?(all labs ordered are listed, but only abnormal results are displayed) ?Labs Reviewed  ?COMPREHENSIVE METABOLIC PANEL - Abnormal; Notable for the following components:  ?    Result Value  ? Potassium 3.1 (*)   ? CO2 20 (*)    ? Glucose, Bld 135 (*)   ? Total Protein 8.4 (*)   ? All other components within normal limits  ?CBC WITH DIFFERENTIAL/PLATELET  ?D-DIMER, QUANTITATIVE  ?TSH  ?MAGNESIUM  ?TROPONIN I (HIGH SENSITIVITY)  ?TROPONIN I (HIGH SENSITIVITY)  ? ? ?EKG ?EKG Interpretation ? ?Date/Time:  Wednesday May 30 2021 02:50:02 EDT ?Ventricular Rate:  139 ?PR Interval:  153 ?QRS Duration: 76 ?QT Interval:  280 ?QTC Calculation: 426 ?R Axis:   82 ?Text Interpretation: Sinus tachycardia Borderline repolarization abnormality Baseline wander in lead(s) aVL Confirmed by Randal Buba, April ([id number]) on 05/30/2021 2:56:28 AM ? ?Radiology ?DG Chest 2 View ? ?Result Date: 05/30/2021 ?CLINICAL DATA:  Chest pain and palpitation. EXAM: CHEST - 2 VIEW COMPARISON:  Chest radiograph dated 05/14/2021. FINDINGS: The heart size and mediastinal contours are within normal limits. Both lungs are clear. The visualized skeletal structures are unremarkable. IMPRESSION: No active cardiopulmonary disease. Electronically Signed   By: Anner Crete M.D.   On: 05/30/2021 03:55   ? ?Procedures ?Procedures  ? ? ?Medications Ordered in ED ?Medications - No data to display ? ?ED Course/ Medical Decision Making/ A&P ?  ?                        ?Medical Decision Making ?Amount and/or Complexity of Data Reviewed ?Labs: ordered. ?Radiology: ordered. ? ?Risk ?OTC drugs. ?Prescription drug management. ? ? ?Patient presents to the ED with complaints of near syncope and palpitaitons, this involves an extensive number of treatment options, and is a complaint that carries with it a high risk of complications and morbidity. Nontoxic, vitals w/ tachycardia on arrival. No focal neurologic deficits.  ? ?Additional history obtained:  ?Chart & nursing note reviewed.  ?Patient has been seen by primary care, the emergency department, and cardiology for similar complaints in the past.  She has also been evaluated in hospital systems in the [place] area. ? ?Patient hasEchocardiogram  05/2021:  ?1. Left ventricular ejection fraction, by estimation, is 60 to 65%. The  ?left ventricle has normal function. The left ventricle has no regional  ?wall motion abnormalities. Left ventricular diastolic parameters were  ?normal.  ? 2. Right ventricular systolic function is normal. The right ventricular  ?size is normal. There is normal pulmonary artery systolic pressure. The  ?estimated right ventricular systolic pressure is [id number] mmHg.  ? 3. The mitral valve is normal in structure. No evidence of mitral valve  ?regurgitation.  ? 4. The aortic valve is tricuspid. Aortic valve regurgitation is not  ?visualized. No aortic stenosis is present.  ? 5. The inferior vena cava is normal in size with greater  than 50%  ?respiratory variability, suggesting right atrial pressure of 3 mmHg.  ? ?Long term cardiac monitoring 03/2021:  ?Patient had a min HR of 46 bpm, max HR of 164 bpm, and avg HR of 75 bpm. Predominant underlying rhythm was Sinus Rhythm. Isolated SVEs were rare (<1.0%), SVE Couplets were rare (<1.0%), and no SVE Triplets were present. Isolated VEs were rare (<1.0%),  ?and no VE Couplets or VE Triplets were present ? ?EKG: Sinus tachycardia Borderline repolarization abnormality Baseline wander in lead(s) aVL  ? ?Orthostatic VS for the past 24 hrs: ? BP- Lying Pulse- Lying BP- Sitting Pulse- Sitting BP- Standing at 0 minutes Pulse- Standing at 0 minutes  ?05/30/21 0402 120/74 102 135/77 93 125/77 101  ? ?  ?Lab Tests:  ?I reviewed & interpreted labs including:  ?CBC: no critical anemia ?CMP: mild hypokalemia- oral replacement--> checked magnesium- WNL ?Troponin: WNL ?D-dimer: WNL ?TSH: WNL ?Preg test: Negative ? ?Imaging Studies ordered:  ?I ordered and viewed the following imaging, agree with radiologist impression:  ?No active cardiopulmonary disease.  ? ?ED Course:  ?I ordered medications including Toradol, GI cocktail, and Ativan all for possible symptomatic management.  I have also ordered 2 L of normal  saline for hydration. ? ?On re-assessment patient is resting comfortably.  ?Overall reassuring work-up in the emergency department today.  Patient has mild hypokalemia which will be orally replaced in the ED

## 2021-05-30 NOTE — Patient Instructions (Signed)
[facility] ?[address] [place], [place] [address] ?P: [phone] ? ?Syncope, Adult ?Syncope refers to a condition in which a person temporarily loses consciousness. Syncope may also be called fainting or passing out. It is caused by a sudden decrease in blood flow to the brain. This can happen for a variety of reasons. ?Most causes of syncope are not dangerous. It can be triggered by things such as needle sticks, seeing blood, pain, or intense emotion. However, syncope can also be a sign of a serious medical problem, such as a heart abnormality. Other causes can include dehydration, migraines, or taking medicines that lower blood pressure. Your health care provider may do tests to find the reason why you are having syncope. ?If you faint, get medical help right away. Call your local emergency services (911 in the [place] ?Follow these instructions at home: ?Pay attention to any changes in your symptoms. Take these actions to stay safe and to help relieve your symptoms: ?Knowing when you may be about to faint ?Signs that you may be about to faint include: ?Feeling dizzy, weak, light-headed, or like the room is spinning. ?Feeling nauseous. ?Seeing spots or seeing all white or all black in your field of vision. ?Having cold, clammy skin or feeling warm and sweaty. ?Hearing ringing in the ears (tinnitus). ?If you start to feel like you might faint, sit or lie down right away. If sitting, put your head down between your legs. If lying down, raise (elevate) your feet above the level of your heart. ?Breathe deeply and steadily. Wait until all the symptoms have passed. ?Have someone stay with you until you feel stable. ?Medicines ?Take over-the-counter and prescription medicines only as told by your health care provider. ?If you are taking blood pressure or heart medicine, get up slowly and take several minutes to sit and then stand. This can reduce dizziness and decrease the risk of  syncope. ?Lifestyle ?Do not drive, use machinery, or play sports until your health care provider says it is okay. ?Do not drink alcohol. ?Do not use any products that contain nicotine or tobacco. These products include cigarettes, chewing tobacco, and vaping devices, such as e-cigarettes. If you need help quitting, ask your health care provider. ?Avoid hot tubs and saunas. ?General instructions ?Talk with your health care provider about your symptoms. You may need to have testing to understand the cause of your syncope. ?Drink enough fluid to keep your urine pale yellow. ?Avoid prolonged standing. If you must stand for a long time, do movements such as: ?Moving your legs. ?Crossing your legs. ?Flexing and stretching your leg muscles. ?Squatting. ?Keep all follow-up visits. This is important. ?Contact a health care provider if: ?You have episodes of near fainting. ?Get help right away if: ?You faint. ?You hit your head or are injured after fainting. ?You have any of these symptoms that may indicate trouble with your heart: ?Fast or irregular heartbeats (palpitations). ?Unusual pain in your chest, abdomen, or back. ?Shortness of breath. ?You have a seizure. ?You have a severe headache. ?You are confused. ?You have vision problems. ?You have severe weakness or trouble walking. ?You are bleeding from your mouth or rectum, or you have black or tarry stool. ?These symptoms may represent a serious problem that is an emergency. Do not wait to see if your symptoms will go away. Get medical help right away. Call your local emergency services (911 in the [place] Do not drive yourself to the hospital. ?Summary ?Syncope refers to a condition in  which a person temporarily loses consciousness. Syncope may also be called fainting or passing out. It is caused by a sudden decrease in blood flow to the brain. ?Signs that you may be about to faint include dizziness, feeling light-headed, feeling nauseous, sudden vision changes, or cold,  clammy skin. ?Even though most causes of syncope are not dangerous, syncope can be a sign of a serious medical problem. Get help right away if you faint. ?If you start to feel like you might faint, sit or lie down right away. If sitting, put your head down between your legs. If lying down, raise (elevate) your feet above the level of your heart. ?This information is not intended to replace advice given to you by your health care provider. Make sure you discuss any questions you have with your health care provider. ?Document Revised: 06/29/2020 Document Reviewed: 06/29/2020 ?Elsevier Patient Education ? [place]. ? ?

## 2021-05-30 NOTE — Assessment & Plan Note (Signed)
Ongoing problem for patient, she has been following with cardiology.  She did have evaluation with cardiology including echocardiogram, heart monitor.  She reports that she has been having frequent issues related to palpitations, intermittent tachycardia, needing to present to emergency department due to the symptoms.  She reports that while she was wearing the heart monitor cardiology she did not have any issues with palpitations or heart racing for the entire week, but then these reportedly resumed after she finished with monitor. ?She does have follow-up with cardiology scheduled for later this week to review recent emergency department visits, discussed symptoms.  Encourage patient to continue with this ?

## 2021-05-30 NOTE — Progress Notes (Signed)
? ? ?  Procedures performed today:   ? ?None. ? ?Independent interpretation of notes and tests performed by another provider:  ? ?None. ? ?Brief History, Exam, Impression, and Recommendations:   ? ?BP 128/88   Pulse 99   Temp 98.7 ?F (37.1 ?C)   Ht 5\' 1"  (1.549 m)   Wt 128 lb 6.4 oz (58.2 kg)   LMP 05/13/2021 (Exact Date)   SpO2 99%   BMI 24.26 kg/m?  ? ?Near syncope ?Patient has had several emergency department visits over the past few weeks.  These have been related to various issues including palpitations, chest discomfort, near syncopal episode.  The near syncopal episode was recent.  She was evaluated in the emergency department and work-up was generally reassuring.  She was advised to follow-up with cardiology as well as referred to neurology. ?Did review recent emergency department visit, documentation, labs and imaging. ?She was referred to [facility], we will also place referral to their office for patient to establish.  Recommend patient contact their office and discussed that she was seen in the emergency department and referred to them for further evaluation as this can likely allow for sooner visit ? ?Palpitations ?Ongoing problem for patient, she has been following with cardiology.  She did have evaluation with cardiology including echocardiogram, heart monitor.  She reports that she has been having frequent issues related to palpitations, intermittent tachycardia, needing to present to emergency department due to the symptoms.  She reports that while she was wearing the heart monitor cardiology she did not have any issues with palpitations or heart racing for the entire week, but then these reportedly resumed after she finished with monitor. ?She does have follow-up with cardiology scheduled for later this week to review recent emergency department visits, discussed symptoms.  Encourage patient to continue with this ? ?Hypoglycemia ?Patient has been seeing [facility]  related to this.  Able to see documentation of encounters, however cannot review any notes from these encounters.  Will request records to review any labs and work-up which has been completed related to this ? ?Spent 35 minutes on this patient encounter, including preparation, chart review, face-to-face counseling with patient and coordination of care, and documentation of encounter ? ?Plan for follow-up in about 2 months or sooner as needed ? ? ?___________________________________________ ?Jeyda Siebel de Gayla Medicus, MD, ABFM, CAQSM ?Primary Care and Sports Medicine ?[facility] ?

## 2021-05-30 NOTE — ED Triage Notes (Signed)
Pt seen yesterday for elevated HR, states today she had a syncopal episode In the bathroom. Pt endorses headache. States her HR has been 120 resting and as high as 170 with exertion all day.  ?

## 2021-05-30 NOTE — Assessment & Plan Note (Signed)
Patient has been seeing Dr. Gayla Medicus related to this.  Able to see documentation of encounters, however cannot review any notes from these encounters.  Will request records to review any labs and work-up which has been completed related to this ?

## 2021-05-31 ENCOUNTER — Emergency Department (HOSPITAL_COMMUNITY): Payer: No Typology Code available for payment source

## 2021-05-31 LAB — CBC
HCT: 38.9 % (ref 36.0–46.0)
Hemoglobin: 12.5 g/dL (ref 12.0–15.0)
MCH: 29.1 pg (ref 26.0–34.0)
MCHC: 32.1 g/dL (ref 30.0–36.0)
MCV: 90.7 fL (ref 80.0–100.0)
Platelets: 321 10*3/uL (ref 150–400)
RBC: 4.29 MIL/uL (ref 3.87–5.11)
RDW: 12.9 % (ref 11.5–15.5)
WBC: 6.2 10*3/uL (ref 4.0–10.5)
nRBC: 0 % (ref 0.0–0.2)

## 2021-05-31 LAB — BASIC METABOLIC PANEL
Anion gap: 7 (ref 5–15)
BUN: <5 mg/dL — ABNORMAL LOW (ref 6–20)
CO2: 23 mmol/L (ref 22–32)
Calcium: 9.5 mg/dL (ref 8.9–10.3)
Chloride: 107 mmol/L (ref 98–111)
Creatinine, Ser: 0.75 mg/dL (ref 0.44–1.00)
GFR, Estimated: >60 mL/min (ref >60)
Glucose, Bld: 114 mg/dL — ABNORMAL HIGH (ref 70–99)
Potassium: 3.4 mmol/L — ABNORMAL LOW (ref 3.5–5.1)
Sodium: 137 mmol/L (ref 135–145)

## 2021-05-31 LAB — TROPONIN I (HIGH SENSITIVITY): Troponin I (High Sensitivity): <2 ng/L (ref <18)

## 2021-05-31 MED ORDER — POTASSIUM CHLORIDE CRYS ER 20 MEQ PO TBCR
20.0000 meq | EXTENDED_RELEASE_TABLET | Freq: Once | ORAL | Status: AC
  Administered 2021-05-31: 20 meq via ORAL
  Filled 2021-05-31: qty 1

## 2021-05-31 MED ORDER — PROPRANOLOL HCL 10 MG PO TABS: 5.0000 mg | ORAL_TABLET | Freq: Three times a day (TID) | ORAL | 0 refills | Status: DC | PRN

## 2021-05-31 MED ORDER — PROPRANOLOL HCL 10 MG PO TABS
5.0000 mg | ORAL_TABLET | Freq: Once | ORAL | Status: AC
  Administered 2021-05-31: 5 mg via ORAL
  Filled 2021-05-31: qty 0.5

## 2021-05-31 NOTE — ED Provider Notes (Signed)
?[place] DEPT ?Provider Note ? ? ?CSN: [id number] ?Arrival date & time: 05/30/21  2340 ? ?  ? ?History ? ?Chief Complaint  ?Patient presents with  ? Loss of Consciousness  ? ? ?Kristina Blair is a 26 y.o. female. ? ?The history is provided by the patient and medical records.  ?Loss of Consciousness ?Kristina Blair is a 26 y.o. female who presents to the Emergency Department complaining of syncope.  She presents to the ED for evaluation following a syncopal event that occurred at 11pm.  She was walking to the bathroom and was found clothed and unconscious in the bathroom.  Hit her head.  Concern for seizure.  Pt does not recall the event.  She has been experiencing palpitations for the last month, seen yesterday for palpitations.  Currently has occipital HA.  Description of possible seizure is unavailable.  ? ?Feeling deja vu today.   ? ?Has been worked up for palpitations - etiology unclear at this point.   ? ?She gets sweats, chills, 4 syncopal events this month - last episode two weeks ago.  Gets pain behind her right eye.  Gets chest tightness.  Fatigue.  Takes no medication.  Gets photosensitivity.   ? ?Has seen Cardiology, PCP, Endocrinology.   ? ?Family hx DM, HTN, lupus, MS ?  ? ?Home Medications ?Prior to Admission medications   ?Medication Sig Start Date End Date Taking? Authorizing Provider  ?potassium chloride SA (KLOR-CON M) 20 MEQ tablet Take 1 tablet (20 mEq total) by mouth daily. 05/30/21   Petrucelli, Glynda Jaeger, PA-C  ?promethazine (PHENERGAN) 25 MG tablet Take 1 tablet (25 mg total) by mouth every 6 (six) hours as needed for nausea or vomiting. 05/10/21   Carlisle Cater, PA-C  ?   ? ?Allergies    ?Kiwi extract   ? ?Review of Systems   ?Review of Systems  ?Cardiovascular:  Positive for syncope.  ?All other systems reviewed and are negative. ? ?Physical Exam ?Updated Vital Signs ?BP 125/81 (BP Location: Left Arm)   Pulse 94   Temp 98.6 ?F (37 ?C) (Oral)   Resp 12   LMP  05/13/2021 (Exact Date)   SpO2 100%  ?Physical Exam ?Vitals and nursing note reviewed.  ?Constitutional:   ?   Appearance: She is well-developed.  ?HENT:  ?   Head: Normocephalic and atraumatic.  ?Cardiovascular:  ?   Rate and Rhythm: Normal rate and regular rhythm.  ?   Heart sounds: No murmur heard. ?Pulmonary:  ?   Effort: Pulmonary effort is normal. No respiratory distress.  ?   Breath sounds: Normal breath sounds.  ?Abdominal:  ?   Palpations: Abdomen is soft.  ?   Tenderness: There is no abdominal tenderness. There is no guarding or rebound.  ?Musculoskeletal:     ?   General: No tenderness.  ?   Cervical back: Neck supple.  ?Skin: ?   General: Skin is warm and dry.  ?Neurological:  ?   Mental Status: She is alert and oriented to person, place, and time.  ?   Comments: 5/5 strength in all four extremities.    ?Psychiatric:     ?   Behavior: Behavior normal.  ? ? ?ED Results / Procedures / Treatments   ?Labs ?(all labs ordered are listed, but only abnormal results are displayed) ?Labs Reviewed  ?BASIC METABOLIC PANEL - Abnormal; Notable for the following components:  ?    Result Value  ? Potassium 3.4 (*)   ?  Glucose, Bld 114 (*)   ? BUN <5 (*)   ? All other components within normal limits  ?CBC  ?TROPONIN I (HIGH SENSITIVITY)  ? ? ?EKG ?EKG Interpretation ? ?Date/Time:  Thursday May 31 2021 00:18:25 EDT ?Ventricular Rate:  107 ?PR Interval:  146 ?QRS Duration: 59 ?QT Interval:  375 ?QTC Calculation: 501 ?R Axis:   74 ?Text Interpretation: Sinus tachycardia Borderline T wave abnormalities Prolonged QT interval Baseline wander in lead(s) V4 Confirmed by Quintella Reichert [phone]) on 05/31/2021 12:46:09 AM ? ?Radiology ?DG Chest 2 View ? ?Result Date: 05/30/2021 ?CLINICAL DATA:  Chest pain and palpitation. EXAM: CHEST - 2 VIEW COMPARISON:  Chest radiograph dated 05/14/2021. FINDINGS: The heart size and mediastinal contours are within normal limits. Both lungs are clear. The visualized skeletal structures are  unremarkable. IMPRESSION: No active cardiopulmonary disease. Electronically Signed   By: Anner Crete M.D.   On: 05/30/2021 03:55   ? ?Procedures ?Procedures  ? ? ?Medications Ordered in ED ?Medications - No data to display ? ?ED Course/ Medical Decision Making/ A&P ?  ?                        ?Medical Decision Making ?Amount and/or Complexity of Data Reviewed ?Labs: ordered. ?Radiology: ordered. ? ?Risk ?Prescription drug management. ? ? ?Patient with issues with recurrent palpitations, 4 syncopal events this month here for evaluation following syncopal event at home.  Per family report there was concern for possible seizure activity but no description of this activity was available at time of ED presentation.  She did reach out to try and contact family members but they were unable to answer the phone.  No bowel or bladder incontinence during the event.  EKG with sinus tachycardia. It Is similar when compared to priors.  Labs without evidence of anemia.  She had a negative hCG yesterday, doubt ectopic.  Presentation is not consistent with PE, recent negative D-dimer.  She has had extensive outpatient work-up with echo, Holter monitor at home. Ct head without acute abnormality.  Current clinical picture not c/w arrhythmia, SAH, PE.  Labs with mild hypokalemia- oral replacement provided.  D/w pt unclear source of sxs, recommend ongoing outpatient workup.  Her pcp placed referral to Neurology.  Discussed no driving until sxs further evaluated.  Also recommend f/u Cardiology.  Will rx propranolol prn palpitations/anxiety.  Return precautions discussed.   ? ? ? ? ? ? ? ?Final Clinical Impression(s) / ED Diagnoses ?Final diagnoses:  ?None  ? ? ?Rx / DC Orders ?ED Discharge Orders   ? ? None  ? ?  ? ? ?  ?Quintella Reichert, MD ?05/31/21 [id number] ? ?

## 2021-06-01 ENCOUNTER — Ambulatory Visit (INDEPENDENT_AMBULATORY_CARE_PROVIDER_SITE_OTHER): Payer: No Typology Code available for payment source | Admitting: Family

## 2021-06-01 ENCOUNTER — Encounter (HOSPITAL_BASED_OUTPATIENT_CLINIC_OR_DEPARTMENT_OTHER): Payer: Self-pay | Admitting: Family

## 2021-06-01 VITALS — BP 124/84 | HR 90 | Ht 61.0 in | Wt 124.6 lb

## 2021-06-01 DIAGNOSIS — R000 Tachycardia, unspecified: Secondary | ICD-10-CM

## 2021-06-01 DIAGNOSIS — R55 Syncope and collapse: Secondary | ICD-10-CM

## 2021-06-01 DIAGNOSIS — R002 Palpitations: Secondary | ICD-10-CM

## 2021-06-01 MED ORDER — PROPRANOLOL HCL 10 MG PO TABS: 5.0000 mg | ORAL_TABLET | Freq: Three times a day (TID) | ORAL | 1 refills | Status: DC | PRN

## 2021-06-01 NOTE — Progress Notes (Addendum)
? ?Office Visit  ?  ?Patient Name: Kristina Blair ?Date of Encounter: 06/01/2021 ? ?PCP:  de [place]uba, Raymond J, MD ?  ?[facility]  ?Cardiologist:  Jodelle RedBridgette Christopher, MD  ?Advanced Practice Provider:  No care team member to display ?Electrophysiologist:  None  ?   ? ?Chief Complaint  ?  ?Kristina FakeSharon Christofferson is a 26 y.o. female with a hx of palpitations, syncope, SVT presents today for follow-up after ZIO monitor ? ?Past Medical History  ?  ?Past Medical History:  ?Diagnosis Date  ? Allergy   ? Anxiety   ? Phreesia 02/13/2020  ? ASCUS of cervix with negative high risk HPV   ? Asthma   ? Phreesia 02/13/2020  ? Depression   ? [employer]reesia 02/13/2020  ? GERD (gastroesophageal reflux disease)   ? Phreesia 02/13/2020  ? IUD (intrauterine device) in place 09/2019  ? liletta  ? Paroxysmal SVT (supraventricular tachycardia) (HCC)   ? ?Past Surgical History:  ?Procedure Laterality Date  ? WISDOM TOOTH EXTRACTION    ? ? ?Allergies ? ?Allergies  ?Allergen Reactions  ? Kiwi Extract Other (See Comments)  ?  Tongue itching ?Tongue itching  ? ? ?History of Present Illness  ?  ?Kristina FakeSharon Raudenbush is a 26 y.o. female with a hx of palpitations, SVT, syncope last seen 03/14/2023 after Cristal Deerhristopher. ? ?She had COVID January 2022 with subsequent onset of tachycardia, palpitations.  She was seen in consult by Dr. Cristal Deerhristopher 03/12/2021.  He noted episodes of syncope and near syncope.  EKGs routinely have shown sinus tachycardia but no significant arrhythmia.  Her Adderall has been discontinued with no change in symptoms.  She does avoid caffeine, alcohol, tobacco but does use marijuana.  She was undergoing work-up with endocrinology due to chills.  She was recommended for ZIO monitor. ? ?She has had multiple ED visits since last seen. ?04/24/2021 pain of calf with urinalysis, D-dimer unremarkable.   ?05/04/2021 chest pain in left and right chest as well as epigastric with work-up in the ED unremarkable. ?05/10/2021 chest discomfort, neck  pain, left arm pain which occurred while laying in bed work-up unremarkable ?05/13/2021 tachycardia ?05/27/2021 with neck pain provided with prescription for Zanaflex.   ?05/29/2021 with viral illness. ?05/30/2021 palpitations with mild hypokalemia repleted, D-dimer within normal limits.   ?05/30/21 syncopal episode hitting her head.  No bowel or bladder incontinence during event.  EKG in ED showed sinus tachycardia.  CT head with no acute abnormality.  She had mild hypokalemia and oral replacement provided.  She was provided as needed propanolol for palpitations/anxiety. ? ?Cardiac work-up includes ZIO monitor worn for 11 days 03/2021 with average heart rate 75 bpm, predominantly NSR, PVC/PAC less than 1% and no significant arrhythmia.  Her 13 triggered episodes were associated with sinus rhythm from 70s to the 90s.  Echo 05/14/2021 normal LVEF 60 to 65%, no RWMA, and normal diastolic parameters, RV normal size and function, no significant valve abnormalities. ? ?She presents today for follow-up with her grandmother who she lives with.  Follows a mostly heart healthy, diabetic diet due to her grandmother's medical conditions.  She has been referred to neurology by PCP though appointment not yet scheduled.  Reviewed ZIO and echocardiogram.  She is understandably frustrated that her cardiac work-up was unremarkable so she can does have symptoms.  He drinks Gatorade daily.  Encouraged to make position changes slowly.  We discussed that it would be safer to perform exercises such as walking on the treadmill or doing  weight lifting in a standing position rather than exercises like Burpee's that require quick position of changing her could prompt orthostasis.  Her orthostatic vitals today in clinic were negative.  She did feel improvement in her heart rate when she took the as needed propanolol as prescribed by ED provider.  We reviewed that this would not be safe if she decided to become pregnant. ? ?EKGs/Labs/Other Studies  Reviewed:  ? ?The following studies were reviewed today: ? ?Monitor 04/03/21 ?Patch Wear Time:  11 days and 12 hours (2023-01-10T09:23:49-0500 to 2023-01-21T21:52:16-0500) ?  ?Patient had a min HR of 46 bpm, max HR of 164 bpm, and avg HR of 75 bpm. Predominant underlying rhythm was Sinus Rhythm. Isolated SVEs were rare (<1.0%), SVE Couplets were rare (<1.0%), and no SVE Triplets were present. Isolated VEs were rare (<1.0%),  ?and no VE Couplets or VE Triplets were present.  ? ?Echo 05/14/21 ? 1. Left ventricular ejection fraction, by estimation, is 60 to 65%. The  ?left ventricle has normal function. The left ventricle has no regional  ?wall motion abnormalities. Left ventricular diastolic parameters were  ?normal.  ? 2. Right ventricular systolic function is normal. The right ventricular  ?size is normal. There is normal pulmonary artery systolic pressure. The  ?estimated right ventricular systolic pressure is 21.5 mmHg.  ? 3. The mitral valve is normal in structure. No evidence of mitral valve  ?regurgitation.  ? 4. The aortic valve is tricuspid. Aortic valve regurgitation is not  ?visualized. No aortic stenosis is present.  ? 5. The inferior vena cava is normal in size with greater than 50%  ?respiratory variability, suggesting right atrial pressure of 3 mmHg.  ? ?EKG: EKG ordered today.  The EKG ordered today while sitting demonstrated normal sinus rhythm 90 bpm.  EKG while standing demonstrates sinus tachycardia 111 bpm.  No significant ST/T wave changes.  No evidence of arrhythmia. ? ?Recent Labs: ?05/30/2021: ALT 17; Magnesium 1.9; TSH 0.877 ?05/31/2021: BUN <5; Creatinine, Ser 0.75; Hemoglobin 12.5; Platelets 321; Potassium 3.4; Sodium 137  ?Recent Lipid Panel ?   ?Component Value Date/Time  ? CHOL 188 05/14/2021 1045  ? TRIG 69 05/14/2021 1045  ? HDL 64 05/14/2021 1045  ? CHOLHDL 2.9 05/14/2021 1045  ? LDLCALC 111 (H) 05/14/2021 1045  ? ? ?Home Medications  ? ?Current Meds  ?Medication Sig  ? potassium chloride  SA (KLOR-CON M) 20 MEQ tablet Take 1 tablet (20 mEq total) by mouth daily.  ? promethazine (PHENERGAN) 25 MG tablet Take 1 tablet (25 mg total) by mouth every 6 (six) hours as needed for nausea or vomiting.  ? [DISCONTINUED] propranolol (INDERAL) 10 MG tablet Take 0.5 tablets (5 mg total) by mouth 3 (three) times daily as needed.  ?  ? ?Review of Systems  ?    ?All other systems reviewed and are otherwise negative except as noted above. ? ?Physical Exam  ?  ?VS:  BP 124/84 (BP Location: Right Arm, Patient Position: Sitting, Cuff Size: Normal)   Pulse 90   Ht 5\' 1"  (1.549 m)   Wt 124 lb 9.6 oz (56.5 kg)   LMP 05/13/2021 (Exact Date)   BMI 23.54 kg/m?  , BMI Body mass index is 23.54 kg/m?. ? ?Wt Readings from Last 3 Encounters:  ?06/01/21 124 lb 9.6 oz (56.5 kg)  ?05/30/21 128 lb 6.4 oz (58.2 kg)  ?05/30/21 129 lb (58.5 kg)  ?  ? ?GEN: Well nourished, well developed, in no acute distress. ?HEENT: normal. ?Neck: Supple,  no JVD, carotid bruits, or masses. ?Cardiac: RRR, no murmurs, rubs, or gallops. No clubbing, cyanosis, edema.  Radials/PT 2+ and equal bilaterally.  ?Respiratory:  Respirations regular and unlabored, clear to auscultation bilaterally. ?GI: Soft, nontender, nondistended. ?MS: No deformity or atrophy. ?Skin: Warm and dry, no rash. ?Neuro:  Strength and sensation are intact. ?Psych: Normal affect. ? ?Assessment & Plan  ?  ?Syncope / Palpitations - ZIO and echo unremarkable. Orthostatic vital signs today are negative. 05/2021 TSH WNL.  Has been referred to neurology for further evaluation. She did request refill of PRN Propranolol as it helped with sensation of tachycardia - discussed that could not use if she became pregnant.  ? ?Hypokalemia -repleted during recent ED visit.  Continue to follow with primary care. ? ?  ? ?Disposition: Follow up prn with Jodelle Red, MD or APP. ? ?Signed, ?Alver Sorrow, NP ?06/01/2021, 5:11 PM ?[facility] ?

## 2021-06-01 NOTE — Patient Instructions (Signed)
Medication Instructions:  ?Continue your current medications.  ? ?*If you need a refill on your cardiac medications before your next appointment, please call your pharmacy* ? ? ?Lab Work: ?None ordered today.  ? ?Testing/Procedures: ?Your EKG today showed normal sinus rhythm which is a good result.  ? ?Your ZIO monitor showed no significant arrhythmia.  ? ?Your echocardiogram showed normal heart function and valves. ? ?Follow-Up: ?At [facility], you and your health needs are our priority.  As part of our continuing mission to provide you with exceptional heart care, we have created designated Provider Care Teams.  These Care Teams include your primary Cardiologist (physician) and Advanced Practice Providers (APPs -  Physician Assistants and Nurse Practitioners) who all work together to provide you with the care you need, when you need it. ? ?We recommend signing up for the patient portal called "MyChart".  Sign up information is provided on this After Visit Summary.  MyChart is used to connect with patients for Virtual Visits (Telemedicine).  Patients are able to view lab/test results, encounter notes, upcoming appointments, etc.  Non-urgent messages can be sent to your provider as well.   ?To learn more about what you can do with MyChart, go to [email or web address].   ? ?Your next appointment:   ?As needed with Dr. Cristal Deer ? ? ?Other Instructions ? ?Continue to stay well hydrated.  ? ?Make position changes slowly.   ? ?Recommend follow up with neurology regarding syncope.  ?

## 2021-06-02 ENCOUNTER — Emergency Department (HOSPITAL_COMMUNITY): Payer: No Typology Code available for payment source

## 2021-06-02 ENCOUNTER — Inpatient Hospital Stay (HOSPITAL_COMMUNITY)
Admission: EM | Admit: 2021-06-02 | Discharge: 2021-06-05 | DRG: 395 | Disposition: A | Discharge status: Home / Self Care | Payer: No Typology Code available for payment source | Attending: Family Medicine | Admitting: Family Medicine

## 2021-06-02 ENCOUNTER — Other Ambulatory Visit: Payer: Self-pay

## 2021-06-02 ENCOUNTER — Encounter (HOSPITAL_COMMUNITY): Payer: Self-pay | Admitting: Emergency Medicine

## 2021-06-02 DIAGNOSIS — F32A Depression, unspecified: Secondary | ICD-10-CM | POA: Diagnosis present

## 2021-06-02 DIAGNOSIS — F419 Anxiety disorder, unspecified: Secondary | ICD-10-CM | POA: Diagnosis present

## 2021-06-02 DIAGNOSIS — Z833 Family history of diabetes mellitus: Secondary | ICD-10-CM

## 2021-06-02 DIAGNOSIS — R1115 Cyclical vomiting syndrome unrelated to migraine: Principal | ICD-10-CM | POA: Diagnosis present

## 2021-06-02 DIAGNOSIS — Z20822 Contact with and (suspected) exposure to covid-19: Secondary | ICD-10-CM | POA: Diagnosis present

## 2021-06-02 DIAGNOSIS — F909 Attention-deficit hyperactivity disorder, unspecified type: Secondary | ICD-10-CM | POA: Diagnosis present

## 2021-06-02 DIAGNOSIS — Z79899 Other long term (current) drug therapy: Secondary | ICD-10-CM

## 2021-06-02 DIAGNOSIS — E162 Hypoglycemia, unspecified: Secondary | ICD-10-CM | POA: Diagnosis not present

## 2021-06-02 DIAGNOSIS — Z83438 Family history of other disorder of lipoprotein metabolism and other lipidemia: Secondary | ICD-10-CM

## 2021-06-02 DIAGNOSIS — Z8249 Family history of ischemic heart disease and other diseases of the circulatory system: Secondary | ICD-10-CM

## 2021-06-02 DIAGNOSIS — R112 Nausea with vomiting, unspecified: Secondary | ICD-10-CM | POA: Diagnosis not present

## 2021-06-02 DIAGNOSIS — F1290 Cannabis use, unspecified, uncomplicated: Secondary | ICD-10-CM | POA: Diagnosis present

## 2021-06-02 LAB — URINALYSIS, ROUTINE W REFLEX MICROSCOPIC
Bilirubin Urine: NEGATIVE
Glucose, UA: NEGATIVE mg/dL
Hgb urine dipstick: NEGATIVE
Ketones, ur: NEGATIVE mg/dL
Leukocytes,Ua: NEGATIVE
Nitrite: NEGATIVE
Protein, ur: NEGATIVE mg/dL
Specific Gravity, Urine: 1.002 — ABNORMAL LOW (ref 1.005–1.030)
pH: 6 (ref 5.0–8.0)

## 2021-06-02 LAB — RAPID URINE DRUG SCREEN, HOSP PERFORMED
Amphetamines: NOT DETECTED
Barbiturates: NOT DETECTED
Benzodiazepines: NOT DETECTED
Cocaine: NOT DETECTED
Opiates: NOT DETECTED
Tetrahydrocannabinol: POSITIVE — AB

## 2021-06-02 LAB — CBC
HCT: 41.7 % (ref 36.0–46.0)
Hemoglobin: 13.9 g/dL (ref 12.0–15.0)
MCH: 29.3 pg (ref 26.0–34.0)
MCHC: 33.3 g/dL (ref 30.0–36.0)
MCV: 88 fL (ref 80.0–100.0)
Platelets: 386 10*3/uL (ref 150–400)
RBC: 4.74 MIL/uL (ref 3.87–5.11)
RDW: 12.9 % (ref 11.5–15.5)
WBC: 7.9 10*3/uL (ref 4.0–10.5)
nRBC: 0 % (ref 0.0–0.2)

## 2021-06-02 LAB — COMPREHENSIVE METABOLIC PANEL
ALT: 16 U/L (ref 0–44)
AST: 21 U/L (ref 15–41)
Albumin: 4.6 g/dL (ref 3.5–5.0)
Alkaline Phosphatase: 60 U/L (ref 38–126)
Anion gap: 10 (ref 5–15)
BUN: <5 mg/dL — ABNORMAL LOW (ref 6–20)
CO2: 23 mmol/L (ref 22–32)
Calcium: 10.2 mg/dL (ref 8.9–10.3)
Chloride: 106 mmol/L (ref 98–111)
Creatinine, Ser: 0.83 mg/dL (ref 0.44–1.00)
GFR, Estimated: >60 mL/min (ref >60)
Glucose, Bld: 103 mg/dL — ABNORMAL HIGH (ref 70–99)
Potassium: 3.8 mmol/L (ref 3.5–5.1)
Sodium: 139 mmol/L (ref 135–145)
Total Bilirubin: 0.7 mg/dL (ref 0.3–1.2)
Total Protein: 8.4 g/dL — ABNORMAL HIGH (ref 6.5–8.1)

## 2021-06-02 LAB — RESP PANEL BY RT-PCR (FLU A&B, COVID) ARPGX2
Influenza A by PCR: NEGATIVE
Influenza B by PCR: NEGATIVE
SARS Coronavirus 2 by RT PCR: NEGATIVE

## 2021-06-02 LAB — I-STAT BETA HCG BLOOD, ED (MC, WL, AP ONLY): I-stat hCG, quantitative: <5 m[IU]/mL (ref <5)

## 2021-06-02 LAB — LIPASE, BLOOD: Lipase: 25 U/L (ref 11–51)

## 2021-06-02 LAB — CBG MONITORING, ED: Glucose-Capillary: 105 mg/dL — ABNORMAL HIGH (ref 70–99)

## 2021-06-02 MED ORDER — SODIUM CHLORIDE 0.9 % IV BOLUS
1000.0000 mL | Freq: Once | INTRAVENOUS | Status: AC
  Administered 2021-06-02: 1000 mL via INTRAVENOUS

## 2021-06-02 MED ORDER — LORAZEPAM 2 MG/ML IJ SOLN
0.5000 mg | Freq: Four times a day (QID) | INTRAMUSCULAR | Status: DC | PRN
  Administered 2021-06-02 – 2021-06-05 (×5): 0.5 mg via INTRAVENOUS
  Filled 2021-06-02 (×5): qty 1

## 2021-06-02 MED ORDER — ONDANSETRON HCL 4 MG/2ML IJ SOLN
4.0000 mg | Freq: Once | INTRAMUSCULAR | Status: AC
  Administered 2021-06-02: 4 mg via INTRAVENOUS
  Filled 2021-06-02: qty 2

## 2021-06-02 MED ORDER — IOHEXOL 300 MG/ML  SOLN
100.0000 mL | Freq: Once | INTRAMUSCULAR | Status: AC | PRN
  Administered 2021-06-02: 100 mL via INTRAVENOUS

## 2021-06-02 MED ORDER — ALUM & MAG HYDROXIDE-SIMETH 200-200-20 MG/5ML PO SUSP
30.0000 mL | Freq: Once | ORAL | Status: DC
Start: 2021-06-02 — End: 2021-06-02
  Filled 2021-06-02: qty 30

## 2021-06-02 MED ORDER — PANTOPRAZOLE SODIUM 40 MG IV SOLR
40.0000 mg | Freq: Every day | INTRAVENOUS | Status: DC
  Administered 2021-06-02 – 2021-06-04 (×3): 40 mg via INTRAVENOUS
  Filled 2021-06-02 (×3): qty 10

## 2021-06-02 MED ORDER — DEXTROSE-NACL 5-0.9 % IV SOLN: INTRAVENOUS | Status: DC

## 2021-06-02 MED ORDER — ENSURE ENLIVE PO LIQD
237.0000 mL | Freq: Two times a day (BID) | ORAL | Status: DC
  Administered 2021-06-03 – 2021-06-05 (×5): 237 mL via ORAL

## 2021-06-02 MED ORDER — PROCHLORPERAZINE EDISYLATE 10 MG/2ML IJ SOLN
10.0000 mg | Freq: Once | INTRAMUSCULAR | Status: DC
  Filled 2021-06-02: qty 2

## 2021-06-02 MED ORDER — METOCLOPRAMIDE HCL 5 MG/ML IJ SOLN
10.0000 mg | Freq: Four times a day (QID) | INTRAMUSCULAR | Status: DC | PRN
  Administered 2021-06-03 – 2021-06-04 (×5): 10 mg via INTRAVENOUS
  Filled 2021-06-02 (×5): qty 2

## 2021-06-02 MED ORDER — LIDOCAINE VISCOUS HCL 2 % MT SOLN
15.0000 mL | Freq: Once | OROMUCOSAL | Status: DC
  Filled 2021-06-02: qty 15

## 2021-06-02 MED ORDER — DROPERIDOL 2.5 MG/ML IJ SOLN
2.5000 mg | Freq: Once | INTRAMUSCULAR | Status: AC
  Administered 2021-06-02: 2.5 mg via INTRAVENOUS
  Filled 2021-06-02: qty 2

## 2021-06-02 NOTE — H&P (Addendum)
?History and Physical  ? ? ?Patient: Kristina Blair [id number] DOB: 07-Apr-1995 ?DOA: 06/02/2021 ?DOS: the patient was seen and examined on 06/02/2021 ?PCP: de [place], Raymond J, MD  ?Patient coming from: Home ? ?Chief Complaint:  ?Chief Complaint  ?Patient presents with  ? Abdominal Pain  ? Emesis  ? Hypoglycemia  ? ?HPI: Kristina Blair is a 26 y.o. female with medical history significant of hx of SVT, palpitations, ADHD, anxiety and depression who presents with intractable nausea and vomiting. ? ?Patient reports persistent nausea and vomiting for the past 2 weeks.  Has not been able to tolerate food or drinks.  Also has dry heaves on empty stomach with bilious vomiting.  Had 1 episode of blood-tinged vomitus.  Feels epigastric abdominal pain when she tries to eat.  Also feels food getting stuck at her throat but no sore throat or shortness of breath. She presented today because she was having a hypoglycemic episode down to the 30s when she used her grandmothers diabetic meter.  She was also seen for hypoglycemia yesterday at [facility].  She denies any recent travel, no sick contact.  She drinks bottled water. ?Reports that she previously was seen by endocrinology in January for hypoglycemia and night sweats.  Reports having a negative ACTH stimulation test , normal TSH and also wore a freestyle libre showing some nocturnal hypoglycemia but no further workup is planned that she knows of.  ?Denies tobacco, alcohol illicit drug use. ? ?She also had an episode of syncope with loss of consciousness about 4 days ago.  She has ongoing issues with syncope and has been following with cardiology with extensive work-up including echocardiogram, Zio patch with only findings of PVC/PAC of less than 1%. ? ?In the ED, she was afebrile normotensive on room air.  Has no leukocytosis or anemia.  Electrolytes are unremarkable.  BG of 103.  Creatinine is normal at 0.83. ?UA is negative. ?hCG is negative for pregnancy ? ?Lipase is negative.   Had negative right upper quadrant ultrasound.  CT abdomen and pelvis with contrast obtain also negative for any acute findings. ? ? ? ?Review of Systems: As mentioned in the history of present illness. All other systems reviewed and are negative. ?Past Medical History:  ?Diagnosis Date  ? Allergy   ? Anxiety   ? Phreesia 02/13/2020  ? ASCUS of cervix with negative high risk HPV   ? Asthma   ? Phreesia 02/13/2020  ? Depression   ? Phreesia 02/13/2020  ? GERD (gastroesophageal reflux disease)   ? Phreesia 02/13/2020  ? IUD (intrauterine device) in place 09/2019  ? liletta  ? Paroxysmal SVT (supraventricular tachycardia) (HCC)   ? ?Past Surgical History:  ?Procedure Laterality Date  ? WISDOM TOOTH EXTRACTION    ? ?Social History:  reports that she has never smoked. She has never used smokeless tobacco. She reports that she does not currently use alcohol. She reports that she does not use drugs. ? ?Allergies  ?Allergen Reactions  ? Kiwi Extract Other (See Comments)  ?  Tongue itching ?Tongue itching  ? ? ?Family History  ?Problem Relation Age of Onset  ? Diabetes Mother   ? Hypertension Mother   ? Breast cancer Mother 74  ?     recurrence age 11  ? Early death Mother   ? Hyperlipidemia Mother   ? Miscarriages / [place] Mother   ? Obesity Mother   ? Bipolar disorder Father   ? Cerebral palsy Brother   ? Epilepsy Brother   ?  Diabetes Maternal Grandmother   ? Heart disease Maternal Grandmother   ? Hyperlipidemia Maternal Grandmother   ? Hypertension Maternal Grandmother   ? Cancer Maternal Grandfather   ?     leukemia  ? Cancer Maternal Aunt 24  ?     uterine cancer, passed age 94  ? Ovarian cancer Maternal Great-grandmother   ?     passed aroung age 83  ? ? ?Prior to Admission medications   ?Medication Sig Start Date End Date Taking? Authorizing Provider  ?potassium chloride SA (KLOR-CON M) 20 MEQ tablet Take 1 tablet (20 mEq total) by mouth daily. 05/30/21   Petrucelli, Glynda Jaeger, PA-C  ?promethazine (PHENERGAN) 25  MG tablet Take 1 tablet (25 mg total) by mouth every 6 (six) hours as needed for nausea or vomiting. 05/10/21   Carlisle Cater, PA-C  ?propranolol (INDERAL) 10 MG tablet Take 0.5 tablets (5 mg total) by mouth 3 (three) times daily as needed. 06/01/21   Loel Dubonnet, NP  ? ? ?Physical Exam: ?Vitals:  ? 06/02/21 1508 06/02/21 1839 06/02/21 1900  ?BP: 116/80 134/85 129/85  ?Pulse: 99 76 77  ?Resp: 18 16 15   ?Temp: 98 ?F (36.7 ?C) 98.2 ?F (36.8 ?C) 98.2 ?F (36.8 ?C)  ?TempSrc: Oral Oral Oral  ?SpO2: 100% 100% 100%  ? ?Constitutional: NAD, calm, comfortable, young female initially laying in bed asleep ?Eyes: lids and conjunctivae normal ?ENMT: Mucous membranes are moist.  Cobblestoning of the posterior tongue.  Visible epiglottitis at posterior tongue but does not appear edematous.  Posterior pharynx clear of any exudate or lesions.Normal dentition.  ?Neck: normal, supple,  ?Respiratory: clear to auscultation bilaterally, no wheezing, no crackles. Normal respiratory effort.  ?Cardiovascular: Regular rate and rhythm, no murmurs / rubs / gallops. No extremity edema.  ?Abdomen: Soft, nonnondistended, no tenderness, no masses palpated. Bowel sounds positive.  ?Musculoskeletal: no clubbing / cyanosis. No joint deformity upper and lower extremities. Good ROM, no contractures. Normal muscle tone.  ?Skin: no rashes, lesions, ulcers.  ?Neurologic: CN 2-12 grossly intact. Sensation intact, DTR normal. Strength 5/5 in all 4.  ?Psychiatric: Normal judgment and insight. Alert and oriented x 3. Normal mood. ?Data Reviewed: ? ?See HPI  ? ?Assessment and Plan: ?* Intractable nausea and vomiting ?Presented with 2 weeks of intractable nausea and vomiting and inability to tolerate p.o.  Unclear etiology.  Thus far, CT abdomen and pelvis and right upper quadrants ultrasounds negative.  No leukocytosis to suggest infective cause. HCG is negative ?-Check UDS ?-She reports that Zofran, Phenergan does not work for her. ?- Droperidol given in  the ED offered only minimal relief. ?-PRN metoclopramide 10mg  qhr, Ativan 0.5mg  q6hr for refractory N/V ? ?Hypoglycemia ?Glucose is normal in ED at 103 ?-keep on continuous IV D5-NS overnight and follow in the morning  ?-Last hemoglobin A1c of 5.2 on 02/2021 ? ? ? ? ? Advance Care Planning:   Code Status: Full Code  ? ?Consults: none ? ?Family Communication: Grandmother came in at the end of evaluation and is aware of her admission ? ?Severity of Illness: ?The appropriate patient status for this patient is OBSERVATION. Observation status is judged to be reasonable and necessary in order to provide the required intensity of service to ensure the patient's safety. The patient's presenting symptoms, physical exam findings, and initial radiographic and laboratory data in the context of their medical condition is felt to place them at decreased risk for further clinical deterioration. Furthermore, it is anticipated that the patient will be  medically stable for discharge from the hospital within 2 midnights of admission.  ? ?Author: Orene Desanctis, DO ?06/02/2021 8:08 PM ? ?For on call review [email or web address].  ?

## 2021-06-02 NOTE — ED Triage Notes (Addendum)
Pt reports hypoglycemia, nausea, emesis x 1week. Pt reports CBG being 30-90. Pt reports eating glucose tabs approx 20 mins ago. CBG in triage 109.  ?

## 2021-06-02 NOTE — Assessment & Plan Note (Addendum)
She presented with 2-week history of intractable nausea and vomiting and inability to tolerate p.o. intake. ?Unclear etiology thus far CT abdomen and pelvis and right upper quadrant ultrasound negative.  No leukocytosis to suggest infective cause.  hCG is negative. ?Urine drug screen positive for marijuana. ?She reports has tried Zofran and Phenergan did not work. ?Droperidol given in the ED offered,  provided only minimal relief. ?PRN metoclopramide 10mg  qhr, Ativan 0.5mg  q6hr for refractory N/V ?Declined Maalox ordered by ED provider.  Trial of IV PPI ?She reports slight improvement was able to tolerate liquids. ?GI was consulted recommending gastric emptying studies for gastroparesis. ?Patient tolerated clear liquid diet advance to full liquids.  Patient feels better want to be discharged. ?

## 2021-06-02 NOTE — Assessment & Plan Note (Addendum)
Glucose is normal during hospitalization. ?Continue IV hydration. ?Last hemoglobin A1c 5.2. ?

## 2021-06-02 NOTE — ED Provider Notes (Signed)
?[facility]-EMERGENCY DEPT ?Provider Note ? ? ?CSN: [id number] ?Arrival date & time: 06/02/21  1501 ? ?  ? ?History ? ?Chief Complaint  ?Patient presents with  ? Abdominal Pain  ? Emesis  ? Hypoglycemia  ? ? ?Kristina Blair is a 26 y.o. female with past medical history of several recent emergency department visits for ongoing nausea, vomiting, heart palpitations, syncope who presents with ongoing abdominal pain, nausea vomiting, recurrent episodes of hypoglycemia.  Patient reports some right upper quadrant/epigastric abdominal pain especially with eating.  She denies significant pain at rest.  She reports that she does have significant nausea, dry heaving at rest.  She is tried Zofran, Reglan, Compazine with minimal relief.  She has not had any kind of ultrasound or CT abdomen and her work-ups thus far.  She had an echo done by cardiology to evaluate for palpitations, they did not see any significant abnormality.  Patient denies any palpitations today, she denies chest pain, shortness of breath.  She denies dysuria, hematuria, vaginal discharge, dyspareunia.  She reports that her cycle is regular secondary to IUD.  She denies any fever, chills.  She denies any alcohol, drug use.  Patient denies burning sensation in her throat but she does feel like there may be a sensation like something stuck at the bottom of her throat although she is not having any difficulty swallowing, does not remember anything that physically could have gotten stuck in that space. ?  ? ?Abdominal Pain ?Associated symptoms: vomiting   ?Emesis ?Associated symptoms: abdominal pain   ?Hypoglycemia ?Associated symptoms: vomiting   ? ?  ? ?Home Medications ?Prior to Admission medications   ?Medication Sig Start Date End Date Taking? Authorizing Provider  ?potassium chloride SA (KLOR-CON M) 20 MEQ tablet Take 1 tablet (20 mEq total) by mouth daily. 05/30/21   Petrucelli, Pleas Koch, PA-C  ?promethazine (PHENERGAN) 25 MG tablet Take 1  tablet (25 mg total) by mouth every 6 (six) hours as needed for nausea or vomiting. 05/10/21   Renne Crigler, PA-C  ?propranolol (INDERAL) 10 MG tablet Take 0.5 tablets (5 mg total) by mouth 3 (three) times daily as needed. 06/01/21   Alver Sorrow, NP  ?   ? ?Allergies    ?Kiwi extract   ? ?Review of Systems   ?Review of Systems  ?Gastrointestinal:  Positive for abdominal pain and vomiting.  ?All other systems reviewed and are negative. ? ?Physical Exam ?Updated Vital Signs ?BP 134/85 (BP Location: Left Arm)   Pulse 76   Temp 98.2 ?F (36.8 ?C) (Oral)   Resp 16   LMP 05/13/2021 (Exact Date)   SpO2 100%  ?Physical Exam ?Vitals and nursing note reviewed.  ?Constitutional:   ?   General: She is not in acute distress. ?   Appearance: Normal appearance.  ?HENT:  ?   Head: Normocephalic and atraumatic.  ?   Mouth/Throat:  ?   Mouth: Mucous membranes are dry.  ?Eyes:  ?   General:     ?   Right eye: No discharge.     ?   Left eye: No discharge.  ?Cardiovascular:  ?   Rate and Rhythm: Normal rate and regular rhythm.  ?   Heart sounds: No murmur heard. ?  No friction rub. No gallop.  ?Pulmonary:  ?   Effort: Pulmonary effort is normal.  ?   Breath sounds: Normal breath sounds.  ?Abdominal:  ?   General: Bowel sounds are normal.  ?  Palpations: Abdomen is soft.  ?   Comments: Some tenderness palpation in right upper quadrant/epigastric region.  No rebound, rigidity, guarding.   Normal bowel sounds throughout all 4 quadrants.  ?Skin: ?   General: Skin is warm and dry.  ?   Capillary Refill: Capillary refill takes less than 2 seconds.  ?Neurological:  ?   Mental Status: She is alert and oriented to person, place, and time.  ?Psychiatric:     ?   Mood and Affect: Mood normal.     ?   Behavior: Behavior normal.  ?   Comments: Somewhat anxious but overall well, normal thought content, alert and oriented x3  ? ? ?ED Results / Procedures / Treatments   ?Labs ?(all labs ordered are listed, but only abnormal results are  displayed) ?Labs Reviewed  ?COMPREHENSIVE METABOLIC PANEL - Abnormal; Notable for the following components:  ?    Result Value  ? Glucose, Bld 103 (*)   ? BUN <5 (*)   ? Total Protein 8.4 (*)   ? All other components within normal limits  ?URINALYSIS, ROUTINE W REFLEX MICROSCOPIC - Abnormal; Notable for the following components:  ? Color, Urine STRAW (*)   ? Specific Gravity, Urine 1.002 (*)   ? All other components within normal limits  ?RAPID URINE DRUG SCREEN, HOSP PERFORMED - Abnormal; Notable for the following components:  ? Tetrahydrocannabinol POSITIVE (*)   ? All other components within normal limits  ?CBG MONITORING, ED - Abnormal; Notable for the following components:  ? Glucose-Capillary 105 (*)   ? All other components within normal limits  ?RESP PANEL BY RT-PCR (FLU A&B, COVID) ARPGX2  ?LIPASE, BLOOD  ?CBC  ?I-STAT BETA HCG BLOOD, ED ([facility], [facility], [facility] ONLY)  ? ? ?EKG ?None ? ?Radiology ?CT ABDOMEN PELVIS W CONTRAST ? ?Result Date: 06/02/2021 ?CLINICAL DATA:  Abdominal pain, acute, nonlocalized EXAM: CT ABDOMEN AND PELVIS WITH CONTRAST TECHNIQUE: Multidetector CT imaging of the abdomen and pelvis was performed using the standard protocol following bolus administration of intravenous contrast. RADIATION DOSE REDUCTION: This exam was performed according to the departmental dose-optimization program which includes automated exposure control, adjustment of the mA and/or kV according to patient size and/or use of iterative reconstruction technique. CONTRAST:  111mL OMNIPAQUE IOHEXOL 300 MG/ML  SOLN COMPARISON:  CT 12/05/2020, right upper quadrant ultrasound 06/02/2021 FINDINGS: Lower chest: Included lung bases are clear.  Heart size is normal. Hepatobiliary: No focal liver abnormality is seen. No gallstones, gallbladder wall thickening, or biliary dilatation. Pancreas: Unremarkable. No pancreatic ductal dilatation or surrounding inflammatory changes. Spleen: Normal in size without focal abnormality. Adrenals/Urinary  Tract: Unremarkable adrenal glands. Kidneys enhance symmetrically without focal lesion, stone, or hydronephrosis. Ureters are nondilated. Urinary bladder appears unremarkable for the degree of distension. Stomach/Bowel: Stomach is within normal limits. Appendix appears normal (series 4, image 63). No evidence of bowel wall thickening, distention, or inflammatory changes. Vascular/Lymphatic: No significant vascular findings are present. No enlarged abdominal or pelvic lymph nodes. Reproductive: Retroverted uterus containing IUD. Adnexal regions within normal limits for age. Other: No free fluid. No abdominopelvic fluid collection. No pneumoperitoneum. No abdominal wall hernia. Musculoskeletal: No acute or significant osseous findings. IMPRESSION: No acute abdominopelvic findings. Electronically Signed   By: Davina Poke D.O.   On: 06/02/2021 18:06  ? ?US Abdomen Limited RUQ (LIVER/GB) ? ?Result Date: 06/02/2021 ?CLINICAL DATA:  Right upper quadrant abdominal pain. EXAM: ULTRASOUND ABDOMEN LIMITED RIGHT UPPER QUADRANT COMPARISON:  CT abdomen 12/05/2020 FINDINGS: Gallbladder: No gallstones or wall  thickening visualized. No sonographic Murphy sign noted by sonographer. Common bile duct: Diameter: 0.3 cm Liver: No focal lesion identified. Within normal limits in parenchymal echogenicity. Portal vein is patent on color Doppler imaging with normal direction of blood flow towards the liver. Other: None. IMPRESSION: Normal sonographic evaluation of the right upper abdomen. Electronically Signed   By: Ileana Roup M.D.   On: 06/02/2021 16:39   ? ?Procedures ?Procedures  ? ? ?Medications Ordered in ED ?Medications  ?alum & mag hydroxide-simeth (MAALOX/MYLANTA) 200-200-20 MG/5ML suspension 30 mL (30 mLs Oral Not Given 06/02/21 1641)  ?  And  ?lidocaine (XYLOCAINE) 2 % viscous mouth solution 15 mL (15 mLs Oral Not Given 06/02/21 1641)  ?ondansetron (ZOFRAN) injection 4 mg (4 mg Intravenous Given 06/02/21 1539)  ?sodium chloride 0.9  % bolus 1,000 mL (0 mLs Intravenous Stopped 06/02/21 1701)  ?droperidol (INAPSINE) 2.5 MG/ML injection 2.5 mg (2.5 mg Intravenous Given 06/02/21 1730)  ?iohexol (OMNIPAQUE) 300 MG/ML solution 100 mL (100 mLs Intraven

## 2021-06-02 NOTE — ED Provider Notes (Signed)
?  Face-to-face evaluation ? ? ?History: She presents for evaluation of persistent vomiting for 2 weeks, despite multiple prior evaluations during this timeframe for an array of problems.  She has previously had comprehensive evaluation by endocrinology.  She has been seen numerous times by cardiology, most recently, several days ago when they did a comprehensive evaluation of her recent condition.  2 days ago, her PCP referred her to neurology for evaluation of possible neurologic causes of recurrent syncope.  Patient does not feel that her PCP is not meeting her needs.  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 ? ?Physical exam: Patient is alert, comfortable.  She is conversant and only has episodes of retching while I am in the room.  She is not actively vomiting.  There is no dysarthria or aphasia.  There is no respiratory distress.  She does not appear uncomfortable. ? ?MDM: Evaluation for  ?Chief Complaint  ?Patient presents with  ? Abdominal Pain  ? Emesis  ? Hypoglycemia  ?  ? ?Patient with widespread symptoms, most recently bothered by nausea and vomiting with a stated inability to tolerate any food or fluids for 2 weeks.  Screening evaluation in the ED does not reveal sources or findings consistent with this historical complaint.  She reports that she has low blood sugar at night, and that was previously found on testing that her endocrinologist did.  She takes oral glucose tablets when she finds her blood sugar low.  She is using a relatives blood sugar machine for this assessment.  Patient is not evidencing any hemodynamic abnormalities.  There are no laboratory evaluations requiring intervention.  We will consult hospitalist for monitoring and further assessment with possible gastroenterology consultation. ? ?Medical screening examination/treatment/procedure(s) were conducted as a shared visit with non-physician practitioner(s) and myself.  I personally evaluated the patient during  the encounter ? ?  ?Mancel Bale, MD ?06/03/21 1641 ? ?

## 2021-06-02 NOTE — ED Notes (Signed)
Patient transported to CT 

## 2021-06-03 DIAGNOSIS — R112 Nausea with vomiting, unspecified: Secondary | ICD-10-CM | POA: Diagnosis not present

## 2021-06-03 LAB — HIV ANTIBODY (ROUTINE TESTING W REFLEX): HIV Screen 4th Generation wRfx: NONREACTIVE

## 2021-06-03 LAB — BASIC METABOLIC PANEL
Anion gap: 7 (ref 5–15)
BUN: <5 mg/dL — ABNORMAL LOW (ref 6–20)
CO2: 24 mmol/L (ref 22–32)
Calcium: 9.5 mg/dL (ref 8.9–10.3)
Chloride: 107 mmol/L (ref 98–111)
Creatinine, Ser: 0.71 mg/dL (ref 0.44–1.00)
GFR, Estimated: >60 mL/min (ref >60)
Glucose, Bld: 93 mg/dL (ref 70–99)
Potassium: 3.6 mmol/L (ref 3.5–5.1)
Sodium: 138 mmol/L (ref 135–145)

## 2021-06-03 MED ORDER — SODIUM CHLORIDE 0.9 % IV SOLN: INTRAVENOUS | Status: DC

## 2021-06-03 NOTE — Plan of Care (Signed)
Pt aox4, pleasant and cooperative with staff.  ?IVF/Antiemetics per orders.  ? ?Problem: Education: ?Goal: Knowledge of General Education information will improve ?Description: Including pain rating scale, medication(s)/side effects and non-pharmacologic comfort measures ?Outcome: Progressing ?  ?Problem: Health Behavior/Discharge Planning: ?Goal: Ability to manage health-related needs will improve ?Outcome: Progressing ?  ?Problem: Clinical Measurements: ?Goal: Ability to maintain clinical measurements within normal limits will improve ?Outcome: Progressing ?Goal: Will remain free from infection ?Outcome: Progressing ?Goal: Diagnostic test results will improve ?Outcome: Progressing ?Goal: Respiratory complications will improve ?Outcome: Progressing ?Goal: Cardiovascular complication will be avoided ?Outcome: Progressing ?  ?Problem: Activity: ?Goal: Risk for activity intolerance will decrease ?Outcome: Progressing ?  ?Problem: Nutrition: ?Goal: Adequate nutrition will be maintained ?Outcome: Progressing ?  ?Problem: Coping: ?Goal: Level of anxiety will decrease ?Outcome: Progressing ?  ?Problem: Elimination: ?Goal: Will not experience complications related to bowel motility ?Outcome: Progressing ?Goal: Will not experience complications related to urinary retention ?Outcome: Progressing ?  ?Problem: Pain Managment: ?Goal: General experience of comfort will improve ?Outcome: Progressing ?  ?Problem: Safety: ?Goal: Ability to remain free from injury will improve ?Outcome: Progressing ?  ?Problem: Skin Integrity: ?Goal: Risk for impaired skin integrity will decrease ?Outcome: Progressing ?  ?

## 2021-06-03 NOTE — Progress Notes (Signed)
?Progress Note ? ? ?Patient: Kristina Blair OEU:[id number] DOB: 12-27-1995 DOA: 06/02/2021     0 ? ?DOS: the patient was seen and examined on 06/03/2021 ?  ?Brief hospital course: ?This 26 years old female with PMH significant for SVT, ADHD, anxiety, depression presented in the ED with intractable nausea and vomiting for last 2 weeks.  Patient has not been able to tolerate any food or drinks.  Patient also reports dry heaving on empty stomach with bilious vomiting.  Patient also found to have hypoglycemic episodes where her fingerstick was was found to be 30.  She was seen for hypoglycemia at [facility].  Patient reports has previously seen endocrinology in January for hypoglycemia and night sweats,  reports having negative ACTH stimulation test , normal TSH and also worefreestyle libre showing some nocturnal hypoglycemia but further work-up was not completed.  Patient also reports syncopal episode secondary to hypoglycemia 4 days ago.  She also been following up with cardiology with extensive work-up including echo Zio patch with only finding of PVCs PACs of less than 1%. ?Patient is admitted for intractable nausea and vomiting. ? ?Assessment and Plan: ?* Intractable nausea and vomiting ?She presented with 2-week history of intractable nausea and vomiting and inability to tolerate p.o. intake. ?Unclear etiology thus far CT abdomen and pelvis and right upper quadrant ultrasound negative.  No leukocytosis to suggest infective cause.  hCG is negative. ?Urine drug screen positive for marijuana. ?She reports has tried Zofran and Phenergan did not work. ?Droperidol given in the ED offered only minimal relief. ?PRN metoclopramide 10mg  qhr, Ativan 0.5mg  q6hr for refractory N/V ?Declined Maalox ordered by ED provider.  Trial of IV PPI ?She reports slight improvement was able to tolerate liquids. ? ?Hypoglycemia ?Glucose is normal in the ED 103. ?Continue IV fluid D5 half-normal saline. ?Last hemoglobin A1c 5.2. ? ? ? ?Subjective:  Patient was seen and examined at bedside.  Overnight events noted.   ?She reports she is able to tolerate liquid diet today. ? ?Physical Exam: ?Vitals:  ? 06/03/21 0034 06/03/21 0453 06/03/21 0906 06/03/21 1431  ?BP: (!) 121/52 118/70 116/68 122/84  ?Pulse: 66 89 79 84  ?Resp: 18 18 18 14   ?Temp: 97.8 ?F (36.6 ?C) 98.4 ?F (36.9 ?C) 98.5 ?F (36.9 ?C) 98 ?F (36.7 ?C)  ?TempSrc: Oral Oral  Oral  ?SpO2: 100% 100% 100% 100%  ?Weight: 52.5 kg     ?Height: 5' 0.98" (1.549 m)     ? ?General exam: Appears comfortable, not in any acute distress. ?Respiratory system: CTA bilaterally, no wheezing, no crackles, normal respiratory effort. ?Cardiovascular system: S1-S2 heard, regular rate and rhythm, no murmur. ?Gastrointestinal system: Abdomen is soft, nontender, nondistended, BS+ ?Central nervous system: Alert, oriented x3, no focal neurological deficits. ?Extremities: No edema, no cyanosis, no clubbing. ?Psychiatry: Mood, insight, judgment normal. ? ?Data Reviewed: ?I have Reviewed nursing notes, Vitals, and Lab results since pt's last encounter. Pertinent lab results CBC, CMP, mag, Phos ?I have ordered test including CBC, BMP ?I have reviewed the last note from hospitalist,  ?I have discussed pt's care plan and test results with patient.  ? ?Family Communication: No family at bedside ? ?Disposition: ?Status is: Observation ?The patient remains OBS appropriate and will d/c before 2 midnights. ? ?Admitted for intractable nausea and vomiting and hypoglycemia.  Nausea and vomiting is improving.  Hypoglycemia better. ? ?Anticipated discharge home in 1 to 2 days. ? ? ? Planned Discharge Destination: Home ? ? ? ? ?Time spent: 50 minutes ? ?  Author: ?Cipriano Bunker, MD ?06/03/2021 2:46 PM ? ?For on call review [email or web address].  ?

## 2021-06-03 NOTE — Hospital Course (Addendum)
This 26 years old female with PMH significant for SVT, ADHD, anxiety, depression presented in the ED with intractable nausea and vomiting for last 2 weeks. Patient has not been able to tolerate any food or drinks. Patient also reports dry heaving on empty stomach with bilious vomiting.  Patient also found to have hypoglycemic episodes where her fingerstick was was found to be 30.  She was seen for hypoglycemia at [facility].  Patient reports has previously seen endocrinology in January for hypoglycemia and night sweats,  reports having negative ACTH stimulation test , normal TSH and also wore freestyle libre showing some nocturnal hypoglycemia but further work-up was not completed.  Patient also reports syncopal episode secondary to hypoglycemia 4 days ago.  She also been following up with cardiology with extensive work-up including echo,  Zio patch with only finding of PVCs PACs of less than 1%. ?Patient was admitted for intractable nausea and vomiting. GI is consulted, recommended gastric emptying studies.  Patient was continued on Zofran, started on Reglan, continued on IV hydration.  She was started on clear liquid diet tolerated well advance to full liquids.  GI recommended gastric emptying studies but it can be done outpatient patient feels better want to be discharged.  Patient is being discharged home/ ?

## 2021-06-04 DIAGNOSIS — F32A Depression, unspecified: Secondary | ICD-10-CM | POA: Diagnosis present

## 2021-06-04 DIAGNOSIS — F909 Attention-deficit hyperactivity disorder, unspecified type: Secondary | ICD-10-CM | POA: Diagnosis present

## 2021-06-04 DIAGNOSIS — R1013 Epigastric pain: Secondary | ICD-10-CM | POA: Diagnosis not present

## 2021-06-04 DIAGNOSIS — R112 Nausea with vomiting, unspecified: Secondary | ICD-10-CM | POA: Diagnosis present

## 2021-06-04 DIAGNOSIS — R1115 Cyclical vomiting syndrome unrelated to migraine: Secondary | ICD-10-CM | POA: Diagnosis present

## 2021-06-04 DIAGNOSIS — F1290 Cannabis use, unspecified, uncomplicated: Secondary | ICD-10-CM | POA: Diagnosis present

## 2021-06-04 DIAGNOSIS — Z20822 Contact with and (suspected) exposure to covid-19: Secondary | ICD-10-CM | POA: Diagnosis present

## 2021-06-04 DIAGNOSIS — Z83438 Family history of other disorder of lipoprotein metabolism and other lipidemia: Secondary | ICD-10-CM | POA: Diagnosis not present

## 2021-06-04 DIAGNOSIS — N9489 Other specified conditions associated with female genital organs and menstrual cycle: Secondary | ICD-10-CM | POA: Diagnosis not present

## 2021-06-04 DIAGNOSIS — Z833 Family history of diabetes mellitus: Secondary | ICD-10-CM | POA: Diagnosis not present

## 2021-06-04 DIAGNOSIS — E162 Hypoglycemia, unspecified: Secondary | ICD-10-CM | POA: Diagnosis present

## 2021-06-04 DIAGNOSIS — Z79899 Other long term (current) drug therapy: Secondary | ICD-10-CM | POA: Diagnosis not present

## 2021-06-04 DIAGNOSIS — F419 Anxiety disorder, unspecified: Secondary | ICD-10-CM | POA: Diagnosis present

## 2021-06-04 DIAGNOSIS — Z8249 Family history of ischemic heart disease and other diseases of the circulatory system: Secondary | ICD-10-CM | POA: Diagnosis not present

## 2021-06-04 LAB — CBC
HCT: 36.5 % (ref 36.0–46.0)
Hemoglobin: 11.7 g/dL — ABNORMAL LOW (ref 12.0–15.0)
MCH: 28.9 pg (ref 26.0–34.0)
MCHC: 32.1 g/dL (ref 30.0–36.0)
MCV: 90.1 fL (ref 80.0–100.0)
Platelets: 326 10*3/uL (ref 150–400)
RBC: 4.05 MIL/uL (ref 3.87–5.11)
RDW: 12.8 % (ref 11.5–15.5)
WBC: 8.5 10*3/uL (ref 4.0–10.5)
nRBC: 0 % (ref 0.0–0.2)

## 2021-06-04 LAB — BASIC METABOLIC PANEL
Anion gap: 4 — ABNORMAL LOW (ref 5–15)
BUN: <5 mg/dL — ABNORMAL LOW (ref 6–20)
CO2: 25 mmol/L (ref 22–32)
Calcium: 9.3 mg/dL (ref 8.9–10.3)
Chloride: 109 mmol/L (ref 98–111)
Creatinine, Ser: 0.73 mg/dL (ref 0.44–1.00)
GFR, Estimated: >60 mL/min (ref >60)
Glucose, Bld: 78 mg/dL (ref 70–99)
Potassium: 3.9 mmol/L (ref 3.5–5.1)
Sodium: 138 mmol/L (ref 135–145)

## 2021-06-04 LAB — PHOSPHORUS: Phosphorus: 3.4 mg/dL (ref 2.5–4.6)

## 2021-06-04 LAB — MAGNESIUM: Magnesium: 2 mg/dL (ref 1.7–2.4)

## 2021-06-04 NOTE — Progress Notes (Signed)
?Progress Note ? ? ?Patient: Kristina Blair TMA:[id number] DOB: 31-Mar-1995 DOA: 06/02/2021     0 ? ?DOS: the patient was seen and examined on 06/04/2021 ?  ?Brief hospital course: ?This 26 years old female with PMH significant for SVT, ADHD, anxiety, depression presented in the ED with intractable nausea and vomiting for last 2 weeks.  Patient has not been able to tolerate any food or drinks.  Patient also reports dry heaving on empty stomach with bilious vomiting.  Patient also found to have hypoglycemic episodes where her fingerstick was was found to be 30.  She was seen for hypoglycemia at [facility].  Patient reports has previously seen endocrinology in January for hypoglycemia and night sweats,  reports having negative ACTH stimulation test , normal TSH and also worefreestyle libre showing some nocturnal hypoglycemia but further work-up was not completed.  Patient also reports syncopal episode secondary to hypoglycemia 4 days ago.  She also been following up with cardiology with extensive work-up including echo Zio patch with only finding of PVCs PACs of less than 1%. ?Patient is admitted for intractable nausea and vomiting.  GI is consulted, recommended gastric emptying studies. ? ?Assessment and Plan: ?* Intractable nausea and vomiting ?She presented with 2-week history of intractable nausea and vomiting and inability to tolerate p.o. intake. ?Unclear etiology thus far CT abdomen and pelvis and right upper quadrant ultrasound negative.  No leukocytosis to suggest infective cause.  hCG is negative. ?Urine drug screen positive for marijuana. ?She reports has tried Zofran and Phenergan did not work. ?Droperidol given in the ED offered provided only minimal relief. ?PRN metoclopramide 10mg  qhr, Ativan 0.5mg  q6hr for refractory N/V ?Declined Maalox ordered by ED provider.  Trial of IV PPI ?She reports slight improvement was able to tolerate liquids. ?GI was consulted recommending gastric emptying studies for  gastroparesis. ? ?Hypoglycemia ?Glucose is normal during hospitalization. ?Continue IV hydration. ?Last hemoglobin A1c 5.2. ? ? ? ?Subjective: Patient was seen and examined at bedside.  Overnight events noted.   ?Patient reports feeling better, she still has significant nausea and throwing up. ? ?Physical Exam: ?Vitals:  ? 06/03/21 0906 06/03/21 1431 06/03/21 2014 06/04/21 0508  ?BP: 116/68 122/84 126/80 129/86  ?Pulse: 79 84 94 75  ?Resp: 18 14 18 18   ?Temp: 98.5 ?F (36.9 ?C) 98 ?F (36.7 ?C) 99.4 ?F (37.4 ?C) 98.9 ?F (37.2 ?C)  ?TempSrc:  Oral Oral Oral  ?SpO2: 100% 100% 100% 100%  ?Weight:      ?Height:      ? ?General exam: Appears comfortable, not in any acute distress. ?Respiratory system: CTA bilaterally, no wheezing, no crackles, normal respiratory effort. ?Cardiovascular system: S1-S2 heard, regular rate and rhythm, no murmur. ?Gastrointestinal system: Abdomen is soft, non tender, non distended, BS+ ?Central nervous system: Alert, oriented x3, no focal neurological deficits. ?Extremities: No edema, no cyanosis, no clubbing. ?Psychiatry: Mood, insight, judgment normal. ? ?Data Reviewed: ?I have Reviewed nursing notes, Vitals, and Lab results since pt's last encounter. Pertinent lab results CBC, BMP ?I have ordered test including CBC, BMP ?I have reviewed the last note from gastroenterology,  ?I have discussed pt's care plan and test results with patient.  ? ?Family Communication: No family at bedside ? ?Disposition: ?Status is: Observation ?The patient remains OBS appropriate and will d/c before 2 midnights. ?  ? ?Admitted for intractable nausea and vomiting and hypoglycemia.  Nausea and vomiting is improving.  Hypoglycemia better. ? ?Anticipated discharge home in 1 to 2 days. ? ? ?  Planned Discharge Destination: Home ? ? ? ? ?Time spent: 35 minutes ? ?Author: ?Cipriano Bunker, MD ?06/04/2021 12:38 PM ? ?For on call review [email or web address].  ?

## 2021-06-04 NOTE — Addendum Note (Signed)
Addended by: Marlene Lard on: 06/04/2021 08:40 AM ? ? Modules accepted: Orders ? ?

## 2021-06-04 NOTE — Consult Note (Signed)
Referring Provider: [facility] ?Primary Care Physician:  [place], Blondell Reveal, MD ?Primary Gastroenterologist:  Althia Forts ? ?Reason for Consultation:  Intractable nausea and vomiting ? ?HPI: Kristina Blair is a 26 y.o. female  PMH significant for SVT, ADHD, anxiety, depression  presents for evaluation of intractable nausea and vomiting for the last 2 weeks. ? ?Patient has been having hypoglycemic episodes where her fingerstick was found to be 30.  Seen by endocrinology in January for hypoglycemia and night sweats.  Reports negative ACTH stimulation test, normal TSH.  Also reports syncopal episode secondary to hypoglycemia 4 days ago has had extensive cardiac work-up including echo and Zio patch with only finding of PVCs, PACs of less than 1%. ? ?CT abdomen pelvis and right upper quadrant ultrasound was negative.  UDA positive for marijuana.  Has tried Zofran and Phenergan without relief.  Droperidol given in ED provided minimal relief.  Currently on metoclopramide 10 Mg as needed and Ativan 0.5 Mg every 6 hours. ? ?Patient states anytime she attempts solids or liquids that she will immediately vomited up.  States occasionally liquids will get caught up above her suprasternal notch.  No dysphagia to solids.  Currently she is keeping down clear liquids.  Though if she has anything with substances such as Jell-O it will come back up.  No change in bowel movements.  Reports epigastric pain with eating as well.  States prior to 2 weeks ago she did not have any of the symptoms.  Has not had this happen before. ? ?4 years ago she was having recurrent hiccups and reflux, was seen by GI in [place] who did an EGD.  Patient reports this EGD was normal.  She was put on omeprazole without any improvement.  No longer having hiccups at this time. ? ?Patient states she has lost 4 pounds over the last 2 weeks due to not eating.  Family history of colon cancer in grandfather.  Family history of gastroparesis and brother. Denies NSAID use,  tobacco use, or alcohol use. ? ? ?Past Medical History:  ?Diagnosis Date  ? Allergy   ? Anxiety   ? Phreesia 02/13/2020  ? ASCUS of cervix with negative high risk HPV   ? Asthma   ? [employer]reesia 02/13/2020  ? Depression   ? [employer]rees[employer] 02/13/2020  ? GERD (gastroesophageal reflux disease)   ? [employer]reesia 02/13/2020  ? IUD (intrauterine device) in place 09/2019  ? liletta  ? Paroxysmal SVT (supraventricular tachycardia) (HCC)   ? ? ?Past Surgical History:  ?Procedure Laterality Date  ? WISDOM TOOTH EXTRACTION    ? ? ?Prior to Admission medications   ?Medication Sig Start Date End Date Taking? Authorizing Provider  ?potassium chloride SA (KLOR-CON M) 20 MEQ tablet Take 1 tablet (20 mEq total) by mouth daily. 05/30/21  Yes Petrucelli, Samantha R, PA-C  ?promethazine (PHENERGAN) 25 MG tablet Take 1 tablet (25 mg total) by mouth every 6 (six) hours as needed for nausea or vomiting. 05/10/21  Yes Carlisle Cater, PA-C  ?propranolol (INDERAL) 10 MG tablet Take 0.5 tablets (5 mg total) by mouth 3 (three) times daily as needed. ?Patient taking differently: Take 5 mg by mouth 3 (three) times daily as needed (elevated heart rate). 06/01/21  Yes Loel Dubonnet, NP  ? ? ?Scheduled Meds: ? feeding supplement  237 mL Oral BID BM  ? pantoprazole (PROTONIX) IV  40 mg Intravenous QHS  ? ?Continuous Infusions: ? sodium chloride 100 mL/hr at 06/04/21 0954  ? ?PRN Meds:.LORazepam, metoCLOPramide (REGLAN) injection ? ?  Allergies as of 06/02/2021 - Review Complete 06/02/2021  ?Allergen Reaction Noted  ? Kiwi extract Other (See Comments) 12/05/2020  ? ? ?Family History  ?Problem Relation Age of Onset  ? Diabetes Mother   ? Hypertension Mother   ? Breast cancer Mother 42  ?     recurrence age 11  ? Early death Mother   ? Hyperlipidemia Mother   ? Miscarriages / [place] Mother   ? Obesity Mother   ? Bipolar disorder Father   ? Cerebral palsy Brother   ? Epilepsy Brother   ? Diabetes Maternal Grandmother   ? Heart disease Maternal Grandmother   ?  Hyperlipidemia Maternal Grandmother   ? Hypertension Maternal Grandmother   ? Cancer Maternal Grandfather   ?     leukemia  ? Cancer Maternal Aunt 82  ?     uterine cancer, passed age 73  ? Ovarian cancer Maternal Great-grandmother   ?     passed aroung age 63  ? ? ?Social History  ? ?Socioeconomic History  ? Marital status: Single  ?  Spouse name: Not on file  ? Number of children: Not on file  ? Years of education: Not on file  ? Highest education level: Not on file  ?Occupational History  ? Not on file  ?Tobacco Use  ? Smoking status: Never  ? Smokeless tobacco: Never  ?Vaping Use  ? Vaping Use: Never used  ?Substance and Sexual Activity  ? Alcohol use: Not Currently  ? Drug use: Never  ? Sexual activity: Never  ?Other Topics Concern  ? Not on file  ?Social History Narrative  ? Not on file  ? ?Social Determinants of Health  ? ?Financial Resource Strain: Not on file  ?Food Insecurity: Not on file  ?Transportation Needs: Not on file  ?Physical Activity: Not on file  ?Stress: Not on file  ?Social Connections: Not on file  ?Intimate Partner Violence: Not on file  ? ? ?Review of Systems: Review of Systems  ?Constitutional:  Negative for chills and fever.  ?HENT:  Negative for hearing loss and tinnitus.   ?Eyes:  Negative for blurred vision and double vision.  ?Respiratory:  Negative for cough and hemoptysis.   ?Cardiovascular:  Negative for chest pain and palpitations.  ?Gastrointestinal:  Positive for abdominal pain, nausea and vomiting. Negative for blood in stool, constipation, diarrhea, heartburn and melena.  ?Genitourinary:  Negative for dysuria and urgency.  ?Musculoskeletal:  Negative for myalgias and neck pain.  ?Skin:  Negative for itching and rash.  ?Neurological:  Negative for seizures and loss of consciousness.  ?Psychiatric/Behavioral:  Negative for substance abuse. The patient is not nervous/anxious.    ? ?Physical Exam:Physical Exam ?Constitutional:   ?   Appearance: Normal appearance. She is normal  weight.  ?HENT:  ?   Head: Normocephalic and atraumatic.  ?   Nose: Nose normal. No congestion.  ?   Mouth/Throat:  ?   Mouth: Mucous membranes are moist.  ?   Pharynx: Oropharynx is clear.  ?Eyes:  ?   Extraocular Movements: Extraocular movements intact.  ?   Conjunctiva/sclera: Conjunctivae normal.  ?Cardiovascular:  ?   Rate and Rhythm: Normal rate and regular rhythm.  ?Pulmonary:  ?   Effort: Pulmonary effort is normal. No respiratory distress.  ?Abdominal:  ?   General: Abdomen is flat. Bowel sounds are normal. There is no distension.  ?   Palpations: Abdomen is soft. There is no mass.  ?   Tenderness: There  is no abdominal tenderness. There is no guarding or rebound.  ?   Hernia: No hernia is present.  ?Musculoskeletal:     ?   General: No swelling. Normal range of motion.  ?   Cervical back: Normal range of motion and neck supple.  ?Skin: ?   General: Skin is warm and dry.  ?Neurological:  ?   General: No focal deficit present.  ?   Mental Status: She is oriented to person, place, and time.  ?Psychiatric:     ?   Mood and Affect: Mood normal.     ?   Behavior: Behavior normal.     ?   Thought Content: Thought content normal.     ?   Judgment: Judgment normal.  ?  ?Vital signs: ?Vitals:  ? 06/03/21 2014 06/04/21 0508  ?BP: 126/80 129/86  ?Pulse: 94 75  ?Resp: 18 18  ?Temp: 99.4 ?F (37.4 ?C) 98.9 ?F (37.2 ?C)  ?SpO2: 100% 100%  ? ?Last BM Date : 06/02/21 ? ? ? ?GI:  ?Lab Results: ?Recent Labs  ?  06/02/21 ?1543 06/04/21 ?0411  ?WBC 7.9 8.5  ?HGB 13.9 11.7*  ?HCT 41.7 36.5  ?PLT 386 326  ? ?BMET ?Recent Labs  ?  06/02/21 ?1543 06/03/21 ?[id number] 06/04/21 ?0411  ?NA 139 138 138  ?K 3.8 3.6 3.9  ?CL 106 107 109  ?CO2 23 24 25   ?GLUCOSE 103* 93 78  ?BUN <5* <5* <5*  ?CREATININE 0.83 0.71 0.73  ?CALCIUM 10.2 9.5 9.3  ? ?LFT ?Recent Labs  ?  06/02/21 ?1543  ?PROT 8.4*  ?ALBUMIN 4.6  ?AST 21  ?ALT 16  ?ALKPHOS 60  ?BILITOT 0.7  ? ?PT/INR ?No results for input(s): LABPROT, INR in the last 72  hours. ? ? ?Studies/Results: ?CT ABDOMEN PELVIS W CONTRAST ? ?Result Date: 06/02/2021 ?CLINICAL DATA:  Abdominal pain, acute, nonlocalized EXAM: CT ABDOMEN AND PELVIS WITH CONTRAST TECHNIQUE: Multidetector CT imaging of the abdomen and pelvi

## 2021-06-05 ENCOUNTER — Encounter (HOSPITAL_COMMUNITY): Payer: Self-pay

## 2021-06-05 ENCOUNTER — Emergency Department (HOSPITAL_COMMUNITY)
Admission: EM | Admit: 2021-06-05 | Discharge: 2021-06-06 | Disposition: A | Discharge status: Home / Self Care | Payer: No Typology Code available for payment source | Attending: Emergency Medicine | Admitting: Emergency Medicine

## 2021-06-05 DIAGNOSIS — R1013 Epigastric pain: Secondary | ICD-10-CM | POA: Insufficient documentation

## 2021-06-05 DIAGNOSIS — N9489 Other specified conditions associated with female genital organs and menstrual cycle: Secondary | ICD-10-CM | POA: Insufficient documentation

## 2021-06-05 DIAGNOSIS — R110 Nausea: Secondary | ICD-10-CM

## 2021-06-05 DIAGNOSIS — R112 Nausea with vomiting, unspecified: Secondary | ICD-10-CM | POA: Diagnosis not present

## 2021-06-05 DIAGNOSIS — F419 Anxiety disorder, unspecified: Secondary | ICD-10-CM

## 2021-06-05 LAB — CBC WITH DIFFERENTIAL/PLATELET
Abs Immature Granulocytes: 0.01 10*3/uL (ref 0.00–0.07)
Basophils Absolute: 0 10*3/uL (ref 0.0–0.1)
Basophils Relative: 0 %
Eosinophils Absolute: 0.2 10*3/uL (ref 0.0–0.5)
Eosinophils Relative: 3 %
HCT: 40.4 % (ref 36.0–46.0)
Hemoglobin: 13 g/dL (ref 12.0–15.0)
Immature Granulocytes: 0 %
Lymphocytes Relative: 26 %
Lymphs Abs: 1.8 10*3/uL (ref 0.7–4.0)
MCH: 28.4 pg (ref 26.0–34.0)
MCHC: 32.2 g/dL (ref 30.0–36.0)
MCV: 88.2 fL (ref 80.0–100.0)
Monocytes Absolute: 0.7 10*3/uL (ref 0.1–1.0)
Monocytes Relative: 10 %
Neutro Abs: 4.4 10*3/uL (ref 1.7–7.7)
Neutrophils Relative %: 61 %
Platelets: 353 10*3/uL (ref 150–400)
RBC: 4.58 MIL/uL (ref 3.87–5.11)
RDW: 12.9 % (ref 11.5–15.5)
WBC: 7.2 10*3/uL (ref 4.0–10.5)
nRBC: 0 % (ref 0.0–0.2)

## 2021-06-05 LAB — BASIC METABOLIC PANEL
Anion gap: 7 (ref 5–15)
BUN: <5 mg/dL — ABNORMAL LOW (ref 6–20)
CO2: 24 mmol/L (ref 22–32)
Calcium: 9.4 mg/dL (ref 8.9–10.3)
Chloride: 109 mmol/L (ref 98–111)
Creatinine, Ser: 0.7 mg/dL (ref 0.44–1.00)
GFR, Estimated: >60 mL/min (ref >60)
Glucose, Bld: 79 mg/dL (ref 70–99)
Potassium: 4.1 mmol/L (ref 3.5–5.1)
Sodium: 140 mmol/L (ref 135–145)

## 2021-06-05 LAB — COMPREHENSIVE METABOLIC PANEL
ALT: 11 U/L (ref 0–44)
AST: 18 U/L (ref 15–41)
Albumin: 4.5 g/dL (ref 3.5–5.0)
Alkaline Phosphatase: 58 U/L (ref 38–126)
Anion gap: 9 (ref 5–15)
BUN: <5 mg/dL — ABNORMAL LOW (ref 6–20)
CO2: 21 mmol/L — ABNORMAL LOW (ref 22–32)
Calcium: 10 mg/dL (ref 8.9–10.3)
Chloride: 108 mmol/L (ref 98–111)
Creatinine, Ser: 0.79 mg/dL (ref 0.44–1.00)
GFR, Estimated: >60 mL/min (ref >60)
Glucose, Bld: 113 mg/dL — ABNORMAL HIGH (ref 70–99)
Potassium: 3.5 mmol/L (ref 3.5–5.1)
Sodium: 138 mmol/L (ref 135–145)
Total Bilirubin: 0.7 mg/dL (ref 0.3–1.2)
Total Protein: 8 g/dL (ref 6.5–8.1)

## 2021-06-05 LAB — I-STAT BETA HCG BLOOD, ED (MC, WL, AP ONLY): I-stat hCG, quantitative: 6.9 m[IU]/mL — ABNORMAL HIGH (ref <5)

## 2021-06-05 LAB — LIPASE, BLOOD: Lipase: 25 U/L (ref 11–51)

## 2021-06-05 MED ORDER — ENOXAPARIN SODIUM 40 MG/0.4ML IJ SOSY
40.0000 mg | PREFILLED_SYRINGE | INTRAMUSCULAR | Status: DC
  Filled 2021-06-05: qty 0.4

## 2021-06-05 MED ORDER — METOCLOPRAMIDE HCL 10 MG PO TABS: 10.0000 mg | ORAL_TABLET | Freq: Three times a day (TID) | ORAL | 0 refills | Status: DC | PRN

## 2021-06-05 MED ORDER — PANTOPRAZOLE SODIUM 40 MG PO TBEC: 40.0000 mg | DELAYED_RELEASE_TABLET | Freq: Every day | ORAL | 1 refills | Status: DC

## 2021-06-05 NOTE — ED Provider Triage Note (Signed)
Emergency Medicine Provider Triage Evaluation Note ? ?[name] , a 26 y.o. female  was evaluated in triage.  Pt complains of ongoing nausea and vomiting, admitted, dc today for hyperemesis but continues to have epigastric pain and vomiting, unable to keep anything down at home. Is taking Reglan and pantoprazole at home. States ativan and reglan in the hospital was working better. ? ?Review of Systems  ?Positive: N/v, abdominal pain ?Negative: Fever, decreased urination  ? ?Physical Exam  ?BP 120/84 (BP Location: Left Arm)   Pulse (!) 105   Temp 98.8 ?F (37.1 ?C) (Oral)   Resp 16   LMP 05/13/2021 (Exact Date)   SpO2 98%  ?Gen:   Awake, no distress   ?Resp:  Normal effort  ?MSK:   Moves extremities without difficulty  ?Other:   ? ?Medical Decision Making  ?Medically screening exam initiated at 9:45 PM.  Appropriate orders placed.  Anthonella Argeta was informed that the remainder of the evaluation will be completed by another provider, this initial triage assessment does not replace that evaluation, and the importance of remaining in the ED until their evaluation is complete. ? ? ?  ?Tacy Learn, PA-C ?06/05/21 2145 ? ?

## 2021-06-05 NOTE — Progress Notes (Signed)
[facility] Progress Note ? ?Kristina Blair 25 y.o. [birth year] ? ? ?Subjective: ?Recurrent nausea. Tolerating some liquids. Denies abdominal pain. PA in room. ? ?Objective: ?Vital signs: ?Vitals:  ? 06/04/21 2035 06/05/21 0451  ?BP: 130/84 135/76  ?Pulse: 72 90  ?Resp: 20 16  ?Temp: 98.3 ?F (36.8 ?C) 98.2 ?F (36.8 ?C)  ?SpO2: 100% 100%  ? ? ?Physical Exam: ?Gen: alert, no acute distress, thin ?HEENT: anicteric sclera ?CV: RRR ?Chest: CTA B ?Abd: epigastric tenderness with minimal guarding, soft, nondistended, +BS ?Ext: no edema ? ?Lab Results: ?Recent Labs  ?  06/04/21 ?0411 06/05/21 ?0436  ?NA 138 140  ?K 3.9 4.1  ?CL 109 109  ?CO2 25 24  ?GLUCOSE 78 79  ?BUN <5* <5*  ?CREATININE 0.73 0.70  ?CALCIUM 9.3 9.4  ?MG 2.0  --   ?PHOS 3.4  --   ? ?Recent Labs  ?  06/02/21 ?1543  ?AST 21  ?ALT 16  ?ALKPHOS 60  ?BILITOT 0.7  ?PROT 8.4*  ?ALBUMIN 4.6  ? ?Recent Labs  ?  06/02/21 ?1543 06/04/21 ?0411  ?WBC 7.9 8.5  ?HGB 13.9 11.7*  ?HCT 41.7 36.5  ?MCV 88.0 90.1  ?PLT 386 326  ? ? ? ? ?Assessment/Plan: ?Intractable N/V - slowly improving and feels that Metoclopramide helps. Continue full liquid diet and if no further vomiting advance tomorrow but if vomiting continues then d/c Metoclopramide tomorrow and do gastric emptying scan 06/07/21. ? ? ?Shirley Friar ?06/05/2021, 11:55 AM ? ?Questions please call [phone] Patient ID: Kristina Blair, female   DOB: [birth year], 26 y.o.   MRN: [id number] ? ?

## 2021-06-05 NOTE — Discharge Summary (Signed)
?Physician Discharge Summary ?  ?Patient: Kristina Blair MRN: [id number] DOB: Feb 21, 1996  ?Admit date:     06/02/2021  ?Discharge date: 06/05/21  ?Discharge Physician: Cipriano Bunker  ? ?PCP: de [place], Raymond J, MD  ? ?Recommendations at discharge:  ?Advised to follow-up with primary care physician in 1 week. ?Advised to follow-up with gastroenterology Dr. Bosie Clos as scheduled. ?Patient needs outpatient gastric emptying study ? ?Discharge Diagnoses: ?Principal Problem: ?  Intractable nausea and vomiting ?Active Problems: ?  Hypoglycemia ? ?Resolved Problems: ?  * No resolved hospital problems. * ? ?Hospital Course: ?This 26 years old female with PMH significant for SVT, ADHD, anxiety, depression presented in the ED with intractable nausea and vomiting for last 2 weeks. Patient has not been able to tolerate any food or drinks. Patient also reports dry heaving on empty stomach with bilious vomiting.  Patient also found to have hypoglycemic episodes where her fingerstick was was found to be 30.  She was seen for hypoglycemia at [facility].  Patient reports has previously seen endocrinology in January for hypoglycemia and night sweats,  reports having negative ACTH stimulation test , normal TSH and also wore freestyle libre showing some nocturnal hypoglycemia but further work-up was not completed.  Patient also reports syncopal episode secondary to hypoglycemia 4 days ago.  She also been following up with cardiology with extensive work-up including echo,  Zio patch with only finding of PVCs PACs of less than 1%. ?Patient was admitted for intractable nausea and vomiting. GI is consulted, recommended gastric emptying studies.  Patient was continued on Zofran, started on Reglan, continued on IV hydration.  She was started on clear liquid diet tolerated well advance to full liquids.  GI recommended gastric emptying studies but it can be done outpatient patient feels better want to be discharged.  Patient is being discharged  home/ ? ?Assessment and Plan: ?* Intractable nausea and vomiting ?She presented with 2-week history of intractable nausea and vomiting and inability to tolerate p.o. intake. ?Unclear etiology thus far CT abdomen and pelvis and right upper quadrant ultrasound negative.  No leukocytosis to suggest infective cause.  hCG is negative. ?Urine drug screen positive for marijuana. ?She reports has tried Zofran and Phenergan did not work. ?Droperidol given in the ED offered,  provided only minimal relief. ?PRN metoclopramide 10mg  qhr, Ativan 0.5mg  q6hr for refractory N/V ?Declined Maalox ordered by ED provider.  Trial of IV PPI ?She reports slight improvement was able to tolerate liquids. ?GI was consulted recommending gastric emptying studies for gastroparesis. ?Patient tolerated clear liquid diet advance to full liquids.  Patient feels better want to be discharged. ? ?Hypoglycemia ?Glucose is normal during hospitalization. ?Continue IV hydration. ?Last hemoglobin A1c 5.2. ? ? ?Consultants: Gastroenterology ?Procedures performed: None ?Disposition: Home ?Diet recommendation:  ?Discharge Diet Orders (From admission, onward)  ? ?  Start     Ordered  ? 06/05/21 0000  Diet - low sodium heart healthy       ? 06/05/21 1342  ? 06/05/21 0000  Diet full liquid       ? 06/05/21 1342  ? ?  ?  ? ?  ? ?Full liquid diet ?DISCHARGE MEDICATION: ?Allergies as of 06/05/2021   ? ?   Reactions  ? Kiwi Extract Other (See Comments)  ? Tongue itching ?Tongue itching  ? ?  ? ?  ?Medication List  ?  ? ?TAKE these medications   ? ?metoCLOPramide 10 MG tablet ?Commonly known as: REGLAN ?Take 1 tablet (10 mg  total) by mouth every 8 (eight) hours as needed for up to 15 days for nausea or refractory nausea / vomiting. ?  ?pantoprazole 40 MG tablet ?Commonly known as: Protonix ?Take 1 tablet (40 mg total) by mouth daily. ?  ?potassium chloride SA 20 MEQ tablet ?Commonly known as: KLOR-CON M ?Take 1 tablet (20 mEq total) by mouth daily. ?  ?promethazine 25  MG tablet ?Commonly known as: PHENERGAN ?Take 1 tablet (25 mg total) by mouth every 6 (six) hours as needed for nausea or vomiting. ?  ?propranolol 10 MG tablet ?Commonly known as: INDERAL ?Take 0.5 tablets (5 mg total) by mouth 3 (three) times daily as needed. ?What changed: reasons to take this ?  ? ?  ? ? Follow-up Information   ? ? de Peru, Buren Kos, MD Follow up in 1 week(s).   ?Specialty: Family Medicine ?Contact information: ?[address] ?[place] [address] ?[phone] ? ? ?  ?  ? ? Jodelle Red, MD .   ?Specialty: Cardiology ?Contact information: ?[address] ?[address] ?[place] [address] ?[phone] ? ? ?  ?  ? ? Charlott Rakes, MD Follow up in 1 week(s).   ?Specialty: Gastroenterology ?Contact information: ?[address] ?[address] ?[place] [address] ?[phone] ? ? ?  ?  ? ?  ?  ? ?  ? ?Discharge Exam: ?Ceasar Mons Weights  ? 06/03/21 0034  ?Weight: 52.5 kg  ? ? ?General exam: Appears comfortable, not in any acute distress. ?Respiratory system: CTA bilaterally, no wheezing, no crackles, normal respiratory effort ?Cardiovascular system: S1-S2 heard, regular rate and rhythm, no murmur. ?Gastrointestinal system: Abdomen is soft, nontender, nondistended, BS+ ?Central nervous system: Alert, oriented x 3, no focal neurological deficits. ?Extremities: No edema, no cyanosis, no clubbing. ?Psychiatry: Mood, insight, judgment normal. ? ? ?Condition at discharge: good ? ?The results of significant diagnostics from this hospitalization (including imaging, microbiology, ancillary and laboratory) are listed below for reference.  ? ?Imaging Studies: ?DG Chest 2 View ? ?Result Date: 05/30/2021 ?CLINICAL DATA:  Chest pain and palpitation. EXAM: CHEST - 2 VIEW COMPARISON:  Chest radiograph dated 05/14/2021. FINDINGS: The heart size and mediastinal contours are within normal limits. Both lungs are clear. The visualized skeletal structures are unremarkable. IMPRESSION: No active  cardiopulmonary disease. Electronically Signed   By: Elgie Collard M.D.   On: 05/30/2021 03:55  ? ?DG Chest 2 View ? ?Result Date: 05/10/2021 ?CLINICAL DATA:  Chest pain for 3 months radiating into the left shoulder, initial encounter EXAM: CHEST - 2 VIEW COMPARISON:  05/04/2021 FINDINGS: The heart size and mediastinal contours are within normal limits. Both lungs are clear. The visualized skeletal structures are unremarkable. IMPRESSION: No active cardiopulmonary disease. Electronically Signed   By: Alcide Clever M.D.   On: 05/10/2021 03:14  ? ?CT Head Wo Contrast ? ?Result Date: 05/31/2021 ?CLINICAL DATA:  Sudden onset headaches following syncopal episode, initial encounter EXAM: CT HEAD WITHOUT CONTRAST TECHNIQUE: Contiguous axial images were obtained from the base of the skull through the vertex without intravenous contrast. RADIATION DOSE REDUCTION: This exam was performed according to the departmental dose-optimization program which includes automated exposure control, adjustment of the mA and/or kV according to patient size and/or use of iterative reconstruction technique. COMPARISON:  None. FINDINGS: Brain: No evidence of acute infarction, hemorrhage, hydrocephalus, extra-axial collection or mass lesion/mass effect. Vascular: No hyperdense vessel or unexpected calcification. Skull: Normal. Negative for fracture or focal lesion. Sinuses/Orbits: No acute finding. Other: None. IMPRESSION: No acute intracranial abnormality  noted. Electronically Signed   By: Alcide CleverMark  Lukens M.D.   On: 05/31/2021 02:16  ? ?CT ABDOMEN PELVIS W CONTRAST ? ?Result Date: 06/02/2021 ?CLINICAL DATA:  Abdominal pain, acute, nonlocalized EXAM: CT ABDOMEN AND PELVIS WITH CONTRAST TECHNIQUE: Multidetector CT imaging of the abdomen and pelvis was performed using the standard protocol following bolus administration of intravenous contrast. RADIATION DOSE REDUCTION: This exam was performed according to the departmental dose-optimization program  which includes automated exposure control, adjustment of the mA and/or kV according to patient size and/or use of iterative reconstruction technique. CONTRAST:  100mL OMNIPAQUE IOHEXOL 300 MG/ML  SOLN COMPARISON:  CT

## 2021-06-05 NOTE — Plan of Care (Signed)

## 2021-06-05 NOTE — Discharge Instructions (Signed)
Advised to follow-up with primary care physician in 1 week. ?Advised to follow-up with gastroenterology Dr. Lavonia Drafts as scheduled. ?Patient needs outpatient gastric emptying study ?

## 2021-06-05 NOTE — ED Triage Notes (Signed)
Patient arrived stating she was admitted for NV and discharged today. No known cause for symptoms but told they are suspecting gastroparesis, scheduled for further testing this week. States she didn't feel ready for discharge and has been vomiting since.  ?

## 2021-06-06 ENCOUNTER — Ambulatory Visit (HOSPITAL_BASED_OUTPATIENT_CLINIC_OR_DEPARTMENT_OTHER): Payer: No Typology Code available for payment source | Admitting: Cardiology

## 2021-06-06 ENCOUNTER — Ambulatory Visit: Payer: No Typology Code available for payment source | Admitting: Gastroenterology

## 2021-06-06 LAB — URINALYSIS, ROUTINE W REFLEX MICROSCOPIC
Bilirubin Urine: NEGATIVE
Glucose, UA: NEGATIVE mg/dL
Hgb urine dipstick: NEGATIVE
Ketones, ur: 20 mg/dL — AB
Nitrite: NEGATIVE
Protein, ur: NEGATIVE mg/dL
Specific Gravity, Urine: 1.01 (ref 1.005–1.030)
pH: 7 (ref 5.0–8.0)

## 2021-06-06 LAB — HCG, SERUM, QUALITATIVE: Preg, Serum: NEGATIVE

## 2021-06-06 MED ORDER — LACTATED RINGERS IV SOLN: INTRAVENOUS | Status: DC

## 2021-06-06 MED ORDER — METOCLOPRAMIDE HCL 5 MG/ML IJ SOLN
10.0000 mg | Freq: Once | INTRAMUSCULAR | Status: AC
  Administered 2021-06-06: 10 mg via INTRAVENOUS
  Filled 2021-06-06: qty 2

## 2021-06-06 MED ORDER — LORAZEPAM 0.5 MG PO TABS
0.5000 mg | ORAL_TABLET | Freq: Once | ORAL | Status: AC
Start: 2021-06-06 — End: 2021-06-06
  Administered 2021-06-06: 0.5 mg via ORAL
  Filled 2021-06-06: qty 1

## 2021-06-06 MED ORDER — HYDROXYZINE HCL 25 MG PO TABS: 25.0000 mg | ORAL_TABLET | Freq: Four times a day (QID) | ORAL | 0 refills | Status: DC | PRN

## 2021-06-06 NOTE — ED Provider Notes (Signed)
?[facility]-EMERGENCY DEPT ?Provider Note ? ? ?CSN: [id number] ?Arrival date & time: 06/05/21  2121 ? ?  ? ?History ? ?Chief Complaint  ?Patient presents with  ? Emesis  ? ? ?Kristina Blair is a 26 y.o. female. ? ?The history is provided by the patient and medical records.  ?Emesis ?Kristina Blair is a 26 y.o. female who presents to the Emergency Department complaining of vomiting.  She presents to the ED for evaluation of vomiting for the last two weeks.  She was discharged from the hospital on 4/4 and after hospitalization she had recurrent vomiting (four episodes after drinking two boosts).   ? ?She gets epigastric pain as soon as she eats.  Has nausea.   ? ?No fever, sob.  No diarrhea, no constipation.  No dysuria.  No vaginal discharge.   ? ?No known medical problems.  No prior surgeries.   ? ?No tobacco.  No drugs.  No alcohol.   ? ?Has associated anxiety. ?  ? ?Home Medications ?Prior to Admission medications   ?Medication Sig Start Date End Date Taking? Authorizing Provider  ?hydrOXYzine (ATARAX) 25 MG tablet Take 1 tablet (25 mg total) by mouth every 6 (six) hours as needed for anxiety. 06/06/21  Yes Tilden Fossa, MD  ?metoCLOPramide (REGLAN) 10 MG tablet Take 1 tablet (10 mg total) by mouth every 8 (eight) hours as needed for up to 15 days for nausea or refractory nausea / vomiting. 06/05/21 06/20/21  Cipriano Bunker, MD  ?pantoprazole (PROTONIX) 40 MG tablet Take 1 tablet (40 mg total) by mouth daily. 06/05/21 06/05/22  Cipriano Bunker, MD  ?potassium chloride SA (KLOR-CON M) 20 MEQ tablet Take 1 tablet (20 mEq total) by mouth daily. 05/30/21   Petrucelli, Pleas Koch, PA-C  ?promethazine (PHENERGAN) 25 MG tablet Take 1 tablet (25 mg total) by mouth every 6 (six) hours as needed for nausea or vomiting. 05/10/21   Renne Crigler, PA-C  ?propranolol (INDERAL) 10 MG tablet Take 0.5 tablets (5 mg total) by mouth 3 (three) times daily as needed. ?Patient taking differently: Take 5 mg by mouth 3 (three) times  daily as needed (elevated heart rate). 06/01/21   Alver Sorrow, NP  ?   ? ?Allergies    ?Kiwi extract   ? ?Review of Systems   ?Review of Systems  ?Gastrointestinal:  Positive for vomiting.  ?All other systems reviewed and are negative. ? ?Physical Exam ?Updated Vital Signs ?BP 129/82   Pulse 96   Temp 98.8 ?F (37.1 ?C) (Oral)   Resp 16   LMP 05/13/2021 (Exact Date)   SpO2 100%  ?Physical Exam ?Vitals and nursing note reviewed.  ?Constitutional:   ?   Appearance: She is well-developed.  ?HENT:  ?   Head: Normocephalic and atraumatic.  ?Cardiovascular:  ?   Rate and Rhythm: Normal rate and regular rhythm.  ?Pulmonary:  ?   Effort: Pulmonary effort is normal. No respiratory distress.  ?Abdominal:  ?   Palpations: Abdomen is soft.  ?   Tenderness: There is no abdominal tenderness. There is no guarding or rebound.  ?Musculoskeletal:     ?   General: No swelling or tenderness.  ?Skin: ?   General: Skin is warm and dry.  ?Neurological:  ?   Mental Status: She is alert and oriented to person, place, and time.  ?Psychiatric:     ?   Behavior: Behavior normal.  ? ? ?ED Results / Procedures / Treatments   ?Labs ?(all labs ordered  are listed, but only abnormal results are displayed) ?Labs Reviewed  ?COMPREHENSIVE METABOLIC PANEL - Abnormal; Notable for the following components:  ?    Result Value  ? CO2 21 (*)   ? Glucose, Bld 113 (*)   ? BUN <5 (*)   ? All other components within normal limits  ?I-STAT BETA HCG BLOOD, ED ([facility], [facility], [facility] ONLY) - Abnormal; Notable for the following components:  ? I-stat hCG, quantitative 6.9 (*)   ? All other components within normal limits  ?CBC WITH DIFFERENTIAL/PLATELET  ?LIPASE, BLOOD  ?HCG, SERUM, QUALITATIVE  ?URINALYSIS, ROUTINE W REFLEX MICROSCOPIC  ? ? ?EKG ?None ? ?Radiology ?No results found. ? ?Procedures ?Procedures  ? ? ?Medications Ordered in ED ?Medications  ?lactated ringers infusion (0 mLs Intravenous Stopped 06/06/21 0351)  ?metoCLOPramide (REGLAN) injection 10 mg (10 mg  Intravenous Given 06/06/21 0326)  ?LORazepam (ATIVAN) tablet 0.5 mg (0.5 mg Oral Given 06/06/21 0349)  ? ? ?ED Course/ Medical Decision Making/ A&P ?  ?                        ?Medical Decision Making ?Amount and/or Complexity of Data Reviewed ?Labs: ordered. ? ?Risk ?Prescription drug management. ? ? ?Patient here for evaluation of nausea and vomiting in setting of recent admission for same symptoms.  On evaluation she is nontoxic-appearing with no significant abdominal tenderness.  She appears hydrated.  I-STAT hCG returned equivocal, formal pregnancy test is negative.  CBC CMP without acute abnormality.  Patient without vomiting during her ED stay.  On reassessment patient requests to go home.  She does report severe anxiety, has follow-up next Tuesday with psychiatry.  Offered additional resources for outpatient follow-up regarding her anxiety.  Will prescribe as needed hydroxyzine that she may try pending her psychiatry appointment.  Discussed return precautions for intractable vomiting, new symptoms. ? ?Clinical picture is not consistent with SBO, acute abdomen.  Patient discharged prior to urinalysis resulting. ? ? ? ? ? ? ? ?Final Clinical Impression(s) / ED Diagnoses ?Final diagnoses:  ?Nausea  ?Anxiety  ? ? ?Rx / DC Orders ?ED Discharge Orders   ? ?      Ordered  ?  hydrOXYzine (ATARAX) 25 MG tablet  Every 6 hours PRN       ? 06/06/21 0408  ? ?  ?  ? ?  ? ? ?  ?Tilden Fossa, MD ?06/06/21 [phone] ? ?

## 2021-06-06 NOTE — ED Notes (Signed)
Pt called out stating that she is feeling very hot states that she feels like she's having an anxiety attack and requested ativan. ?

## 2021-06-06 NOTE — ED Notes (Signed)
After speaking with patient regarding her increased anxiety and current symptoms, pt states that she feels like she needs to see somebody for her anxiety. Pt states that in the past, she feels her PCP has been dismissive of her concerns. Pt informed about behavioral health urgent care and requested clinic information upon discharge. Pt states that she feels like if she doesn't meet admission criteria, she'd rather just go home now than in a few hours. Dr Madilyn Hook updated. ?

## 2021-06-06 NOTE — ED Notes (Signed)
Discharge instructions reviewed, questions answered. Rx education provided. Pt states understanding and no further questions. Pt ambulatory with steady gait upon discharge. No s/s of distress noted. ? ?

## 2021-06-06 NOTE — ED Notes (Signed)
Pt called out and stated that she would like to be discharged or go home AMA. ?

## 2021-06-07 ENCOUNTER — Encounter (HOSPITAL_COMMUNITY): Payer: Self-pay

## 2021-06-07 ENCOUNTER — Emergency Department (HOSPITAL_COMMUNITY)
Admission: EM | Admit: 2021-06-07 | Discharge: 2021-06-07 | Discharge status: Against Medical Advice | Payer: No Typology Code available for payment source | Source: Home / Self Care | Attending: Emergency Medicine | Admitting: Emergency Medicine

## 2021-06-07 ENCOUNTER — Other Ambulatory Visit: Payer: Self-pay

## 2021-06-07 ENCOUNTER — Encounter (HOSPITAL_BASED_OUTPATIENT_CLINIC_OR_DEPARTMENT_OTHER): Payer: Self-pay | Admitting: Family Medicine

## 2021-06-07 ENCOUNTER — Encounter (HOSPITAL_BASED_OUTPATIENT_CLINIC_OR_DEPARTMENT_OTHER): Payer: Self-pay

## 2021-06-07 DIAGNOSIS — R197 Diarrhea, unspecified: Secondary | ICD-10-CM | POA: Insufficient documentation

## 2021-06-07 DIAGNOSIS — R112 Nausea with vomiting, unspecified: Secondary | ICD-10-CM | POA: Insufficient documentation

## 2021-06-07 DIAGNOSIS — R1013 Epigastric pain: Secondary | ICD-10-CM | POA: Insufficient documentation

## 2021-06-07 DIAGNOSIS — Z20822 Contact with and (suspected) exposure to covid-19: Secondary | ICD-10-CM | POA: Insufficient documentation

## 2021-06-07 DIAGNOSIS — R110 Nausea: Secondary | ICD-10-CM

## 2021-06-07 LAB — COMPREHENSIVE METABOLIC PANEL
ALT: 13 U/L (ref 0–44)
AST: 20 U/L (ref 15–41)
Albumin: 4.6 g/dL (ref 3.5–5.0)
Alkaline Phosphatase: 58 U/L (ref 38–126)
Anion gap: 9 (ref 5–15)
BUN: 5 mg/dL — ABNORMAL LOW (ref 6–20)
CO2: 24 mmol/L (ref 22–32)
Calcium: 10.2 mg/dL (ref 8.9–10.3)
Chloride: 105 mmol/L (ref 98–111)
Creatinine, Ser: 0.74 mg/dL (ref 0.44–1.00)
GFR, Estimated: >60 mL/min (ref >60)
Glucose, Bld: 84 mg/dL (ref 70–99)
Potassium: 3.7 mmol/L (ref 3.5–5.1)
Sodium: 138 mmol/L (ref 135–145)
Total Bilirubin: 0.8 mg/dL (ref 0.3–1.2)
Total Protein: 8.4 g/dL — ABNORMAL HIGH (ref 6.5–8.1)

## 2021-06-07 LAB — URINALYSIS, ROUTINE W REFLEX MICROSCOPIC
Bilirubin Urine: NEGATIVE
Bilirubin Urine: NEGATIVE
Glucose, UA: NEGATIVE mg/dL
Glucose, UA: NEGATIVE mg/dL
Hgb urine dipstick: NEGATIVE
Hgb urine dipstick: NEGATIVE
Ketones, ur: NEGATIVE mg/dL
Leukocytes,Ua: NEGATIVE
Nitrite: NEGATIVE
Nitrite: NEGATIVE
Protein, ur: NEGATIVE mg/dL
Protein, ur: 100 mg/dL — AB
Specific Gravity, Urine: 1.008 (ref 1.005–1.030)
Specific Gravity, Urine: 1.042 — ABNORMAL HIGH (ref 1.005–1.030)
pH: 7 (ref 5.0–8.0)
pH: 7 (ref 5.0–8.0)

## 2021-06-07 LAB — CBC WITH DIFFERENTIAL/PLATELET
Abs Immature Granulocytes: 0.04 10*3/uL (ref 0.00–0.07)
Basophils Absolute: 0 10*3/uL (ref 0.0–0.1)
Basophils Relative: 0 %
Eosinophils Absolute: 0.3 10*3/uL (ref 0.0–0.5)
Eosinophils Relative: 3 %
HCT: 42.7 % (ref 36.0–46.0)
Hemoglobin: 14.1 g/dL (ref 12.0–15.0)
Immature Granulocytes: 0 %
Lymphocytes Relative: 27 %
Lymphs Abs: 3 10*3/uL (ref 0.7–4.0)
MCH: 28.9 pg (ref 26.0–34.0)
MCHC: 33 g/dL (ref 30.0–36.0)
MCV: 87.5 fL (ref 80.0–100.0)
Monocytes Absolute: 1.1 10*3/uL — ABNORMAL HIGH (ref 0.1–1.0)
Monocytes Relative: 10 %
Neutro Abs: 6.6 10*3/uL (ref 1.7–7.7)
Neutrophils Relative %: 60 %
Platelets: 366 10*3/uL (ref 150–400)
RBC: 4.88 MIL/uL (ref 3.87–5.11)
RDW: 13 % (ref 11.5–15.5)
WBC: 11 10*3/uL — ABNORMAL HIGH (ref 4.0–10.5)
nRBC: 0 % (ref 0.0–0.2)

## 2021-06-07 LAB — RESP PANEL BY RT-PCR (FLU A&B, COVID) ARPGX2
Influenza A by PCR: NEGATIVE
Influenza B by PCR: NEGATIVE
SARS Coronavirus 2 by RT PCR: NEGATIVE

## 2021-06-07 LAB — LIPASE, BLOOD: Lipase: 24 U/L (ref 11–51)

## 2021-06-07 LAB — PREGNANCY, URINE: Preg Test, Ur: NEGATIVE

## 2021-06-07 MED ORDER — LACTATED RINGERS IV SOLN: INTRAVENOUS | Status: DC

## 2021-06-07 MED ORDER — METOCLOPRAMIDE HCL 5 MG/ML IJ SOLN
10.0000 mg | Freq: Once | INTRAMUSCULAR | Status: AC
  Administered 2021-06-07: 10 mg via INTRAVENOUS
  Filled 2021-06-07: qty 2

## 2021-06-07 MED ORDER — ONDANSETRON 4 MG PO TBDP: 4.0000 mg | ORAL_TABLET | Freq: Three times a day (TID) | ORAL | 0 refills | Status: DC | PRN

## 2021-06-07 NOTE — ED Triage Notes (Signed)
Pt reports nausea and vomiting x 3 weeks. Pt states that she is unable to keep anything down. Pt reports recently being admitted for the same issue.  ?

## 2021-06-07 NOTE — ED Provider Notes (Signed)
?[facility]-EMERGENCY DEPT ?Provider Note ? ? ?CSN: [id number] ?Arrival date & time: 06/07/21  0152 ? ?  ? ?History ? ?Chief Complaint  ?Patient presents with  ? Emesis  ? Nausea  ? ? ?Kristina Blair is a 26 y.o. female. ? ?The history is provided by the patient and medical records.  ?Emesis ?Kristina Blair is a 26 y.o. female who presents to the Emergency Department complaining of N/V.  She presents to the ED for evaluation of persistent N/V.  She states sxs started two weeks ago. She cannot keep down anything, including saliva.  ? ?No fever.  Has epigastric pain with eating.  Vomit looks like spit.  ?No constipation.  Had diarrhea yesterday.  No dysuria.   ? ?Discharged from the hospital 4/4 with reglan - takes it but vomits it.   ? ?States she does not feel anxious.  Lives with grandmother.   ? ?Grandmother with DM, colon cancer ?Mother died of breast cancer 4 years ago.   ?Uterine/ovarian aunt ? ?She is a triplet.  ?One brother with gastroparesis, epilepsy, MS, cerebral palsy ?Other brother is healthy.   ? ?No tobacco, alcohol, drugs. Grandmother uses marijuana.   ? ?Has follow up with GI April 21.  ? ?  ? ?Home Medications ?Prior to Admission medications   ?Medication Sig Start Date End Date Taking? Authorizing Provider  ?ondansetron (ZOFRAN-ODT) 4 MG disintegrating tablet Take 1 tablet (4 mg total) by mouth every 8 (eight) hours as needed for nausea or vomiting. 06/07/21  Yes Tilden Fossa, MD  ?hydrOXYzine (ATARAX) 25 MG tablet Take 1 tablet (25 mg total) by mouth every 6 (six) hours as needed for anxiety. 06/06/21   Tilden Fossa, MD  ?metoCLOPramide (REGLAN) 10 MG tablet Take 1 tablet (10 mg total) by mouth every 8 (eight) hours as needed for up to 15 days for nausea or refractory nausea / vomiting. 06/05/21 06/20/21  Cipriano Bunker, MD  ?pantoprazole (PROTONIX) 40 MG tablet Take 1 tablet (40 mg total) by mouth daily. 06/05/21 06/05/22  Cipriano Bunker, MD  ?potassium chloride SA (KLOR-CON M) 20 MEQ  tablet Take 1 tablet (20 mEq total) by mouth daily. 05/30/21   Petrucelli, Pleas Koch, PA-C  ?promethazine (PHENERGAN) 25 MG tablet Take 1 tablet (25 mg total) by mouth every 6 (six) hours as needed for nausea or vomiting. 05/10/21   Renne Crigler, PA-C  ?propranolol (INDERAL) 10 MG tablet Take 0.5 tablets (5 mg total) by mouth 3 (three) times daily as needed. ?Patient taking differently: Take 5 mg by mouth 3 (three) times daily as needed (elevated heart rate). 06/01/21   Alver Sorrow, NP  ?   ? ?Allergies    ?Kiwi extract   ? ?Review of Systems   ?Review of Systems  ?Gastrointestinal:  Positive for vomiting.  ?All other systems reviewed and are negative. ? ?Physical Exam ?Updated Vital Signs ?BP (!) 125/96 (BP Location: Right Arm)   Pulse 100   Temp 98.6 ?F (37 ?C) (Oral)   Resp 14   LMP 05/13/2021 (Exact Date)   SpO2 100%  ?Physical Exam ?Vitals and nursing note reviewed.  ?Constitutional:   ?   Appearance: She is well-developed.  ?HENT:  ?   Head: Normocephalic and atraumatic.  ?Cardiovascular:  ?   Rate and Rhythm: Normal rate and regular rhythm.  ?   Heart sounds: No murmur heard. ?Pulmonary:  ?   Effort: Pulmonary effort is normal. No respiratory distress.  ?   Breath sounds: Normal  breath sounds.  ?Abdominal:  ?   Palpations: Abdomen is soft.  ?   Tenderness: There is no abdominal tenderness. There is no guarding or rebound.  ?Musculoskeletal:     ?   General: No tenderness.  ?Skin: ?   General: Skin is warm and dry.  ?Neurological:  ?   Mental Status: She is alert and oriented to person, place, and time.  ?Psychiatric:     ?   Behavior: Behavior normal.  ? ? ?ED Results / Procedures / Treatments   ?Labs ?(all labs ordered are listed, but only abnormal results are displayed) ? ? ?EKG ?None ? ?Radiology ?No results found. ? ?Procedures ?Procedures  ? ? ?Medications Ordered in ED ?Medications  ?lactated ringers infusion (0 mLs Intravenous Stopped 06/07/21 0649)  ?metoCLOPramide (REGLAN) injection 10 mg (10  mg Intravenous Given 06/07/21 0610)  ? ? ?ED Course/ Medical Decision Making/ A&P ?  ?                        ?Medical Decision Making ?Amount and/or Complexity of Data Reviewed ?Labs: ordered. ? ?Risk ?Prescription drug management. ? ? ?Patient here for evaluation of nausea and vomiting.  On evaluation she is nontoxic-appearing.  She appears hydrated.  Given her report of recurrent vomiting we will check labs to evaluate for dehydration. ? ?Called to bedside to reassess the patient.  She has pulled out her IV and states that she wants to leave.  Suspect that she developed akathisia from the Reglan.  Patient refuses to await lab results.  No current clinical evidence of bowel obstruction or serious bacterial infection.  Feel she is able to refuse further evaluation.  Patient signed out AMA given her pending lab results. ? ? ? ? ? ? ? ?Final Clinical Impression(s) / ED Diagnoses ?Final diagnoses:  ?None  ? ? ?Rx / DC Orders ?ED Discharge Orders   ? ?      Ordered  ?  ondansetron (ZOFRAN-ODT) 4 MG disintegrating tablet  Every 8 hours PRN       ? 06/07/21 0644  ? ?  ?  ? ?  ? ? ?  ?Tilden Fossa, MD ?06/07/21 0710 ? ?

## 2021-06-07 NOTE — ED Triage Notes (Addendum)
Pt reports covid symptoms x 3 days associated with n/v.  Was seen at [facility] and left AMA. ?C/o of fever, cough, fatigue ?Also c/o dysuria ? ?

## 2021-06-08 ENCOUNTER — Encounter (HOSPITAL_COMMUNITY): Payer: Self-pay | Admitting: Emergency Medicine

## 2021-06-08 ENCOUNTER — Emergency Department (HOSPITAL_BASED_OUTPATIENT_CLINIC_OR_DEPARTMENT_OTHER)
Admission: EM | Admit: 2021-06-08 | Discharge: 2021-06-08 | Disposition: A | Discharge status: Home / Self Care | Payer: No Typology Code available for payment source | Source: Home / Self Care | Attending: Emergency Medicine | Admitting: Emergency Medicine

## 2021-06-08 ENCOUNTER — Emergency Department (HOSPITAL_COMMUNITY)
Admission: EM | Admit: 2021-06-08 | Discharge: 2021-06-08 | Disposition: A | Discharge status: Home / Self Care | Payer: No Typology Code available for payment source | Source: Home / Self Care | Attending: Emergency Medicine | Admitting: Emergency Medicine

## 2021-06-08 DIAGNOSIS — R112 Nausea with vomiting, unspecified: Secondary | ICD-10-CM

## 2021-06-08 DIAGNOSIS — Z79899 Other long term (current) drug therapy: Secondary | ICD-10-CM | POA: Insufficient documentation

## 2021-06-08 DIAGNOSIS — R197 Diarrhea, unspecified: Secondary | ICD-10-CM | POA: Insufficient documentation

## 2021-06-08 DIAGNOSIS — R1084 Generalized abdominal pain: Secondary | ICD-10-CM | POA: Insufficient documentation

## 2021-06-08 LAB — CBC WITH DIFFERENTIAL/PLATELET
Abs Immature Granulocytes: 0.03 10*3/uL (ref 0.00–0.07)
Basophils Absolute: 0 10*3/uL (ref 0.0–0.1)
Basophils Relative: 0 %
Eosinophils Absolute: 0.2 10*3/uL (ref 0.0–0.5)
Eosinophils Relative: 2 %
HCT: 40.8 % (ref 36.0–46.0)
Hemoglobin: 13.5 g/dL (ref 12.0–15.0)
Immature Granulocytes: 0 %
Lymphocytes Relative: 27 %
Lymphs Abs: 2.8 10*3/uL (ref 0.7–4.0)
MCH: 29.2 pg (ref 26.0–34.0)
MCHC: 33.1 g/dL (ref 30.0–36.0)
MCV: 88.1 fL (ref 80.0–100.0)
Monocytes Absolute: 1.7 10*3/uL — ABNORMAL HIGH (ref 0.1–1.0)
Monocytes Relative: 16 %
Neutro Abs: 5.7 10*3/uL (ref 1.7–7.7)
Neutrophils Relative %: 55 %
Platelets: 309 10*3/uL (ref 150–400)
RBC: 4.63 MIL/uL (ref 3.87–5.11)
RDW: 12.9 % (ref 11.5–15.5)
WBC: 10.5 10*3/uL (ref 4.0–10.5)
nRBC: 0 % (ref 0.0–0.2)

## 2021-06-08 LAB — COMPREHENSIVE METABOLIC PANEL
ALT: 12 U/L (ref 0–44)
AST: 18 U/L (ref 15–41)
Albumin: 4.4 g/dL (ref 3.5–5.0)
Alkaline Phosphatase: 59 U/L (ref 38–126)
Anion gap: 10 (ref 5–15)
BUN: 7 mg/dL (ref 6–20)
CO2: 24 mmol/L (ref 22–32)
Calcium: 9.5 mg/dL (ref 8.9–10.3)
Chloride: 104 mmol/L (ref 98–111)
Creatinine, Ser: 0.78 mg/dL (ref 0.44–1.00)
GFR, Estimated: >60 mL/min (ref >60)
Glucose, Bld: 84 mg/dL (ref 70–99)
Potassium: 3.5 mmol/L (ref 3.5–5.1)
Sodium: 138 mmol/L (ref 135–145)
Total Bilirubin: 0.7 mg/dL (ref 0.3–1.2)
Total Protein: 8.1 g/dL (ref 6.5–8.1)

## 2021-06-08 LAB — RAPID URINE DRUG SCREEN, HOSP PERFORMED
Amphetamines: NOT DETECTED
Barbiturates: NOT DETECTED
Benzodiazepines: NOT DETECTED
Cocaine: NOT DETECTED
Opiates: NOT DETECTED
Tetrahydrocannabinol: POSITIVE — AB

## 2021-06-08 LAB — CK: Total CK: 102 U/L (ref 38–234)

## 2021-06-08 MED ORDER — PROCHLORPERAZINE 25 MG RE SUPP: 25.0000 mg | Freq: Two times a day (BID) | RECTAL | 0 refills | Status: DC | PRN

## 2021-06-08 MED ORDER — ONDANSETRON 4 MG PO TBDP: 4.0000 mg | ORAL_TABLET | Freq: Three times a day (TID) | ORAL | 0 refills | Status: DC | PRN

## 2021-06-08 MED ORDER — DROPERIDOL 2.5 MG/ML IJ SOLN
2.5000 mg | Freq: Once | INTRAMUSCULAR | Status: AC
  Administered 2021-06-08: 2.5 mg via INTRAVENOUS
  Filled 2021-06-08: qty 2

## 2021-06-08 NOTE — Discharge Instructions (Signed)
I have prescribed medication in order to help with your nausea.  You may place 1 rectal suppository twice a day for the next ongoing days. ? ?Please follow-up with gastroenterology. ? ?Your UDS tested positive for marijuana today, please discontinue marijuana use as this could likely be causing your symptoms. ?

## 2021-06-08 NOTE — ED Triage Notes (Signed)
Patient c/o N/V x2 weeks. Denies pain at this time. ?

## 2021-06-08 NOTE — ED Provider Notes (Signed)
? ?[place] EMERGENCY DEPT  ?Provider Note ? ?CSN: [id number] ?Arrival date & time: 06/07/21 2119 ? ?History ?Chief Complaint  ?Patient presents with  ? covid symptoms  ? ? ?Kristina Blair is a 26 y.o. female has been sick for several weeks with frequent (daily) ED visits for nausea and vomiting. Was admitted for a few days last weekend and saw GI who questions CHS although she denies personal marijuana use and states her UDS was positive due to being around others who have been smoking. She also has a brother with gastroparesis and is scheduled for outpatient gastric emptying study. She was in the ED last night and left after getting Reglan due to the akathisia. She was concerned because she saw her WBC was elevated (11.0) on those labs and she questioned whether she might need some antibiotics. She has been taking Zofran at home with some improvement, has not vomiting in several hours. Tolerating PO fluids while waiting to be seen.  ? ? ?Home Medications ?Prior to Admission medications   ?Medication Sig Start Date End Date Taking? Authorizing Provider  ?hydrOXYzine (ATARAX) 25 MG tablet Take 1 tablet (25 mg total) by mouth every 6 (six) hours as needed for anxiety. 06/06/21   Quintella Reichert, MD  ?ondansetron (ZOFRAN-ODT) 4 MG disintegrating tablet Take 1 tablet (4 mg total) by mouth every 8 (eight) hours as needed for nausea or vomiting. 06/08/21   Truddie Hidden, MD  ?pantoprazole (PROTONIX) 40 MG tablet Take 1 tablet (40 mg total) by mouth daily. 06/05/21 06/05/22  Shawna Clamp, MD  ?potassium chloride SA (KLOR-CON M) 20 MEQ tablet Take 1 tablet (20 mEq total) by mouth daily. 05/30/21   Petrucelli, Glynda Jaeger, PA-C  ?promethazine (PHENERGAN) 25 MG tablet Take 1 tablet (25 mg total) by mouth every 6 (six) hours as needed for nausea or vomiting. 05/10/21   Carlisle Cater, PA-C  ?propranolol (INDERAL) 10 MG tablet Take 0.5 tablets (5 mg total) by mouth 3 (three) times daily as needed. ?Patient taking  differently: Take 5 mg by mouth 3 (three) times daily as needed (elevated heart rate). 06/01/21   Loel Dubonnet, NP  ? ? ? ?Allergies    ?Kiwi extract and Reglan [metoclopramide] ? ? ?Review of Systems   ?Review of Systems ?Please see HPI for pertinent positives and negatives ? ?Physical Exam ?BP 108/76   Pulse 80   Temp 98.9 ?F (37.2 ?C) (Tympanic)   Resp 16   Ht 5\' 1"  (1.549 m)   Wt 56.2 kg   LMP  (LMP Unknown)   SpO2 100%   BMI 23.43 kg/m?  ? ?Physical Exam ?Vitals and nursing note reviewed.  ?Constitutional:   ?   Appearance: Normal appearance.  ?HENT:  ?   Head: Normocephalic and atraumatic.  ?   Nose: Nose normal.  ?   Mouth/Throat:  ?   Mouth: Mucous membranes are moist.  ?Eyes:  ?   Extraocular Movements: Extraocular movements intact.  ?   Conjunctiva/sclera: Conjunctivae normal.  ?Cardiovascular:  ?   Rate and Rhythm: Normal rate.  ?Pulmonary:  ?   Effort: Pulmonary effort is normal.  ?   Breath sounds: Normal breath sounds.  ?Abdominal:  ?   General: Abdomen is flat.  ?   Palpations: Abdomen is soft.  ?   Tenderness: There is no abdominal tenderness. There is no guarding.  ?Musculoskeletal:     ?   General: No swelling. Normal range of motion.  ?   Cervical back:  Neck supple.  ?Skin: ?   General: Skin is warm and dry.  ?Neurological:  ?   General: No focal deficit present.  ?   Mental Status: She is alert.  ?Psychiatric:     ?   Mood and Affect: Mood normal.  ? ? ?ED Results / Procedures / Treatments   ?EKG ?None ? ?Procedures ?Procedures ? ?Medications Ordered in the ED ?Medications - No data to display ? ?Initial Impression and Plan ? Patient reassured she does not have any concerning findings on her exam and her borderline leukocytosis is nonspecific and not indicative of a bacterial infection. She was cautioned to drink/eat small volumes multiple times during the day. Avoid any cannabis exposure and follow up with GI as scheduled. Refilled Zofran.  ? ?ED Course  ? ?  ? ? ?MDM  Rules/Calculators/A&P ?Medical Decision Making ?Problems Addressed: ?Nausea and vomiting, unspecified vomiting type: chronic illness or injury with exacerbation, progression, or side effects of treatment ? ?Amount and/or Complexity of Data Reviewed ?Labs: ordered. Decision-making details documented in ED Course. ? ?Risk ?Prescription drug management. ? ? ? ?Final Clinical Impression(s) / ED Diagnoses ?Final diagnoses:  ?Nausea and vomiting, unspecified vomiting type  ? ? ?Rx / DC Orders ?ED Discharge Orders   ? ?      Ordered  ?  ondansetron (ZOFRAN-ODT) 4 MG disintegrating tablet  Every 8 hours PRN       ? 06/08/21 0117  ? ?  ?  ? ?  ? ?  ?Truddie Hidden, MD ?06/08/21 0117 ? ?

## 2021-06-08 NOTE — ED Provider Notes (Signed)
?[facility]-EMERGENCY DEPT ?Provider Note ? ? ?CSN: [id number] ?Arrival date & time: 06/08/21  1153 ? ?  ? ?History ? ?Chief Complaint  ?Patient presents with  ? Emesis  ? ? ?Kristina Blair is a 26 y.o. female. ? ?26 year old female with recurrent ED visits in the past week, last seen in the emergency department approximately 8 hours ago.  Presents this morning after multiple episodes of nausea, vomiting.  States she is "unable to keep anything down".  Patient reports arriving home around 4 AM, continues to have nausea and vomiting.  States her grandmother checked her sugar which showed a blood sugar of 61, reports this is very low for her.  Does not have any prior history of diabetes.  According to prior note.  GI saw her after her hospitalization and thought this was likely CHS, but she continues to deny any drug use.  So endorsing generalized abdominal pain with focalization around the umbilicus, exacerbated with any type of eating.  Was prescribed Reglan however states "cannot keep anything down". No fever, chest pain, urinary symptoms or vaginal discharge.  ? ? ? ? ? ? ? ?The history is provided by the patient and medical records.  ?Emesis ?Associated symptoms: abdominal pain and diarrhea   ?Associated symptoms: no chills, no fever and no sore throat   ? ?  ? ?Home Medications ?Prior to Admission medications   ?Medication Sig Start Date End Date Taking? Authorizing Provider  ?prochlorperazine (COMPAZINE) 25 MG suppository Place 1 suppository (25 mg total) rectally every 12 (twelve) hours as needed for up to 5 days for nausea or vomiting. 06/08/21 06/13/21 Yes Karington Zarazua, Leonie Douglas, PA-C  ?hydrOXYzine (ATARAX) 25 MG tablet Take 1 tablet (25 mg total) by mouth every 6 (six) hours as needed for anxiety. 06/06/21   Tilden Fossa, MD  ?ondansetron (ZOFRAN-ODT) 4 MG disintegrating tablet Take 1 tablet (4 mg total) by mouth every 8 (eight) hours as needed for nausea or vomiting. 06/08/21   Pollyann Savoy, MD   ?pantoprazole (PROTONIX) 40 MG tablet Take 1 tablet (40 mg total) by mouth daily. 06/05/21 06/05/22  Cipriano Bunker, MD  ?potassium chloride SA (KLOR-CON M) 20 MEQ tablet Take 1 tablet (20 mEq total) by mouth daily. 05/30/21   Petrucelli, Pleas Koch, PA-C  ?promethazine (PHENERGAN) 25 MG tablet Take 1 tablet (25 mg total) by mouth every 6 (six) hours as needed for nausea or vomiting. 05/10/21   Renne Crigler, PA-C  ?propranolol (INDERAL) 10 MG tablet Take 0.5 tablets (5 mg total) by mouth 3 (three) times daily as needed. ?Patient taking differently: Take 5 mg by mouth 3 (three) times daily as needed (elevated heart rate). 06/01/21   Alver Sorrow, NP  ?   ? ?Allergies    ?Kiwi extract and Reglan [metoclopramide]   ? ?Review of Systems   ?Review of Systems  ?Constitutional:  Negative for chills and fever.  ?HENT:  Negative for sore throat.   ?Respiratory:  Positive for shortness of breath.   ?Cardiovascular:  Negative for chest pain.  ?Gastrointestinal:  Positive for abdominal pain, diarrhea, nausea and vomiting. Negative for blood in stool.  ?Genitourinary:  Negative for flank pain.  ?Neurological:  Negative for light-headedness.  ?All other systems reviewed and are negative. ? ?Physical Exam ?Updated Vital Signs ?BP 101/62   Pulse 68   Temp 98 ?F (36.7 ?C) (Oral)   Resp (!) 21   LMP 05/13/2021   SpO2 95%  ?Physical Exam ?Vitals and nursing note reviewed.  ?  Constitutional:   ?   Appearance: Normal appearance. She is not ill-appearing.  ?   Comments: Non toxic appearance.   ?HENT:  ?   Head: Normocephalic and atraumatic.  ?   Mouth/Throat:  ?   Mouth: Mucous membranes are moist.  ?Eyes:  ?   Pupils: Pupils are equal, round, and reactive to light.  ?Cardiovascular:  ?   Rate and Rhythm: Normal rate.  ?Pulmonary:  ?   Effort: Pulmonary effort is normal.  ?   Comments: NO wheezing, rales or rhonchi.  ?Abdominal:  ?   General: Abdomen is flat.  ?   Palpations: Abdomen is soft.  ?   Tenderness: There is abdominal  tenderness. There is no right CVA tenderness or left CVA tenderness.  ?   Comments: No guarding, no rebound.   ?Musculoskeletal:  ?   Cervical back: Normal range of motion and neck supple.  ?Skin: ?   General: Skin is warm and dry.  ?Neurological:  ?   Mental Status: She is alert and oriented to person, place, and time.  ? ? ?ED Results / Procedures / Treatments   ?Labs ?(all labs ordered are listed, but only abnormal results are displayed) ?Labs Reviewed  ?CBC WITH DIFFERENTIAL/PLATELET - Abnormal; Notable for the following components:  ?    Result Value  ? Monocytes Absolute 1.7 (*)   ? All other components within normal limits  ?RAPID URINE DRUG SCREEN, HOSP PERFORMED - Abnormal; Notable for the following components:  ? Tetrahydrocannabinol POSITIVE (*)   ? All other components within normal limits  ?COMPREHENSIVE METABOLIC PANEL  ?CK  ? ? ?EKG ?EKG Interpretation ? ?Date/Time:  Friday June 08 2021 12:45:15 EDT ?Ventricular Rate:  74 ?PR Interval:  160 ?QRS Duration: 73 ?QT Interval:  366 ?QTC Calculation: 406 ?R Axis:   86 ?Text Interpretation: Sinus rhythm no acute ST/T changes T wave changes improved when compared to May 31 2021 Confirmed by Pricilla LovelessGoldston, Scott [phone]) on 06/08/2021 12:48:24 PM ? ?Radiology ?No results found. ? ?Procedures ?Procedures  ? ? ?Medications Ordered in ED ?Medications  ?droperidol (INAPSINE) 2.5 MG/ML injection 2.5 mg (2.5 mg Intravenous Given 06/08/21 1559)  ? ? ?ED Course/ Medical Decision Making/ A&P ?Clinical Course as of 06/08/21 1704  ?Fri Jun 08, 2021  ?1528 Tetrahydrocannabinol(!): POSITIVE [JS]  ?  ?Clinical Course User Index ?[JS] Claude MangesSoto, Cristian Davitt, PA-C  ? ?                        ?Medical Decision Making ?Amount and/or Complexity of Data Reviewed ?Labs: ordered. Decision-making details documented in ED Course. ? ?Risk ?Prescription drug management. ? ?This patient presents to the ED for concern of nausea., vomiting, diarrhea, this involves a number of treatment options, and is a  complaint that carries with it a high risk of complications and morbidity.  Patient with her fifth visit to the ER in the past 7 days.  Previously admitted for intractable nausea and vomiting.  Evaluated by gastroenterology who suspected CHS. ? ? ?Co morbidities: ?Discussed in HPI ? ? ?Brief History: ? ?Patient with her several consecutive visits to the ED for nausea, vomiting for the past 2 weeks.  Checked her blood sugar at home and noted that it was low at 61, does not have any underlying diabetes but reports her grandmother checked her blood sugar.  Prior gastroenterology evaluation which revealed she likely may have CHS, states that she does not smoke marijuana. ? ?EMR reviewed  including pt PMHx, past surgical history and past visits to ER.  ? ?See HPI for more details ? ? ?Lab Tests: ? ?I ordered and independently interpreted labs.  The pertinent results include:   ? ?I personally reviewed all laboratory work and imaging. Metabolic panel without any acute abnormality specifically kidney function within normal limits and no significant electrolyte abnormalities. CBC without leukocytosis or significant anemia. Ck within normal limits.  ? ? ?Imaging Studies: ? ?No imaging studies ordered for this patient ? ? ? ?Cardiac Monitoring: ? ?The patient was maintained on a cardiac monitor.  I personally viewed and interpreted the cardiac monitored which showed an underlying rhythm of: NSR ?EKG non-ischemic ?Qtc of 366, checked prior to droperidol  ? ? ?Medicines ordered: ? ?I ordered medication including Droperidol  for nausea and vomiting ?Reevaluation of the patient after these medicines showed that the patient improved ?I have reviewed the patients home medicines and have made adjustments as neede ? ?Reevaluation: ? ?After the interventions noted above I re-evaluated patient and found that they have :stayed the same ? ? ?Social Determinants of Health: ? ?The patient's social determinants of health were a factor in the  care of this patient ? ? ? ?Problem List / ED Course: ? ?Patient here with multiple visits for nausea, vomiting for the past 2 weeks.  Last discharged from the emergency department approximately 12 hours ago prior to

## 2021-06-09 ENCOUNTER — Inpatient Hospital Stay (HOSPITAL_COMMUNITY)
Admission: EM | Admit: 2021-06-09 | Discharge: 2021-06-14 | DRG: 392 | Disposition: A | Discharge status: Home / Self Care | Payer: No Typology Code available for payment source | Attending: Internal Medicine | Admitting: Internal Medicine

## 2021-06-09 ENCOUNTER — Encounter (HOSPITAL_COMMUNITY): Payer: Self-pay | Admitting: Emergency Medicine

## 2021-06-09 ENCOUNTER — Other Ambulatory Visit: Payer: Self-pay

## 2021-06-09 DIAGNOSIS — Z79899 Other long term (current) drug therapy: Secondary | ICD-10-CM

## 2021-06-09 DIAGNOSIS — K5900 Constipation, unspecified: Secondary | ICD-10-CM | POA: Diagnosis present

## 2021-06-09 DIAGNOSIS — F909 Attention-deficit hyperactivity disorder, unspecified type: Secondary | ICD-10-CM | POA: Diagnosis present

## 2021-06-09 DIAGNOSIS — F419 Anxiety disorder, unspecified: Secondary | ICD-10-CM | POA: Diagnosis present

## 2021-06-09 DIAGNOSIS — R112 Nausea with vomiting, unspecified: Principal | ICD-10-CM | POA: Diagnosis present

## 2021-06-09 DIAGNOSIS — Z820 Family history of epilepsy and other diseases of the nervous system: Secondary | ICD-10-CM

## 2021-06-09 DIAGNOSIS — Z6822 Body mass index (BMI) 22.0-22.9, adult: Secondary | ICD-10-CM

## 2021-06-09 DIAGNOSIS — Z83438 Family history of other disorder of lipoprotein metabolism and other lipidemia: Secondary | ICD-10-CM

## 2021-06-09 DIAGNOSIS — E876 Hypokalemia: Secondary | ICD-10-CM | POA: Diagnosis not present

## 2021-06-09 DIAGNOSIS — J45909 Unspecified asthma, uncomplicated: Secondary | ICD-10-CM | POA: Diagnosis present

## 2021-06-09 DIAGNOSIS — Z8249 Family history of ischemic heart disease and other diseases of the circulatory system: Secondary | ICD-10-CM

## 2021-06-09 DIAGNOSIS — F32A Depression, unspecified: Secondary | ICD-10-CM | POA: Diagnosis present

## 2021-06-09 DIAGNOSIS — K219 Gastro-esophageal reflux disease without esophagitis: Secondary | ICD-10-CM | POA: Diagnosis present

## 2021-06-09 DIAGNOSIS — Z833 Family history of diabetes mellitus: Secondary | ICD-10-CM

## 2021-06-09 DIAGNOSIS — F1290 Cannabis use, unspecified, uncomplicated: Secondary | ICD-10-CM | POA: Diagnosis present

## 2021-06-09 DIAGNOSIS — E440 Moderate protein-calorie malnutrition: Secondary | ICD-10-CM | POA: Diagnosis present

## 2021-06-09 DIAGNOSIS — Z20822 Contact with and (suspected) exposure to covid-19: Secondary | ICD-10-CM | POA: Diagnosis present

## 2021-06-09 LAB — COMPREHENSIVE METABOLIC PANEL
ALT: 10 U/L (ref 0–44)
AST: 17 U/L (ref 15–41)
Albumin: 4.2 g/dL (ref 3.5–5.0)
Alkaline Phosphatase: 57 U/L (ref 38–126)
Anion gap: 11 (ref 5–15)
BUN: <5 mg/dL — ABNORMAL LOW (ref 6–20)
CO2: 20 mmol/L — ABNORMAL LOW (ref 22–32)
Calcium: 9.7 mg/dL (ref 8.9–10.3)
Chloride: 106 mmol/L (ref 98–111)
Creatinine, Ser: 0.85 mg/dL (ref 0.44–1.00)
GFR, Estimated: >60 mL/min (ref >60)
Glucose, Bld: 113 mg/dL — ABNORMAL HIGH (ref 70–99)
Potassium: 3.4 mmol/L — ABNORMAL LOW (ref 3.5–5.1)
Sodium: 137 mmol/L (ref 135–145)
Total Bilirubin: 0.6 mg/dL (ref 0.3–1.2)
Total Protein: 7.6 g/dL (ref 6.5–8.1)

## 2021-06-09 LAB — CBC
HCT: 42.4 % (ref 36.0–46.0)
Hemoglobin: 13.6 g/dL (ref 12.0–15.0)
MCH: 28 pg (ref 26.0–34.0)
MCHC: 32.1 g/dL (ref 30.0–36.0)
MCV: 87.4 fL (ref 80.0–100.0)
Platelets: 332 10*3/uL (ref 150–400)
RBC: 4.85 MIL/uL (ref 3.87–5.11)
RDW: 12.8 % (ref 11.5–15.5)
WBC: 6.3 10*3/uL (ref 4.0–10.5)
nRBC: 0 % (ref 0.0–0.2)

## 2021-06-09 LAB — URINALYSIS, ROUTINE W REFLEX MICROSCOPIC
Bilirubin Urine: NEGATIVE
Glucose, UA: NEGATIVE mg/dL
Hgb urine dipstick: NEGATIVE
Ketones, ur: NEGATIVE mg/dL
Leukocytes,Ua: NEGATIVE
Nitrite: NEGATIVE
Protein, ur: NEGATIVE mg/dL
Specific Gravity, Urine: 1.001 — ABNORMAL LOW (ref 1.005–1.030)
pH: 6 (ref 5.0–8.0)

## 2021-06-09 LAB — I-STAT BETA HCG BLOOD, ED (MC, WL, AP ONLY): I-stat hCG, quantitative: 5.1 m[IU]/mL — ABNORMAL HIGH (ref <5)

## 2021-06-09 LAB — CBG MONITORING, ED: Glucose-Capillary: 86 mg/dL (ref 70–99)

## 2021-06-09 LAB — LIPASE, BLOOD: Lipase: 25 U/L (ref 11–51)

## 2021-06-09 MED ORDER — POTASSIUM CHLORIDE 10 MEQ/100ML IV SOLN
10.0000 meq | Freq: Once | INTRAVENOUS | Status: AC
  Administered 2021-06-10: 10 meq via INTRAVENOUS

## 2021-06-09 MED ORDER — DICYCLOMINE HCL 10 MG/ML IM SOLN
20.0000 mg | Freq: Once | INTRAMUSCULAR | Status: AC
  Administered 2021-06-09: 20 mg via INTRAMUSCULAR
  Filled 2021-06-09: qty 2

## 2021-06-09 MED ORDER — HYDROXYZINE HCL 25 MG PO TABS
25.0000 mg | ORAL_TABLET | Freq: Three times a day (TID) | ORAL | Status: DC | PRN
  Administered 2021-06-12: 25 mg via ORAL
  Filled 2021-06-09: qty 1

## 2021-06-09 MED ORDER — LORAZEPAM 2 MG/ML IJ SOLN
0.5000 mg | INTRAMUSCULAR | Status: DC | PRN
  Administered 2021-06-10 – 2021-06-12 (×3): 0.5 mg via INTRAVENOUS
  Filled 2021-06-09 (×3): qty 1

## 2021-06-09 MED ORDER — SODIUM CHLORIDE 0.9 % IV BOLUS
1000.0000 mL | Freq: Once | INTRAVENOUS | Status: AC
  Administered 2021-06-09: 1000 mL via INTRAVENOUS

## 2021-06-09 MED ORDER — ONDANSETRON HCL 4 MG/2ML IJ SOLN
4.0000 mg | Freq: Four times a day (QID) | INTRAMUSCULAR | Status: DC | PRN
  Administered 2021-06-10 – 2021-06-14 (×12): 4 mg via INTRAVENOUS
  Filled 2021-06-09 (×12): qty 2

## 2021-06-09 MED ORDER — ONDANSETRON HCL 4 MG/2ML IJ SOLN
4.0000 mg | Freq: Once | INTRAMUSCULAR | Status: AC
  Administered 2021-06-09: 4 mg via INTRAVENOUS
  Filled 2021-06-09: qty 2

## 2021-06-09 MED ORDER — LORAZEPAM 2 MG/ML IJ SOLN
1.0000 mg | Freq: Once | INTRAMUSCULAR | Status: DC
  Filled 2021-06-09: qty 1

## 2021-06-09 NOTE — ED Provider Notes (Signed)
?[facility] EMERGENCY DEPARTMENT ?Provider Note ? ? ?CSN: [id number] ?Arrival date & time: 06/09/21  1810 ? ?  ? ?History ? ?Chief Complaint  ?Patient presents with  ? Abdominal Pain  ? ? ?Kristina Blair is a 26 y.o. female with medical history significant for GERD, depression, asthma, anxiety.  Patient presents to ED complaining of nausea, vomiting and abdominal pain.  Patient reports that she has been present for the last 3 weeks.  Patient has been seen 9 times in the ER since 3/26.  The patient was admitted on 3/31 and kept in the hospital till 4/1 due to intractable nausea and vomiting.  Patient returns tonight with the same symptoms.  Patient was seen 2 times yesterday at [facility] as [facility] with reassuring work-ups.  Patient states that she has not smoked marijuana for the last 3 months, she has been seen by GI who believes that her abdominal pain, nausea and vomiting are due to [facility].  Patient had positive THC drug test yesterday at [facility].  Patient states that this is due to the fact that she has been "around people smoking weed".  Patient endorses nausea, vomiting, abdominal pain, anxiety.  Patient denies any fevers, shortness of breath, chest pain, blood in stool, diarrhea. ? ? ?Abdominal Pain ?Associated symptoms: nausea and vomiting   ?Associated symptoms: no chest pain, no constipation, no diarrhea, no fever and no shortness of breath   ? ?  ? ?Home Medications ?Prior to Admission medications   ?Medication Sig Start Date End Date Taking? Authorizing Provider  ?hydrOXYzine (ATARAX) 25 MG tablet Take 1 tablet (25 mg total) by mouth every 6 (six) hours as needed for anxiety. 06/06/21   Tilden Fossa, MD  ?ondansetron (ZOFRAN-ODT) 4 MG disintegrating tablet Take 1 tablet (4 mg total) by mouth every 8 (eight) hours as needed for nausea or vomiting. 06/08/21   Pollyann Savoy, MD  ?pantoprazole (PROTONIX) 40 MG tablet Take 1 tablet (40 mg total) by mouth daily. 06/05/21  06/05/22  Cipriano Bunker, MD  ?potassium chloride SA (KLOR-CON M) 20 MEQ tablet Take 1 tablet (20 mEq total) by mouth daily. 05/30/21   Petrucelli, Samantha R, PA-C  ?prochlorperazine (COMPAZINE) 25 MG suppository Place 1 suppository (25 mg total) rectally every 12 (twelve) hours as needed for up to 5 days for nausea or vomiting. 06/08/21 06/13/21  Claude Manges, PA-C  ?promethazine (PHENERGAN) 25 MG tablet Take 1 tablet (25 mg total) by mouth every 6 (six) hours as needed for nausea or vomiting. 05/10/21   Renne Crigler, PA-C  ?propranolol (INDERAL) 10 MG tablet Take 0.5 tablets (5 mg total) by mouth 3 (three) times daily as needed. ?Patient taking differently: Take 5 mg by mouth 3 (three) times daily as needed (elevated heart rate). 06/01/21   Alver Sorrow, NP  ?   ? ?Allergies    ?Kiwi extract and Reglan [metoclopramide]   ? ?Review of Systems   ?Review of Systems  ?Constitutional:  Negative for fever.  ?Respiratory:  Negative for shortness of breath.   ?Cardiovascular:  Negative for chest pain.  ?Gastrointestinal:  Positive for abdominal pain, nausea and vomiting. Negative for blood in stool, constipation and diarrhea.  ?Psychiatric/Behavioral:  The patient is nervous/anxious.   ?All other systems reviewed and are negative. ? ?Physical Exam ?Updated Vital Signs ?BP 119/65   Pulse 63   Temp 98.6 ?F (37 ?C) (Oral)   Resp 16   LMP 06/07/2021   SpO2 100%  ?  Physical Exam ?Vitals and nursing note reviewed.  ?Constitutional:   ?   General: She is not in acute distress. ?   Appearance: She is well-developed. She is not ill-appearing, toxic-appearing or diaphoretic.  ?HENT:  ?   Head: Normocephalic and atraumatic.  ?   Mouth/Throat:  ?   Mouth: Mucous membranes are moist.  ?   Pharynx: Oropharynx is clear.  ?Eyes:  ?   Extraocular Movements: Extraocular movements intact.  ?   Pupils: Pupils are equal, round, and reactive to light.  ?Cardiovascular:  ?   Rate and Rhythm: Normal rate and regular rhythm.  ?   Heart sounds:  Normal heart sounds.  ?Pulmonary:  ?   Effort: Pulmonary effort is normal. No respiratory distress.  ?   Breath sounds: Normal breath sounds. No wheezing.  ?Abdominal:  ?   General: Abdomen is flat. Bowel sounds are normal.  ?   Palpations: Abdomen is soft. There is no shifting dullness, fluid wave, hepatomegaly, splenomegaly, mass or pulsatile mass.  ?   Tenderness: There is generalized abdominal tenderness. There is no right CVA tenderness or left CVA tenderness.  ?Musculoskeletal:  ?   Cervical back: Normal range of motion and neck supple. No tenderness.  ?Skin: ?   General: Skin is warm and dry.  ?   Capillary Refill: Capillary refill takes less than 2 seconds.  ?Neurological:  ?   Mental Status: She is alert and oriented to person, place, and time.  ?Psychiatric:     ?   Mood and Affect: Mood is anxious.  ? ? ?ED Results / Procedures / Treatments   ?Labs ?(all labs ordered are listed, but only abnormal results are displayed) ?Labs Reviewed  ?COMPREHENSIVE METABOLIC PANEL - Abnormal; Notable for the following components:  ?    Result Value  ? Potassium 3.4 (*)   ? CO2 20 (*)   ? Glucose, Bld 113 (*)   ? BUN <5 (*)   ? All other components within normal limits  ?URINALYSIS, ROUTINE W REFLEX MICROSCOPIC - Abnormal; Notable for the following components:  ? Color, Urine COLORLESS (*)   ? Specific Gravity, Urine 1.001 (*)   ? Bacteria, UA RARE (*)   ? All other components within normal limits  ?I-STAT BETA HCG BLOOD, ED ([facility], [facility], [facility] ONLY) - Abnormal; Notable for the following components:  ? I-stat hCG, quantitative 5.1 (*)   ? All other components within normal limits  ?LIPASE, BLOOD  ?CBC  ?CBG MONITORING, ED  ? ? ?EKG ?None ? ?Radiology ?No results found. ? ?Procedures ?Procedures  ? ? ?Medications Ordered in ED ?Medications  ?LORazepam (ATIVAN) injection 1 mg (1 mg Intravenous Not Given 06/09/21 2223)  ?sodium chloride 0.9 % bolus 1,000 mL (1,000 mLs Intravenous New Bag/Given 06/09/21 2220)  ?ondansetron [facility](ZOFRAN)  injection 4 mg (4 mg Intravenous Given 06/09/21 2218)  ?dicyclomine (BENTYL) injection 20 mg (20 mg Intramuscular Given 06/09/21 2012)  ? ? ?ED Course/ Medical Decision Making/ A&P ?  ?                        ?Medical Decision Making ?Amount and/or Complexity of Data Reviewed ?Labs: ordered. ? ?Risk ?Prescription drug management. ?Decision regarding hospitalization. ? ? ?26 year old female presents ED for evaluation of nausea, vomiting abdominal pain for the ninth time in the last 2 weeks.  Please see HPI for further details. ? ?On examination, the patient is afebrile, nontachycardic, nonhypoxic.  The patient's lung  sounds are clear bilaterally.  Patient's abdomen is soft and compressible in all 4 quadrants however she does endorse generalized abdominal tenderness.  The patient is extremely anxious, she states that "no one listens to me" and she is requesting admission for intractable nausea and vomiting.  The patient was admitted from 3/30 to 4/1 for intractable nausea and vomiting.  The patient states that no antinausea medications relieve her of her symptoms.  Patient has been seen by GI who believes this is related to [facility] however the patient denies any recent marijuana usage over the last 3 months.  Patient had positive urine drug screen for THC day prior to today. ? ?Patient worked up utilizing the following labs and imaging studies interpreted by me personally: ?- CBC unremarkable ?- CMP shows slightly decreased potassium 3.4 ?- UA unremarkable ?- Lipase unremarkable ?- CBG 86 ? ?Patient given 1 L normal saline, 1 mg Ativan, 20 mg Bentyl, 4 mg Zofran.  Patient still states that she does not feel okay, still having abdominal pain and nausea and vomiting.  I asked the patient if she has vomited while in the department and she states that she has multiple times.  The nurse has no knowledge or recollection of this. ? ?I discussed the patient with hospitalist service, Dr. Cyndia Bent, she has agreed to admit the patient for  observation overnight.  The patient is agreeable to this plan.  The patient is stable at time of admission. ? ? ?Final Clinical Impression(s) / ED Diagnoses ?Final diagnoses:  ?Nausea and vomiting, unspecified v

## 2021-06-09 NOTE — ED Notes (Signed)
This RN attempted IV, pt anxious about IV and pulled away while attempted hand IV. Pt refusing any future IV unless IV team comes and does an [place]. IV team consult ordered. ?

## 2021-06-09 NOTE — ED Notes (Signed)
Pt requesting a CBG d/t feeling lightheaded. CBG WNL. ?

## 2021-06-09 NOTE — ED Triage Notes (Addendum)
Pt reports mid upper abd pain, nausea, and vomiting x 3 weeks.  States she was seen at [facility] for same.  Denies diarrhea. ?

## 2021-06-10 DIAGNOSIS — R112 Nausea with vomiting, unspecified: Secondary | ICD-10-CM | POA: Diagnosis present

## 2021-06-10 DIAGNOSIS — F419 Anxiety disorder, unspecified: Secondary | ICD-10-CM | POA: Diagnosis present

## 2021-06-10 DIAGNOSIS — Z79899 Other long term (current) drug therapy: Secondary | ICD-10-CM | POA: Diagnosis not present

## 2021-06-10 DIAGNOSIS — Z8249 Family history of ischemic heart disease and other diseases of the circulatory system: Secondary | ICD-10-CM | POA: Diagnosis not present

## 2021-06-10 DIAGNOSIS — F32A Depression, unspecified: Secondary | ICD-10-CM | POA: Diagnosis present

## 2021-06-10 DIAGNOSIS — Z20822 Contact with and (suspected) exposure to covid-19: Secondary | ICD-10-CM | POA: Diagnosis present

## 2021-06-10 DIAGNOSIS — E440 Moderate protein-calorie malnutrition: Secondary | ICD-10-CM | POA: Diagnosis present

## 2021-06-10 DIAGNOSIS — Z820 Family history of epilepsy and other diseases of the nervous system: Secondary | ICD-10-CM | POA: Diagnosis not present

## 2021-06-10 DIAGNOSIS — F1290 Cannabis use, unspecified, uncomplicated: Secondary | ICD-10-CM | POA: Diagnosis present

## 2021-06-10 DIAGNOSIS — Z833 Family history of diabetes mellitus: Secondary | ICD-10-CM | POA: Diagnosis not present

## 2021-06-10 DIAGNOSIS — K219 Gastro-esophageal reflux disease without esophagitis: Secondary | ICD-10-CM | POA: Diagnosis present

## 2021-06-10 DIAGNOSIS — E876 Hypokalemia: Secondary | ICD-10-CM

## 2021-06-10 DIAGNOSIS — F909 Attention-deficit hyperactivity disorder, unspecified type: Secondary | ICD-10-CM | POA: Diagnosis present

## 2021-06-10 DIAGNOSIS — F418 Other specified anxiety disorders: Secondary | ICD-10-CM | POA: Diagnosis not present

## 2021-06-10 DIAGNOSIS — R1013 Epigastric pain: Secondary | ICD-10-CM | POA: Diagnosis not present

## 2021-06-10 DIAGNOSIS — K5900 Constipation, unspecified: Secondary | ICD-10-CM | POA: Diagnosis present

## 2021-06-10 DIAGNOSIS — Z6822 Body mass index (BMI) 22.0-22.9, adult: Secondary | ICD-10-CM | POA: Diagnosis not present

## 2021-06-10 DIAGNOSIS — J45909 Unspecified asthma, uncomplicated: Secondary | ICD-10-CM | POA: Diagnosis present

## 2021-06-10 DIAGNOSIS — Z83438 Family history of other disorder of lipoprotein metabolism and other lipidemia: Secondary | ICD-10-CM | POA: Diagnosis not present

## 2021-06-10 LAB — BASIC METABOLIC PANEL
Anion gap: 4 — ABNORMAL LOW (ref 5–15)
BUN: <5 mg/dL — ABNORMAL LOW (ref 6–20)
CO2: 24 mmol/L (ref 22–32)
Calcium: 8.7 mg/dL — ABNORMAL LOW (ref 8.9–10.3)
Chloride: 112 mmol/L — ABNORMAL HIGH (ref 98–111)
Creatinine, Ser: 0.86 mg/dL (ref 0.44–1.00)
GFR, Estimated: >60 mL/min (ref >60)
Glucose, Bld: 96 mg/dL (ref 70–99)
Potassium: 3.9 mmol/L (ref 3.5–5.1)
Sodium: 140 mmol/L (ref 135–145)

## 2021-06-10 LAB — MAGNESIUM: Magnesium: 2 mg/dL (ref 1.7–2.4)

## 2021-06-10 MED ORDER — LACTATED RINGERS IV SOLN: INTRAVENOUS | Status: DC

## 2021-06-10 MED ORDER — ENSURE ENLIVE PO LIQD: 237.0000 mL | Freq: Two times a day (BID) | ORAL | Status: DC

## 2021-06-10 MED ORDER — ENOXAPARIN SODIUM 40 MG/0.4ML IJ SOSY
40.0000 mg | PREFILLED_SYRINGE | INTRAMUSCULAR | Status: DC
  Administered 2021-06-10 – 2021-06-13 (×4): 40 mg via SUBCUTANEOUS
  Filled 2021-06-10 (×4): qty 0.4

## 2021-06-10 MED ORDER — BOOST / RESOURCE BREEZE PO LIQD CUSTOM
1.0000 | Freq: Three times a day (TID) | ORAL | Status: DC
  Administered 2021-06-10 – 2021-06-13 (×2): 1 via ORAL

## 2021-06-10 MED ORDER — ORAL CARE MOUTH RINSE
15.0000 mL | Freq: Two times a day (BID) | OROMUCOSAL | Status: DC
  Administered 2021-06-10 – 2021-06-13 (×7): 15 mL via OROMUCOSAL

## 2021-06-10 NOTE — Assessment & Plan Note (Signed)
K of 3.4.  Repleted with IV K 10 mEq x 1 ?

## 2021-06-10 NOTE — Progress Notes (Signed)
?PROGRESS NOTE ? ? ? ?Kristina Blair  [id number] DOB: 1995/10/09 DOA: 06/09/2021 ?PCP: de [place], Raymond J, MD ? ? ?Brief Narrative:  ?Kristina Blair is a 26 y.o. female with medical history significant of SVT, ADHD, anxiety and depression who presents with intractable nausea and vomiting. Patient was just discharged 4 days ago with the same symptoms. Unclear if she was compliant with medication/diet but did not have outpatient follow up yet. ? ?Assessment & Plan: ?  ?Principal Problem: ?  Intractable nausea and vomiting ?Active Problems: ?  Anxiety and depression ?  Hypokalemia ? ?Intractable nausea and vomiting ?-Patient readmitted for similar problem, evaluated by GI earlier this week concern for hyperemesis cannabinoid syndrome versus gastroparesis ?-Gastric study currently pending ?-IV fluids ongoing until p.o. intake improves ?-Continue Zofran, hydroxyzine as needed -patient indicates Phenergan does not work and Reglan gives her anxiety ?-Pending emptying study will consider reevaluation with GI as indicated ?  ?Hypokalemia ?-Secondary to poor p.o. intake  ?  ?Anxiety and depression ?-Patient reports scheduled outpatient psych eval coming up. ?-Hydroxyzine ongoing ? ?DVT prophylaxis: Lovenox ?Code Status: Full ?Family Communication: None present ? ?Status is: Observation ? ?Dispo: The patient is from: Home ?             Anticipated d/c is to: Home ?             Anticipated d/c date is: 24 to 48 hours pending further evaluation and imaging ?             Patient currently not medically stable for discharge ? ?Consultants:  ?None ? ?Procedures:  ?None ? ?Antimicrobials:  ?None ? ?Subjective: ?Ongoing nausea without vomiting denies headache fever chills chest pain shortness of breath ? ?Objective: ?Vitals:  ? 06/10/21 0020 06/10/21 0100 06/10/21 0159 06/10/21 0437  ?BP: 114/73 116/70 124/72 104/64  ?Pulse: 76 74 80 84  ?Resp: 18 16 17 17   ?Temp:   98.3 ?F (36.8 ?C) 98.7 ?F (37.1 ?C)  ?TempSrc:   Oral Oral  ?SpO2: 100%  100% 100% 100%  ?Weight:   54.9 kg   ?Height:   5\' 1"  (1.549 m)   ? ? ?Intake/Output Summary (Last 24 hours) at 06/10/2021 0716 ?Last data filed at 06/10/2021 0120 ?Gross per 24 hour  ?Intake 1100 ml  ?Output --  ?Net 1100 ml  ? ?Filed Weights  ? 06/10/21 0159  ?Weight: 54.9 kg  ? ? ?Examination: ? ?General exam: Appears calm and comfortable  ?Respiratory system: Clear to auscultation. Respiratory effort normal. ?Cardiovascular system: S1 & S2 heard, RRR. No JVD, murmurs, rubs, gallops or clicks. No pedal edema. ?Gastrointestinal system: Abdomen is nondistended, soft and nontender. No organomegaly or masses felt. Normal bowel sounds heard. ?Central nervous system: Alert and oriented. No focal neurological deficits. ?Extremities: Symmetric 5 x 5 power. ?Skin: No rashes, lesions or ulcers ?Psychiatry: Judgement and insight appear normal. Mood & affect appropriate.  ? ? ? ?Data Reviewed: I have personally reviewed following labs and imaging studies ? ?CBC: ?Recent Labs  ?Lab 06/04/21 ?0411 06/05/21 ?2200 06/07/21 ?0610 06/08/21 ?1218 06/09/21 ?1832  ?WBC 8.5 7.2 11.0* 10.5 6.3  ?NEUTROABS  --  4.4 6.6 5.7  --   ?HGB 11.7* 13.0 14.1 13.5 13.6  ?HCT 36.5 40.4 42.7 40.8 42.4  ?MCV 90.1 88.2 87.5 88.1 87.4  ?PLT 326 353 366 309 332  ? ?Basic Metabolic Panel: ?Recent Labs  ?Lab 06/04/21 ?0411 06/05/21 ?0436 06/05/21 ?2200 06/07/21 ?0610 06/08/21 ?1218 06/09/21 ?1832 06/10/21 ?0305  ?NA 138   < >  138 138 138 137 140  ?K 3.9   < > 3.5 3.7 3.5 3.4* 3.9  ?CL 109   < > 108 105 104 106 112*  ?CO2 25   < > 21* 24 24 20* 24  ?GLUCOSE 78   < > 113* 84 84 113* 96  ?BUN <5*   < > <5* 5* 7 <5* <5*  ?CREATININE 0.73   < > 0.79 0.74 0.78 0.85 0.86  ?CALCIUM 9.3   < > 10.0 10.2 9.5 9.7 8.7*  ?MG 2.0  --   --   --   --   --  2.0  ?PHOS 3.4  --   --   --   --   --   --   ? < > = values in this interval not displayed.  ? ?GFR: ?Estimated Creatinine Clearance: 76.1 mL/min (by C-G formula based on SCr of 0.86 mg/dL). ?Liver Function Tests: ?Recent  Labs  ?Lab 06/05/21 ?2200 06/07/21 ?0610 06/08/21 ?1218 06/09/21 ?1832  ?AST 18 20 18 17   ?ALT 11 13 12 10   ?ALKPHOS 58 58 59 57  ?BILITOT 0.7 0.8 0.7 0.6  ?PROT 8.0 8.4* 8.1 7.6  ?ALBUMIN 4.5 4.6 4.4 4.2  ? ?Recent Labs  ?Lab 06/05/21 ?2200 06/07/21 ?0610 06/09/21 ?1832  ?LIPASE 25 24 25   ? ?No results for input(s): AMMONIA in the last 168 hours. ?Coagulation Profile: ?No results for input(s): INR, PROTIME in the last 168 hours. ?Cardiac Enzymes: ?Recent Labs  ?Lab 06/08/21 ?1218  ?CKTOTAL 102  ? ?BNP (last 3 results) ?No results for input(s): PROBNP in the last 8760 hours. ?HbA1C: ?No results for input(s): HGBA1C in the last 72 hours. ?CBG: ?Recent Labs  ?Lab 06/09/21 ?1930  ?GLUCAP 86  ? ?Lipid Profile: ?No results for input(s): CHOL, HDL, LDLCALC, TRIG, CHOLHDL, LDLDIRECT in the last 72 hours. ?Thyroid Function Tests: ?No results for input(s): TSH, T4TOTAL, FREET4, T3FREE, THYROIDAB in the last 72 hours. ?Anemia Panel: ?No results for input(s): VITAMINB12, FOLATE, FERRITIN, TIBC, IRON, RETICCTPCT in the last 72 hours. ?Sepsis Labs: ?No results for input(s): PROCALCITON, LATICACIDVEN in the last 168 hours. ? ?Recent Results (from the past 240 hour(s))  ?Resp Panel by RT-PCR (Flu A&B, Covid) Nasopharyngeal Swab     Status: None  ? Collection Time: 06/02/21  7:18 PM  ? Specimen: Nasopharyngeal Swab; Nasopharyngeal(NP) swabs in vial transport medium  ?Result Value Ref Range Status  ? SARS Coronavirus 2 by RT PCR NEGATIVE NEGATIVE Final  ?  Comment: (NOTE) ?SARS-CoV-2 target nucleic acids are NOT DETECTED. ? ?The SARS-CoV-2 RNA is generally detectable in upper respiratory ?specimens during the acute phase of infection. The lowest ?concentration of SARS-CoV-2 viral copies this assay can detect is ?138 copies/mL. A negative result does not preclude SARS-Cov-2 ?infection and should not be used as the sole basis for treatment or ?other patient management decisions. A negative result may occur with  ?improper specimen  collection/handling, submission of specimen other ?than nasopharyngeal swab, presence of viral mutation(s) within the ?areas targeted by this assay, and inadequate number of viral ?copies(<138 copies/mL). A negative result must be combined with ?clinical observations, patient history, and epidemiological ?information. The expected result is Negative. ? ?Fact Sheet for Patients:  ?EntrepreneurPulse.com.au ? ?Fact Sheet for Healthcare Providers:  ?IncredibleEmployment.be ? ?This test is no t yet approved or cleared by the [place] FDA and  ?has been authorized for detection and/or diagnosis of SARS-CoV-2 by ?FDA under an Emergency Use Authorization (EUA). This EUA will remain  ?  in effect (meaning this test can be used) for the duration of the ?COVID-19 declaration under Section 564(b)(1) of the Act, 21 ?U.S.C.section 360bbb-3(b)(1), unless the authorization is terminated  ?or revoked sooner.  ? ? ?  ? Influenza A by PCR NEGATIVE NEGATIVE Final  ? Influenza B by PCR NEGATIVE NEGATIVE Final  ?  Comment: (NOTE) ?The Xpert Xpress SARS-CoV-2/FLU/RSV plus assay is intended as an aid ?in the diagnosis of influenza from Nasopharyngeal swab specimens and ?should not be used as a sole basis for treatment. Nasal washings and ?aspirates are unacceptable for Xpert Xpress SARS-CoV-2/FLU/RSV ?testing. ? ?Fact Sheet for Patients: ?EntrepreneurPulse.com.au ? ?Fact Sheet for Healthcare Providers: ?IncredibleEmployment.be ? ?This test is not yet approved or cleared by the [place] FDA and ?has been authorized for detection and/or diagnosis of SARS-CoV-2 by ?FDA under an Emergency Use Authorization (EUA). This EUA will remain ?in effect (meaning this test can be used) for the duration of the ?COVID-19 declaration under Section 564(b)(1) of the Act, 21 U.S.C. ?section 360bbb-3(b)(1), unless the authorization is terminated or ?revoked. ? ?Performed at [facility], [address] ?[place], [place] [address] ?  ?Resp Panel by RT-PCR (Flu A&B, Covid) Nasopharyngeal Swab     Status: None  ? Collection Time: 06/07/21  9:31 PM  ? Specimen: Nasoph

## 2021-06-10 NOTE — H&P (Signed)
?History and Physical  ? ? ?Patient: Kristina Blair [id number] DOB: May 25, 1995 ?DOA: 06/09/2021 ?DOS: the patient was seen and examined on 06/10/2021 ?PCP: [place], Raymond J, MD  ?Patient coming from: Home ? ?Chief Complaint:  ?Chief Complaint  ?Patient presents with  ? Abdominal Pain  ? ?HPI: Kristina Blair is a 26 y.o. female with medical history significant of SVT, ADHD, anxiety and depression who presents with intractable nausea and vomiting. ? ?Patient was just discharged 4 days ago with the same symptoms.  She was evaluated by GI who thought her symptoms could be multifactorial from cannabinol hyperemesis syndrome versus gastroparesis versus esophagitis/gastritis.  Gastric emptying study was recommended to evaluate for gastroparesis.  However patient had improvement of symptoms and was discharged to follow-up outpatient.  Since discharge she has had persistent symptoms and presented to the ED daily following her discharge.  She was given hydroxyzine to take at home which has helpful for anxiety and nausea.  Work-up continues to be unremarkable at each ED eval.  Reports marijuana use over a week ago.  No alcohol use. ? ?In the ED, she was afebrile normotensive on room air.  CBC is unremarkable.  BMP with mild hypokalemia of 3.4.  Creatinine is normal at 0.85. ? ?She was given IV 4 mg Zofran, 20 mg of Bentyl and 1 mg of Ativan in the ED but continued to have persistent symptoms and refused p.o. challenge.  Hospitalist then called for admission. ?Review of Systems: As mentioned in the history of present illness. All other systems reviewed and are negative. ?Past Medical History:  ?Diagnosis Date  ? Allergy   ? Anxiety   ? Phreesia 02/13/2020  ? ASCUS of cervix with negative high risk HPV   ? Asthma   ? Phreesia 02/13/2020  ? Depression   ? [employer]reesia 02/13/2020  ? GERD (gastroesophageal reflux disease)   ? Phreesia 02/13/2020  ? IUD (intrauterine device) in place 09/2019  ? liletta  ? Paroxysmal SVT (supraventricular  tachycardia) (HCC)   ? ?Past Surgical History:  ?Procedure Laterality Date  ? WISDOM TOOTH EXTRACTION    ? ?Social History:  reports that she has never smoked. She has never used smokeless tobacco. She reports that she does not currently use alcohol. She reports that she does not use drugs. ? ?Allergies  ?Allergen Reactions  ? Kiwi Extract Other (See Comments)  ?  Tongue itching ?Tongue itching  ? Reglan [Metoclopramide] Anxiety  ? ? ?Family History  ?Problem Relation Age of Onset  ? Diabetes Mother   ? Hypertension Mother   ? Breast cancer Mother 32  ?     recurrence age 86  ? Early death Mother   ? Hyperlipidemia Mother   ? Miscarriages / [place] Mother   ? Obesity Mother   ? Bipolar disorder Father   ? Cerebral palsy Brother   ? Epilepsy Brother   ? Diabetes Maternal Grandmother   ? Heart disease Maternal Grandmother   ? Hyperlipidemia Maternal Grandmother   ? Hypertension Maternal Grandmother   ? Cancer Maternal Grandfather   ?     leukemia  ? Cancer Maternal Aunt 17  ?     uterine cancer, passed age 29  ? Ovarian cancer Maternal Great-grandmother   ?     passed aroung age 81  ? ? ?Prior to Admission medications   ?Medication Sig Start Date End Date Taking? Authorizing Provider  ?hydrOXYzine (ATARAX) 25 MG tablet Take 1 tablet (25 mg total) by mouth every 6 (  six) hours as needed for anxiety. 06/06/21   Quintella Reichert, MD  ?ondansetron (ZOFRAN-ODT) 4 MG disintegrating tablet Take 1 tablet (4 mg total) by mouth every 8 (eight) hours as needed for nausea or vomiting. 06/08/21   Truddie Hidden, MD  ?pantoprazole (PROTONIX) 40 MG tablet Take 1 tablet (40 mg total) by mouth daily. 06/05/21 06/05/22  Shawna Clamp, MD  ?potassium chloride SA (KLOR-CON M) 20 MEQ tablet Take 1 tablet (20 mEq total) by mouth daily. 05/30/21   Petrucelli, Samantha R, PA-C  ?prochlorperazine (COMPAZINE) 25 MG suppository Place 1 suppository (25 mg total) rectally every 12 (twelve) hours as needed for up to 5 days for nausea or vomiting.  06/08/21 06/13/21  Janeece Fitting, PA-C  ?promethazine (PHENERGAN) 25 MG tablet Take 1 tablet (25 mg total) by mouth every 6 (six) hours as needed for nausea or vomiting. 05/10/21   Carlisle Cater, PA-C  ?propranolol (INDERAL) 10 MG tablet Take 0.5 tablets (5 mg total) by mouth 3 (three) times daily as needed. ?Patient taking differently: Take 5 mg by mouth 3 (three) times daily as needed (elevated heart rate). 06/01/21   Loel Dubonnet, NP  ? ? ?Physical Exam: ?Vitals:  ? 06/09/21 2225 06/09/21 2330 06/10/21 0020 06/10/21 0100  ?BP: 119/65 115/72 114/73 116/70  ?Pulse: 63 92 76 74  ?Resp: 16 20 18 16   ?Temp:      ?TempSrc:      ?SpO2: 100% 100% 100% 100%  ? ?Constitutional: NAD, calm, comfortable, well-appearing young female laying in bed ?Eyes:  lids and conjunctivae normal ?ENMT: Mucous membranes are moist.  ?Neck: normal, supple,  ?Respiratory: clear to auscultation bilaterally, no wheezing, no crackles. Normal respiratory effort.  ?Cardiovascular: Regular rate and rhythm, no murmurs / rubs / gallops. No extremity edema.  ?Abdomen: Soft, nondistended no tenderness, no masses palpated.  No rebound tenderness, guarding or rigidity bowel sounds positive.  ?Musculoskeletal: no clubbing / cyanosis. No joint deformity upper and lower extremities. Good ROM, no contractures. Normal muscle tone.  ?Skin: no rashes, lesions, ulcers. No induration ?Neurologic: CN 2-12 grossly intact. Strength 5/5 in all 4.  ?Psychiatric: Normal judgment and insight. Alert and oriented x 3. Normal mood. ?Data Reviewed: ? ?See HPI ? ?Assessment and Plan: ?* Intractable nausea and vomiting ?-Has seen GI in last admission about 4 days ago and thought symptoms possibly multifactorial from cannabinol hyperemesis syndrome versus gastroparesis.  Gastric emptying study was recommended but patient had improvement in symptoms and was discharged to follow-up outpatient but has been having persistent symptoms. ?-PRN IV Zofran q6hr ?-PRN IV 0.5mg  ativan for  refractory nausea (has mentioned that Phenergan does not work and that Reglan gives her anxiety/restlessness) ?-Consider completing her gastric emptying study inpatient if symptoms improve to allow for testing ? ? ?Hypokalemia ?K of 3.4.  Repleted with IV K 10 mEq x 1 ? ?Anxiety and depression ?Suspect her anxiety is contributory to her symptoms.  She reports scheduled outpatient psych eval coming up. ?-prn hydroxyzine for anxiety ? ? ? ? ? Advance Care Planning:   Code Status: Full Code  ? ?Consults: None ? ?Family Communication: No family bedside ? ?Severity of Illness: ?The appropriate patient status for this patient is OBSERVATION. Observation status is judged to be reasonable and necessary in order to provide the required intensity of service to ensure the patient's safety. The patient's presenting symptoms, physical exam findings, and initial radiographic and laboratory data in the context of their medical condition is felt to place them at  decreased risk for further clinical deterioration. Furthermore, it is anticipated that the patient will be medically stable for discharge from the hospital within 2 midnights of admission.  ? ?Author: Orene Desanctis, DO ?06/10/2021 1:25 AM ? ?For on call review [email or web address].  ?

## 2021-06-10 NOTE — Plan of Care (Signed)

## 2021-06-10 NOTE — Assessment & Plan Note (Signed)
Suspect her anxiety is contributory to her symptoms.  She reports scheduled outpatient psych eval coming up. ?-prn hydroxyzine for anxiety ?

## 2021-06-10 NOTE — Assessment & Plan Note (Addendum)
-  Has seen GI in last admission about 4 days ago and thought symptoms possibly multifactorial from cannabinol hyperemesis syndrome versus gastroparesis.  Gastric emptying study was recommended but patient had improvement in symptoms and was discharged to follow-up outpatient but has been having persistent symptoms. ?-PRN IV Zofran q6hr ?-PRN IV 0.5mg  ativan for refractory nausea (has mentioned that Phenergan does not work and that Reglan gives her anxiety/restlessness) ?-Consider completing her gastric emptying study inpatient if symptoms improve to allow for testing ? ?

## 2021-06-11 ENCOUNTER — Ambulatory Visit: Payer: No Typology Code available for payment source | Admitting: Family Medicine

## 2021-06-11 ENCOUNTER — Inpatient Hospital Stay (HOSPITAL_COMMUNITY): Payer: No Typology Code available for payment source

## 2021-06-11 DIAGNOSIS — E440 Moderate protein-calorie malnutrition: Secondary | ICD-10-CM

## 2021-06-11 DIAGNOSIS — R112 Nausea with vomiting, unspecified: Secondary | ICD-10-CM | POA: Diagnosis not present

## 2021-06-11 HISTORY — DX: Moderate protein-calorie malnutrition: E44.0

## 2021-06-11 MED ORDER — TECHNETIUM TC 99M SULFUR COLLOID
2.0000 | Freq: Once | INTRAVENOUS | Status: AC | PRN
  Administered 2021-06-11: 2 via INTRAVENOUS

## 2021-06-11 MED ORDER — ADULT MULTIVITAMIN W/MINERALS CH
1.0000 | ORAL_TABLET | Freq: Every day | ORAL | Status: DC
  Administered 2021-06-11 – 2021-06-12 (×2): 1 via ORAL
  Filled 2021-06-11 (×2): qty 1

## 2021-06-11 MED ORDER — PROSOURCE PLUS PO LIQD
30.0000 mL | Freq: Three times a day (TID) | ORAL | Status: DC
  Filled 2021-06-11 (×2): qty 30

## 2021-06-11 NOTE — Progress Notes (Signed)
Pt transported to [facility] for gastric study ?

## 2021-06-11 NOTE — Discharge Summary (Incomplete)
Physician Discharge Summary  ?Kristina Blair KXF:[id number] DOB: [birth year] DOA: 06/09/2021 ? ?PCP: de [place], Raymond J, MD ? ?Admit date: 06/09/2021 ?Discharge date: 06/11/2021 ? ?Admitted From: *** ?Disposition:  *** ? ?Recommendations for Outpatient Follow-up:  ?Follow up with PCP in 1-2 weeks ?Please obtain BMP/CBC in one week ?Please follow up on the following pending results: ? ?Home Health:***  ?Equipment/Devices:*** ? ?Discharge Condition:***  ?CODE STATUS:***  ?Diet recommendation:   ? ?Brief/Interim Summary: ?*** ?Kristina Blair is a 26 y.o. female with medical history significant of SVT, ADHD, anxiety and depression who presents with intractable nausea and vomiting. Patient was just discharged 4 days ago with the same symptoms. Unclear if she was compliant with medication/diet but did not have outpatient follow up yet. ?  ? ?Discharge Diagnoses:  ?Principal Problem: ?  Intractable nausea and vomiting ?Active Problems: ?  Anxiety and depression ?  Hypokalemia ? ?Intractable nausea and vomiting ?-Patient readmitted for similar problem, evaluated by GI earlier this week concern for hyperemesis cannabinoid syndrome versus gastroparesis ?-Gastric study currently pending ?-IV fluids ongoing until p.o. intake improves ?-Continue Zofran, hydroxyzine as needed -patient indicates Phenergan does not work and Reglan gives her anxiety ?-Pending emptying study will consider reevaluation with GI as indicated ?  ?Hypokalemia ?-Secondary to poor p.o. intake  ?  ?Anxiety and depression ?-Patient reports scheduled outpatient psych eval coming up. ?-Hydroxyzine ongoing ? ?Discharge Instructions ? ? ?Allergies as of 06/11/2021   ? ?   Reactions  ? Kiwi Extract Itching, Other (See Comments)  ? Tongue itching  ? Reglan [metoclopramide] Anxiety  ? ?  ?Med Rec must be completed prior to using this SMARTLINK*** ? ? ? ? ? ? ?Allergies  ?Allergen Reactions  ? Kiwi Extract Itching and Other (See Comments)  ?  Tongue itching  ? Reglan  [Metoclopramide] Anxiety  ? ? ?Consultations: ?***Specify Physician/Group ? ? ?Procedures/Studies: ?DG Chest 2 View ? ?Result Date: 05/30/2021 ?CLINICAL DATA:  Chest pain and palpitation. EXAM: CHEST - 2 VIEW COMPARISON:  Chest radiograph dated 05/14/2021. FINDINGS: The heart size and mediastinal contours are within normal limits. Both lungs are clear. The visualized skeletal structures are unremarkable. IMPRESSION: No active cardiopulmonary disease. Electronically Signed   By: Elgie Collard M.D.   On: 05/30/2021 03:55  ? ?CT Head Wo Contrast ? ?Result Date: 05/31/2021 ?CLINICAL DATA:  Sudden onset headaches following syncopal episode, initial encounter EXAM: CT HEAD WITHOUT CONTRAST TECHNIQUE: Contiguous axial images were obtained from the base of the skull through the vertex without intravenous contrast. RADIATION DOSE REDUCTION: This exam was performed according to the departmental dose-optimization program which includes automated exposure control, adjustment of the mA and/or kV according to patient size and/or use of iterative reconstruction technique. COMPARISON:  None. FINDINGS: Brain: No evidence of acute infarction, hemorrhage, hydrocephalus, extra-axial collection or mass lesion/mass effect. Vascular: No hyperdense vessel or unexpected calcification. Skull: Normal. Negative for fracture or focal lesion. Sinuses/Orbits: No acute finding. Other: None. IMPRESSION: No acute intracranial abnormality noted. Electronically Signed   By: Alcide Clever M.D.   On: 05/31/2021 02:16  ? ?CT ABDOMEN PELVIS W CONTRAST ? ?Result Date: 06/02/2021 ?CLINICAL DATA:  Abdominal pain, acute, nonlocalized EXAM: CT ABDOMEN AND PELVIS WITH CONTRAST TECHNIQUE: Multidetector CT imaging of the abdomen and pelvis was performed using the standard protocol following bolus administration of intravenous contrast. RADIATION DOSE REDUCTION: This exam was performed according to the departmental dose-optimization program which includes automated  exposure control, adjustment of the mA and/or kV according to patient  size and/or use of iterative reconstruction technique. CONTRAST:  OMNIPAQUE IOHEXOL 300 MG/ML  SOLN COMPARISON:  CT 12/05/2020, right upper quadrant ultrasound 06/02/2021 FINDINGS: Lower chest: Included lung bases are clear.  Heart size is normal. Hepatobiliary: No focal liver abnormality is seen. No gallstones, gallbladder wall thickening, or biliary dilatation. Pancreas: Unremarkable. No pancreatic ductal dilatation or surrounding inflammatory changes. Spleen: Normal in size without focal abnormality. Adrenals/Urinary Tract: Unremarkable adrenal glands. Kidneys enhance symmetrically without focal lesion, stone, or hydronephrosis. Ureters are nondilated. Urinary bladder appears unremarkable for the degree of distension. Stomach/Bowel: Stomach is within normal limits. Appendix appears normal (series 4, image 63). No evidence of bowel wall thickening, distention, or inflammatory changes. Vascular/Lymphatic: No significant vascular findings are present. No enlarged abdominal or pelvic lymph nodes. Reproductive: Retroverted uterus containing IUD. Adnexal regions within normal limits for age. Other: No free fluid. No abdominopelvic fluid collection. No pneumoperitoneum. No abdominal wall hernia. Musculoskeletal: No acute or significant osseous findings. IMPRESSION: No acute abdominopelvic findings. Electronically Signed   By: Duanne Guess D.O.   On: 06/02/2021 18:06  ? ?DG Chest Portable 1 View ? ?Result Date: 05/14/2021 ?CLINICAL DATA:  Shortness of breath, headache, tightness in the chest. EXAM: PORTABLE CHEST 1 VIEW COMPARISON:  05/10/2021 FINDINGS: The heart size and mediastinal contours are within normal limits. Both lungs are clear. The visualized skeletal structures are unremarkable. IMPRESSION: No active disease. Electronically Signed   By: Burman Nieves M.D.   On: 05/14/2021 00:19  ? ?ECHOCARDIOGRAM COMPLETE ? ?Result Date:  05/14/2021 ?   ECHOCARDIOGRAM REPORT   Patient Name:   Kristina Blair Date of Exam: 05/14/2021 Medical Rec #:  [id number]   Height:       61.0 in Accession #:    [id number]  Weight:       129.0 lb Date of Birth:  August 09, 1995    BSA:          1.568 m? Patient Age:    25 years    BP:           111/69 mmHg Patient Gender: F           HR:           96 bpm. Exam Location:  Outpatient Procedure: 2D Echo, Cardiac Doppler and Color Doppler Indications:    R06.9 DOE; R07.2 Precordial pain; R55 Syncope  History:        Patient has no prior history of Echocardiogram examinations.                 Signs/Symptoms:Dyspnea, Chest Pain and Syncope; Risk                 Factors:Non-Smoker. Patient has had chest pain with DOE for                 about 6 months. She denies leg edema. She has had 2 episodes of                 syncope and contracted CoVid-19 03/2019.  Sonographer:    Carlos American RVT, RDCS (AE), RDMS Referring Phys: [phone] [facility]  Sonographer Comments: Technically difficult study due to poor echo windows. IMPRESSIONS  1. Left ventricular ejection fraction, by estimation, is 60 to 65%. The left ventricle has normal function. The left ventricle has no regional wall motion abnormalities. Left ventricular diastolic parameters were normal.  2. Right ventricular systolic function is normal. The right ventricular size is normal. There is normal pulmonary artery systolic  pressure. The estimated right ventricular systolic pressure is 21.5 mmHg.  3. The mitral valve is normal in structure. No evidence of mitral valve regurgitation.  4. The aortic valve is tricuspid. Aortic valve regurgitation is not visualized. No aortic stenosis is present.  5. The inferior vena cava is normal in size with greater than 50% respiratory variability, suggesting right atrial pressure of 3 mmHg. FINDINGS  Left Ventricle: Left ventricular ejection fraction, by estimation, is 60 to 65%. The left ventricle has normal function. The left ventricle has no  regional wall motion abnormalities. The left ventricular internal cavity size was small. There is no left ventricular hypertrophy. Left ventricular diastolic parameters were normal. Right Ventricle: The right ventricular

## 2021-06-11 NOTE — Progress Notes (Signed)
Spoke to [facility] Med department regarding ordered gastric emptying study. Notified them that pt has not been made NPO and had juice this morning. Requirement for test is strict NPO for 6 hours prior to test. NM will not be able to fit pt in for test today but will schedule her tomorrow morning. MD notified. ?

## 2021-06-11 NOTE — Progress Notes (Signed)
Initial Nutrition Assessment ? ?DOCUMENTATION CODES:  ?Non-severe (moderate) malnutrition in context of acute illness/injury ? ?INTERVENTION:  ?Continue clears after procedure, advance diet to full liquids as tolerated ?Pending diet advancement, recommend Boost Breeze po TID, each supplement provides 250 kcal and 9 grams of protein ?Prosource Plus TID to provide 100kcal and 15g of protein per packet ?When advanced to fulls, add Carnation Instant Breakfast TID- each packet provides 130kcal and 5g protein + whole milk ?MVI with minerals daily ? ?NUTRITION DIAGNOSIS:  ?Moderate Malnutrition (in the context of acute illness) related to vomiting, nausea as evidenced by mild muscle depletion, mild fat depletion, percent weight loss (6.1% x 1 month). ? ?GOAL:  ?Patient will meet greater than or equal to 90% of their needs ? ?MONITOR:  ?PO intake, Supplement acceptance, Diet advancement, I & O's ? ?REASON FOR ASSESSMENT:  ?Malnutrition Screening Tool ?  ? ?ASSESSMENT:  ?26 y.o. female with hx of GERD and depression/anxiety presented to ED with intractable nausea and vomiting after being discharged 4 days prior for similar presentation. Admitted to evaluate cause, cannabinol hyperemesis syndrome versus gastroparesis versus esophagitis/gastritis. ? ?Pt resting in bed at the time of assessment, currently NPO awaiting gastric emptying study.  ? ?Discussed recent nutrition hx. Pt reports that she has eaten very little over the past 3 weeks due to nausea and vomiting. Prior to GI distress, ate 2 meals, and several snacks daily. Usual weight of about 130 lbs, current weighs 121 lbs. Noted a 6.2% weight loss in the last month (3/9-4/9) which is severe. ? ?Discussed nutrition supplements pending diet advancement. Pt agreeable to boost breeze. Does not like ensure. Is agreeable to carnation instant breakfast (chocolate). Will monitor for advancement and tolerance.  ? ?Nutritionally Relevant Medications: ?Scheduled Meds: ? (feeding  supplement) PROSource Plus  30 mL Oral TID BM  ? feeding supplement  1 Container Oral TID BM  ? multivitamin with minerals  1 tablet Oral Daily  ? ?Continuous Infusions: ? lactated ringers 75 mL/hr at 06/11/21 0618  ? ?PRN Meds: ondansetron ? ?Labs Reviewed: ?Noted positive hCG serum level (5.1) on 4/8 ? ?NUTRITION - FOCUSED PHYSICAL EXAM: ?Flowsheet Row Most Recent Value  ?Orbital Region Mild depletion  ?Upper Arm Region No depletion  ?Thoracic and Lumbar Region Mild depletion  ?Buccal Region Mild depletion  ?Temple Region No depletion  ?Clavicle Bone Region Mild depletion  ?Clavicle and Acromion Bone Region Mild depletion  ?Scapular Bone Region No depletion  ?Dorsal Hand No depletion  ?Patellar Region Mild depletion  ?Anterior Thigh Region Mild depletion  ?Posterior Calf Region No depletion  ?Edema (RD Assessment) None  ?Hair Reviewed  ?Eyes Reviewed  ?Mouth Reviewed  ?Skin Reviewed  ?Nails Reviewed  ? ?Diet Order:   ?Diet Order   ? ?       ?  Diet clear liquid Room service appropriate? Yes; Fluid consistency: Thin  Diet effective now       ?  ? ?  ?  ? ?  ? ? ?EDUCATION NEEDS:  ?Education needs have been addressed ? ?Skin:  Skin Assessment: Reviewed RN Assessment ? ?Last BM:  4/9 ? ?Height:  ?Ht Readings from Last 1 Encounters:  ?06/10/21 5\' 1"  (1.549 m)  ? ? ?Weight:  ?Wt Readings from Last 1 Encounters:  ?06/10/21 54.9 kg  ? ? ?Ideal Body Weight:  47.7 kg ? ?BMI:  Body mass index is 22.87 kg/m?. ? ?Estimated Nutritional Needs:  ?Kcal:  1700-1900 kcal/d ?Protein:  85-100 g/d ?Fluid:  1.8-2 L/d ? ? ?  Ranell Patrick, RD, LDN ?Clinical Dietitian ?RD pager # available in [place]  ?After hours/weekend pager # available in [place] ?

## 2021-06-11 NOTE — Progress Notes (Signed)
?PROGRESS NOTE ? ? ? ?Rianne Degraaf  TDV:[id number] DOB: 1996/02/27 DOA: 06/09/2021 ?PCP: de [place], Raymond J, MD ? ? ?Brief Narrative:  ?Electa Sterry is a 26 y.o. female with medical history significant of SVT, ADHD, anxiety and depression who presents with intractable nausea and vomiting. Patient was just discharged 4 days ago with the same symptoms. Unclear if she was compliant with medication/diet but did not have outpatient follow up yet. ? ?Assessment & Plan: ?  ?Principal Problem: ?  Intractable nausea and vomiting ?Active Problems: ?  Anxiety and depression ?  Hypokalemia ?  Malnutrition of moderate degree ? ?Intractable nausea and vomiting ?-Patient readmitted for similar problem, evaluated by GI earlier this week concern for hyperemesis cannabinoid syndrome versus gastroparesis ?-Gastric study currently pending ?-IV fluids ongoing until p.o. intake improves ?-Continue Zofran, hydroxyzine as needed -patient indicates Phenergan does not work and Reglan gives her anxiety ?-Pending emptying study will consider reevaluation with GI as indicated ?  ?Hypokalemia ?-Secondary to poor p.o. intake  ?  ?Anxiety and depression ?-Patient reports scheduled outpatient psych eval coming up. ?-Hydroxyzine ongoing ? ?DVT prophylaxis: Lovenox ?Code Status: Full ?Family Communication: None present ? ?Status is: Observation ? ?Dispo: The patient is from: Home ?             Anticipated d/c is to: Home ?             Anticipated d/c date is: 24 to 48 hours pending further evaluation and imaging ?             Patient currently not medically stable for discharge ? ?Consultants:  ?None ? ?Procedures:  ?None ? ?Antimicrobials:  ?None ? ?Subjective: ?Ongoing nausea without vomiting denies headache fever chills chest pain shortness of breath ? ?Objective: ?Vitals:  ? 06/10/21 1528 06/10/21 2016 06/11/21 0533 06/11/21 0754  ?BP: (!) 107/57 109/71 107/67 101/60  ?Pulse: 65 72 71 86  ?Resp: 17 17 16 18   ?Temp: 98.2 ?F (36.8 ?C) 98.3 ?F (36.8 ?C)  98.5 ?F (36.9 ?C) 98.8 ?F (37.1 ?C)  ?TempSrc: Oral Oral Oral Oral  ?SpO2: 100% 100% 100% 100%  ?Weight:      ?Height:      ? ? ?Intake/Output Summary (Last 24 hours) at 06/11/2021 1520 ?Last data filed at 06/11/2021 08/11/2021 ?Gross per 24 hour  ?Intake 1027.45 ml  ?Output --  ?Net 1027.45 ml  ? ? ?Filed Weights  ? 06/10/21 0159  ?Weight: 54.9 kg  ? ? ?Examination: ? ?General exam: Appears calm and comfortable  ?Respiratory system: Clear to auscultation. Respiratory effort normal. ?Cardiovascular system: S1 & S2 heard, RRR. No JVD, murmurs, rubs, gallops or clicks. No pedal edema. ?Gastrointestinal system: Abdomen is nondistended, soft and nontender. No organomegaly or masses felt. Normal bowel sounds heard. ?Central nervous system: Alert and oriented. No focal neurological deficits. ?Extremities: Symmetric 5 x 5 power. ?Skin: No rashes, lesions or ulcers ?Psychiatry: Judgement and insight appear normal. Mood & affect appropriate.  ? ? ? ?Data Reviewed: I have personally reviewed following labs and imaging studies ? ?CBC: ?Recent Labs  ?Lab 06/05/21 ?2200 06/07/21 ?0610 06/08/21 ?1218 06/09/21 ?1832  ?WBC 7.2 11.0* 10.5 6.3  ?NEUTROABS 4.4 6.6 5.7  --   ?HGB 13.0 14.1 13.5 13.6  ?HCT 40.4 42.7 40.8 42.4  ?MCV 88.2 87.5 88.1 87.4  ?PLT 353 366 309 332  ? ? ?Basic Metabolic Panel: ?Recent Labs  ?Lab 06/05/21 ?2200 06/07/21 ?0610 06/08/21 ?1218 06/09/21 ?1832 06/10/21 ?0305  ?NA 138 138 138 137  140  ?K 3.5 3.7 3.5 3.4* 3.9  ?CL 108 105 104 106 112*  ?CO2 21* 24 24 20* 24  ?GLUCOSE 113* 84 84 113* 96  ?BUN <5* 5* 7 <5* <5*  ?CREATININE 0.79 0.74 0.78 0.85 0.86  ?CALCIUM 10.0 10.2 9.5 9.7 8.7*  ?MG  --   --   --   --  2.0  ? ? ?GFR: ?Estimated Creatinine Clearance: 76.1 mL/min (by C-G formula based on SCr of 0.86 mg/dL). ?Liver Function Tests: ?Recent Labs  ?Lab 06/05/21 ?2200 06/07/21 ?0610 06/08/21 ?1218 06/09/21 ?1832  ?AST 18 20 18 17   ?ALT 11 13 12 10   ?ALKPHOS 58 58 59 57  ?BILITOT 0.7 0.8 0.7 0.6  ?PROT 8.0 8.4* 8.1 7.6   ?ALBUMIN 4.5 4.6 4.4 4.2  ? ? ?Recent Labs  ?Lab 06/05/21 ?2200 06/07/21 ?0610 06/09/21 ?1832  ?LIPASE 25 24 25   ? ? ?No results for input(s): AMMONIA in the last 168 hours. ?Coagulation Profile: ?No results for input(s): INR, PROTIME in the last 168 hours. ?Cardiac Enzymes: ?Recent Labs  ?Lab 06/08/21 ?1218  ?CKTOTAL 102  ? ? ?BNP (last 3 results) ?No results for input(s): PROBNP in the last 8760 hours. ?HbA1C: ?No results for input(s): HGBA1C in the last 72 hours. ?CBG: ?Recent Labs  ?Lab 06/09/21 ?1930  ?GLUCAP 86  ? ? ?Lipid Profile: ?No results for input(s): CHOL, HDL, LDLCALC, TRIG, CHOLHDL, LDLDIRECT in the last 72 hours. ?Thyroid Function Tests: ?No results for input(s): TSH, T4TOTAL, FREET4, T3FREE, THYROIDAB in the last 72 hours. ?Anemia Panel: ?No results for input(s): VITAMINB12, FOLATE, FERRITIN, TIBC, IRON, RETICCTPCT in the last 72 hours. ?Sepsis Labs: ?No results for input(s): PROCALCITON, LATICACIDVEN in the last 168 hours. ? ?Recent Results (from the past 240 hour(s))  ?Resp Panel by RT-PCR (Flu A&B, Covid) Nasopharyngeal Swab     Status: None  ? Collection Time: 06/02/21  7:18 PM  ? Specimen: Nasopharyngeal Swab; Nasopharyngeal(NP) swabs in vial transport medium  ?Result Value Ref Range Status  ? SARS Coronavirus 2 by RT PCR NEGATIVE NEGATIVE Final  ?  Comment: (NOTE) ?SARS-CoV-2 target nucleic acids are NOT DETECTED. ? ?The SARS-CoV-2 RNA is generally detectable in upper respiratory ?specimens during the acute phase of infection. The lowest ?concentration of SARS-CoV-2 viral copies this assay can detect is ?138 copies/mL. A negative result does not preclude SARS-Cov-2 ?infection and should not be used as the sole basis for treatment or ?other patient management decisions. A negative result may occur with  ?improper specimen collection/handling, submission of specimen other ?than nasopharyngeal swab, presence of viral mutation(s) within the ?areas targeted by this assay, and inadequate number of  viral ?copies(<138 copies/mL). A negative result must be combined with ?clinical observations, patient history, and epidemiological ?information. The expected result is Negative. ? ?Fact Sheet for Patients:  ?BloggerCourse.comhttps://www.fda.gov/media/152166/download ? ?Fact Sheet for Healthcare Providers:  ?SeriousBroker.ithttps://www.fda.gov/media/152162/download ? ?This test is no t yet approved or cleared by the [place] FDA and  ?has been authorized for detection and/or diagnosis of SARS-CoV-2 by ?FDA under an Emergency Use Authorization (EUA). This EUA will remain  ?in effect (meaning this test can be used) for the duration of the ?COVID-19 declaration under Section 564(b)(1) of the Act, 21 ?U.S.C.section 360bbb-3(b)(1), unless the authorization is terminated  ?or revoked sooner.  ? ? ?  ? Influenza A by PCR NEGATIVE NEGATIVE Final  ? Influenza B by PCR NEGATIVE NEGATIVE Final  ?  Comment: (NOTE) ?The Xpert Xpress SARS-CoV-2/FLU/RSV plus assay is intended as  an aid ?in the diagnosis of influenza from Nasopharyngeal swab specimens and ?should not be used as a sole basis for treatment. Nasal washings and ?aspirates are unacceptable for Xpert Xpress SARS-CoV-2/FLU/RSV ?testing. ? ?Fact Sheet for Patients: ?BloggerCourse.com ? ?Fact Sheet for Healthcare Providers: ?SeriousBroker.it ? ?This test is not yet approved or cleared by the [place] FDA and ?has been authorized for detection and/or diagnosis of SARS-CoV-2 by ?FDA under an Emergency Use Authorization (EUA). This EUA will remain ?in effect (meaning this test can be used) for the duration of the ?COVID-19 declaration under Section 564(b)(1) of the Act, 21 U.S.C. ?section 360bbb-3(b)(1), unless the authorization is terminated or ?revoked. ? ?Performed at [facility], [address] ?[place], [place] [address] ?  ?Resp Panel by RT-PCR (Flu A&B, Covid) Nasopharyngeal Swab     Status: None  ? Collection Time:  06/07/21  9:31 PM  ? Specimen: Nasopharyngeal Swab; Nasopharyngeal(NP) swabs in vial transport medium  ?Result Value Ref Range Status  ? SARS Coronavirus 2 by RT PCR NEGATIVE NEGATIVE Final  ?  Comment: (NOT

## 2021-06-12 ENCOUNTER — Ambulatory Visit: Payer: No Typology Code available for payment source | Admitting: Neurology

## 2021-06-12 DIAGNOSIS — R112 Nausea with vomiting, unspecified: Secondary | ICD-10-CM | POA: Diagnosis not present

## 2021-06-12 LAB — LIPASE, BLOOD: Lipase: 24 U/L (ref 11–51)

## 2021-06-12 MED ORDER — PANTOPRAZOLE SODIUM 40 MG PO TBEC
40.0000 mg | DELAYED_RELEASE_TABLET | Freq: Every day | ORAL | Status: DC
  Administered 2021-06-12 – 2021-06-14 (×3): 40 mg via ORAL
  Filled 2021-06-12 (×3): qty 1

## 2021-06-12 MED ORDER — LACTATED RINGERS IV SOLN: INTRAVENOUS | Status: DC

## 2021-06-12 NOTE — Progress Notes (Signed)
?PROGRESS NOTE ? ? ? ?Kristina Blair  XBM:[id number] DOB: 1995-11-13 DOA: 06/09/2021 ?PCP: de [place], Raymond J, MD ? ? ?Brief Narrative:  ?Kristina Blair is a 26 y.o. female with medical history significant of SVT, ADHD, anxiety and depression who presents with intractable nausea and vomiting. Patient was just discharged 4 days ago with the same symptoms. Unclear if she was compliant with medication/diet but did not have outpatient follow up yet. ? ?Assessment & Plan: ?  ?Principal Problem: ?  Intractable nausea and vomiting ?Active Problems: ?  Anxiety and depression ?  Hypokalemia ?  Malnutrition of moderate degree ? ?Intractable nausea and vomiting ?-Patient readmitted for similar problem, evaluated by GI earlier this week concern for hyperemesis cannabinoid syndrome versus gastroparesis ?-Gastric study WNL ?-Denies any ongoing cannabinoid use ?-IV fluids held for 24 hours hoping patient's p.o. status would improve, unfortunately she continues to be unable to tolerate p.o. safely, at this time she is unwilling to even try to take p.o. due to ongoing pain and recurrent nausea ?-IV fluids restarted 4/11 secondary to poor PO intake ?-Continue Zofran, patient indicates Phenergan does not work and Reglan gives her anxiety and cannot tolerate this. ?  ?Hypokalemia ?-Secondary to poor p.o. intake  ?  ?Anxiety and depression ?-Patient reports scheduled outpatient psych eval coming up. ?-Hydroxyzine ongoing ? ?DVT prophylaxis: Lovenox ?Code Status: Full ?Family Communication: None present ? ?Status is: Inpatient ? ?Dispo: The patient is from: Home ?             Anticipated d/c is to: Home ?             Anticipated d/c date is: 24 to 48 hours pending further evaluation and imaging ?             Patient currently not medically stable for discharge ? ?Consultants:  ?GI ? ?Procedures:  ?None ? ?Antimicrobials:  ?None ? ?Subjective: ?Ongoing nausea/abdominal pain - without vomiting denies headache fever chills chest pain shortness of  breath ? ?Objective: ?Vitals:  ? 06/11/21 1820 06/11/21 2204 06/12/21 0327 06/12/21 0727  ?BP: (!) 99/59 (!) 102/55 (!) 103/56 113/74  ?Pulse: 64 60 80 73  ?Resp: 18 18 18 18   ?Temp: 98.2 ?F (36.8 ?C) 98.2 ?F (36.8 ?C) 98.9 ?F (37.2 ?C) 98.3 ?F (36.8 ?C)  ?TempSrc: Oral Oral Oral   ?SpO2: 99% 100% 100% 100%  ?Weight:      ?Height:      ? ? ?Intake/Output Summary (Last 24 hours) at 06/12/2021 1533 ?Last data filed at 06/12/2021 1000 ?Gross per 24 hour  ?Intake 1527.06 ml  ?Output --  ?Net 1527.06 ml  ? ?Filed Weights  ? 06/10/21 0159  ?Weight: 54.9 kg  ? ? ?Examination: ? ?General exam: Appears calm and comfortable  ?Respiratory system: Clear to auscultation. Respiratory effort normal. ?Cardiovascular system: S1 & S2 heard, RRR. No JVD, murmurs, rubs, gallops or clicks. No pedal edema. ?Gastrointestinal system: Abdomen is nondistended, soft and nontender. No organomegaly or masses felt. Normal bowel sounds heard. ?Central nervous system: Alert and oriented. No focal neurological deficits. ?Extremities: Symmetric 5 x 5 power. ?Skin: No rashes, lesions or ulcers ?Psychiatry: Judgement and insight appear normal. Mood & affect appropriate.  ? ?Data Reviewed: I have personally reviewed following labs and imaging studies ? ?CBC: ?Recent Labs  ?Lab 06/05/21 ?2200 06/07/21 ?0610 06/08/21 ?1218 06/09/21 ?1832  ?WBC 7.2 11.0* 10.5 6.3  ?NEUTROABS 4.4 6.6 5.7  --   ?HGB 13.0 14.1 13.5 13.6  ?HCT 40.4 42.7 40.8  42.4  ?MCV 88.2 87.5 88.1 87.4  ?PLT 353 366 309 332  ? ?Basic Metabolic Panel: ?Recent Labs  ?Lab 06/05/21 ?2200 06/07/21 ?0610 06/08/21 ?1218 06/09/21 ?1832 06/10/21 ?0305  ?NA 138 138 138 137 140  ?K 3.5 3.7 3.5 3.4* 3.9  ?CL 108 105 104 106 112*  ?CO2 21* 24 24 20* 24  ?GLUCOSE 113* 84 84 113* 96  ?BUN <5* 5* 7 <5* <5*  ?CREATININE 0.79 0.74 0.78 0.85 0.86  ?CALCIUM 10.0 10.2 9.5 9.7 8.7*  ?MG  --   --   --   --  2.0  ? ?GFR: ?Estimated Creatinine Clearance: 76.1 mL/min (by C-G formula based on SCr of 0.86  mg/dL). ?Liver Function Tests: ?Recent Labs  ?Lab 06/05/21 ?2200 06/07/21 ?0610 06/08/21 ?1218 06/09/21 ?1832  ?AST 18 20 18 17   ?ALT 11 13 12 10   ?ALKPHOS 58 58 59 57  ?BILITOT 0.7 0.8 0.7 0.6  ?PROT 8.0 8.4* 8.1 7.6  ?ALBUMIN 4.5 4.6 4.4 4.2  ? ?Recent Labs  ?Lab 06/05/21 ?2200 06/07/21 ?0610 06/09/21 ?1832  ?LIPASE 25 24 25   ? ?No results for input(s): AMMONIA in the last 168 hours. ?Coagulation Profile: ?No results for input(s): INR, PROTIME in the last 168 hours. ?Cardiac Enzymes: ?Recent Labs  ?Lab 06/08/21 ?1218  ?CKTOTAL 102  ? ?BNP (last 3 results) ?No results for input(s): PROBNP in the last 8760 hours. ?HbA1C: ?No results for input(s): HGBA1C in the last 72 hours. ?CBG: ?Recent Labs  ?Lab 06/09/21 ?1930  ?GLUCAP 86  ? ?Lipid Profile: ?No results for input(s): CHOL, HDL, LDLCALC, TRIG, CHOLHDL, LDLDIRECT in the last 72 hours. ?Thyroid Function Tests: ?No results for input(s): TSH, T4TOTAL, FREET4, T3FREE, THYROIDAB in the last 72 hours. ?Anemia Panel: ?No results for input(s): VITAMINB12, FOLATE, FERRITIN, TIBC, IRON, RETICCTPCT in the last 72 hours. ?Sepsis Labs: ?No results for input(s): PROCALCITON, LATICACIDVEN in the last 168 hours. ? ?Recent Results (from the past 240 hour(s))  ?Resp Panel by RT-PCR (Flu A&B, Covid) Nasopharyngeal Swab     Status: None  ? Collection Time: 06/02/21  7:18 PM  ? Specimen: Nasopharyngeal Swab; Nasopharyngeal(NP) swabs in vial transport medium  ?Result Value Ref Range Status  ? SARS Coronavirus 2 by RT PCR NEGATIVE NEGATIVE Final  ?  Comment: (NOTE) ?SARS-CoV-2 target nucleic acids are NOT DETECTED. ? ?The SARS-CoV-2 RNA is generally detectable in upper respiratory ?specimens during the acute phase of infection. The lowest ?concentration of SARS-CoV-2 viral copies this assay can detect is ?138 copies/mL. A negative result does not preclude SARS-Cov-2 ?infection and should not be used as the sole basis for treatment or ?other patient management decisions. A negative result  may occur with  ?improper specimen collection/handling, submission of specimen other ?than nasopharyngeal swab, presence of viral mutation(s) within the ?areas targeted by this assay, and inadequate number of viral ?copies(<138 copies/mL). A negative result must be combined with ?clinical observations, patient history, and epidemiological ?information. The expected result is Negative. ? ?Fact Sheet for Patients:  ?1219 ? ?Fact Sheet for Healthcare Providers:  ?08/09/21 ? ?This test is no t yet approved or cleared by the 08/02/21 FDA and  ?has been authorized for detection and/or diagnosis of SARS-CoV-2 by ?FDA under an Emergency Use Authorization (EUA). This EUA will remain  ?in effect (meaning this test can be used) for the duration of the ?COVID-19 declaration under Section 564(b)(1) of the Act, 21 ?U.S.C.section 360bbb-3(b)(1), unless the authorization is terminated  ?or revoked sooner.  ? ? ?  ?  Influenza A by PCR NEGATIVE NEGATIVE Final  ? Influenza B by PCR NEGATIVE NEGATIVE Final  ?  Comment: (NOTE) ?The Xpert Xpress SARS-CoV-2/FLU/RSV plus assay is intended as an aid ?in the diagnosis of influenza from Nasopharyngeal swab specimens and ?should not be used as a sole basis for treatment. Nasal washings and ?aspirates are unacceptable for Xpert Xpress SARS-CoV-2/FLU/RSV ?testing. ? ?Fact Sheet for Patients: ?BloggerCourse.comhttps://www.fda.gov/media/152166/download ? ?Fact Sheet for Healthcare Providers: ?SeriousBroker.ithttps://www.fda.gov/media/152162/download ? ?This test is not yet approved or cleared by the [place] FDA and ?has been authorized for detection and/or diagnosis of SARS-CoV-2 by ?FDA under an Emergency Use Authorization (EUA). This EUA will remain ?in effect (meaning this test can be used) for the duration of the ?COVID-19 declaration under Section 564(b)(1) of the Act, 21 U.S.C. ?section 360bbb-3(b)(1), unless the authorization is terminated  or ?revoked. ? ?Performed at [facility], [address] ?[place], [place] [address] ?  ?Resp Panel by RT-PCR (Flu A&B, Covid) Nasopharyngeal Swab     Status: None  ? Collection Time: 06/07/21  9:3

## 2021-06-12 NOTE — Progress Notes (Addendum)
Pt's PO intake continues to be poor d/t persitent nausea. She has had <280ml of PO intake (one cranberry juice per pt report). Encouraged pt to increase PO intake, antiemetic given per MAR. Pt continues to decline dietician ordered supplements.  ?

## 2021-06-13 ENCOUNTER — Telehealth (HOSPITAL_BASED_OUTPATIENT_CLINIC_OR_DEPARTMENT_OTHER): Payer: No Typology Code available for payment source | Admitting: Family Medicine

## 2021-06-13 ENCOUNTER — Ambulatory Visit: Payer: No Typology Code available for payment source | Admitting: Adult Health

## 2021-06-13 DIAGNOSIS — R112 Nausea with vomiting, unspecified: Secondary | ICD-10-CM | POA: Diagnosis not present

## 2021-06-13 LAB — COMPREHENSIVE METABOLIC PANEL
ALT: 13 U/L (ref 0–44)
AST: 23 U/L (ref 15–41)
Albumin: 4.3 g/dL (ref 3.5–5.0)
Alkaline Phosphatase: 51 U/L (ref 38–126)
Anion gap: 8 (ref 5–15)
BUN: <5 mg/dL — ABNORMAL LOW (ref 6–20)
CO2: 26 mmol/L (ref 22–32)
Calcium: 9.9 mg/dL (ref 8.9–10.3)
Chloride: 105 mmol/L (ref 98–111)
Creatinine, Ser: 0.94 mg/dL (ref 0.44–1.00)
GFR, Estimated: >60 mL/min (ref >60)
Glucose, Bld: 101 mg/dL — ABNORMAL HIGH (ref 70–99)
Potassium: 3.7 mmol/L (ref 3.5–5.1)
Sodium: 139 mmol/L (ref 135–145)
Total Bilirubin: 1 mg/dL (ref 0.3–1.2)
Total Protein: 7.7 g/dL (ref 6.5–8.1)

## 2021-06-13 LAB — CBC
HCT: 41.2 % (ref 36.0–46.0)
Hemoglobin: 13.2 g/dL (ref 12.0–15.0)
MCH: 28.6 pg (ref 26.0–34.0)
MCHC: 32 g/dL (ref 30.0–36.0)
MCV: 89.2 fL (ref 80.0–100.0)
Platelets: 257 10*3/uL (ref 150–400)
RBC: 4.62 MIL/uL (ref 3.87–5.11)
RDW: 12.8 % (ref 11.5–15.5)
WBC: 5.3 10*3/uL (ref 4.0–10.5)
nRBC: 0 % (ref 0.0–0.2)

## 2021-06-13 NOTE — Consult Note (Signed)
[facility] Consultation Note ? ?Referring Provider: [facility] ?Primary Care Physician:  de [place], Blondell Reveal, MD ? ?Reason for Consultation:  nausea, vomiting ? ?HPI: Kristina Blair is a 26 y.o. female admitted again for nausea/vomiting.  We saw last month for same symptoms.  Felt N/V multifactorial (infection vs cannabis hyperemesis), but symptoms have persisted and despite no marijuana in over one week.  She has associated epigastric discomfort.  Has lost 10-15 lbs over the past few weeks.  Mild constipation.  CT last week and gastric emptying studies negative.  No blood in stool.  Endoscopy about 4 years ago in [place] done for hiccups, unrevealing per patient.  Has trouble tolerating any kind of oral intake, despite antiemetics and supportive therapies. ? ? ?Past Medical History:  ?Diagnosis Date  ? Allergy   ? Anxiety   ? Phreesia 02/13/2020  ? ASCUS of cervix with negative high risk HPV   ? Asthma   ? Phreesia 02/13/2020  ? Depression   ? [employer]reesia 02/13/2020  ? GERD (gastroesophageal reflux disease)   ? Phreesia 02/13/2020  ? IUD (intrauterine device) in place 09/2019  ? liletta  ? Paroxysmal SVT (supraventricular tachycardia) (HCC)   ? ? ?Past Surgical History:  ?Procedure Laterality Date  ? WISDOM TOOTH EXTRACTION    ? ? ?Prior to Admission medications   ?Medication Sig Start Date End Date Taking? Authorizing Provider  ?ASHWAGANDHA PO Take 1 capsule by mouth daily with breakfast.   Yes [provider]  ?hydrOXYzine (ATARAX) 25 MG tablet Take 1 tablet (25 mg total) by mouth every 6 (six) hours as needed for anxiety. 06/06/21  Yes Quintella Reichert, MD  ?MAGNESIUM PO Take 1 tablet by mouth daily as needed (for leg cramps).   Yes [provider]  ?Multiple Vitamins-Calcium (ONE-A-DAY WOMENS FORMULA) TABS Take 1 tablet by mouth daily with breakfast.   Yes [provider]  ?ondansetron (ZOFRAN-ODT) 4 MG disintegrating tablet Take 1 tablet (4 mg total) by mouth every 8  (eight) hours as needed for nausea or vomiting. ?Patient taking differently: Take 4 mg by mouth every 8 (eight) hours as needed for nausea or vomiting (DISSOLVE ORALLY). 06/08/21  Yes Truddie Hidden, MD  ?pantoprazole (PROTONIX) 40 MG tablet Take 1 tablet (40 mg total) by mouth daily. ?Patient taking differently: Take 40 mg by mouth daily before breakfast. 06/05/21 06/05/22 Yes Shawna Clamp, MD  ?propranolol (INDERAL) 10 MG tablet Take 0.5 tablets (5 mg total) by mouth 3 (three) times daily as needed. ?Patient taking differently: Take 5 mg by mouth 3 (three) times daily as needed (elevated heart rate or palpitations). 06/01/21  Yes Loel Dubonnet, NP  ?potassium chloride SA (KLOR-CON M) 20 MEQ tablet Take 1 tablet (20 mEq total) by mouth daily. ?Patient not taking: Reported on 06/10/2021 05/30/21   Petrucelli, Aldona Bar R, PA-C  ?prochlorperazine (COMPAZINE) 25 MG suppository Place 1 suppository (25 mg total) rectally every 12 (twelve) hours as needed for up to 5 days for nausea or vomiting. ?Patient not taking: Reported on 06/10/2021 06/08/21 06/13/21  Janeece Fitting, PA-C  ?promethazine (PHENERGAN) 25 MG tablet Take 1 tablet (25 mg total) by mouth every 6 (six) hours as needed for nausea or vomiting. ?Patient not taking: Reported on 06/10/2021 05/10/21   Carlisle Cater, PA-C  ? ? ?Current Facility-Administered Medications  ?Medication Dose Route Frequency Provider Last Rate Last Admin  ? (feeding supplement) PROSource Plus liquid 30 mL  30 mL Oral TID BM Little Ishikawa, MD      ?  enoxaparin (LOVENOX) injection 40 mg  40 mg Subcutaneous Q24H Tu, Ching T, DO   40 mg at 06/12/21 1053  ? feeding supplement (BOOST / RESOURCE BREEZE) liquid 1 Container  1 Container Oral TID BM Lancaster, Kirt Chew C, MD   Given at 06/11/21 2034  ? hydrOXYzine (ATARAX) tablet 25 mg  25 mg Oral TID PRN Tu, Ching T, DO   25 mg at 06/12/21 2204  ? lactated ringers infusion   Intravenous Continuous Lancaster, Coraline Talwar C, MD 50 mL/hr at 06/13/21 0334  New Bag at 06/13/21 0334  ? MEDLINE mouth rinse  15 mL Mouth Rinse BID Lancaster, Mikell Kazlauskas C, MD   15 mL at 06/12/21 2133  ? ondansetron (ZOFRAN) injection 4 mg  4 mg Intravenous Q6H PRN Tu, Ching T, DO   4 mg at 06/13/21 0538  ? pantoprazole (PROTONIX) EC tablet 40 mg  40 mg Oral Daily Lancaster, Denzil Bristol C, MD   40 mg at 06/12/21 1548  ? ? ?Allergies as of 06/09/2021 - Review Complete 06/08/2021  ?Allergen Reaction Noted  ? Kiwi extract Other (See Comments) 12/05/2020  ? Reglan [metoclopramide] Anxiety 06/07/2021  ? ? ?Family History  ?Problem Relation Age of Onset  ? Diabetes Mother   ? Hypertension Mother   ? Breast cancer Mother 32  ?     recurrence age 48  ? Early death Mother   ? Hyperlipidemia Mother   ? Miscarriages / Stillbirths Mother   ? Obesity Mother   ? Bipolar disorder Father   ? Cerebral palsy Brother   ? Epilepsy Brother   ? Diabetes Maternal Grandmother   ? Heart disease Maternal Grandmother   ? Hyperlipidemia Maternal Grandmother   ? Hypertension Maternal Grandmother   ? Cancer Maternal Grandfather   ?     leukemia  ? Cancer Maternal Aunt 28  ?     uterine cancer, passed age 32  ? Ovarian cancer Maternal Great-grandmother   ?     passed aroung age 62  ? ? ?Social History  ? ?Socioeconomic History  ? Marital status: Single  ?  Spouse name: Not on file  ? Number of children: Not on file  ? Years of education: Not on file  ? Highest education level: Not on file  ?Occupational History  ? Not on file  ?Tobacco Use  ? Smoking status: Never  ? Smokeless tobacco: Never  ?Vaping Use  ? Vaping Use: Never used  ?Substance and Sexual Activity  ? Alcohol use: Not Currently  ? Drug use: Never  ? Sexual activity: Never  ?Other Topics Concern  ? Not on file  ?Social History Narrative  ? Not on file  ? ?Social Determinants of Health  ? ?Financial Resource Strain: Not on file  ?Food Insecurity: Not on file  ?Transportation Needs: Not on file  ?Physical Activity: Not on file  ?Stress: Not on file  ?Social  Connections: Not on file  ?Intimate Partner Violence: Not on file  ? ? ?Review of Systems: As per HPI, all others negative ? ?Physical Exam: ?Vital signs in last 24 hours: ?Temp:  [98 ?F (36.7 ?C)-98.7 ?F (37.1 ?C)] 98.4 ?F (36.9 ?C) (04/12 0739) ?Pulse Rate:  [66-75] 75 (04/12 0739) ?Resp:  [18] 18 (04/12 0442) ?BP: (101-110)/(56-74) 104/71 (04/12 0739) ?SpO2:  [100 %] 100 % (04/12 0739) ?Last BM Date : 06/12/21 ?General:   Alert,  thin, pleasant and cooperative in NAD ?Head:  Normocephalic and atraumatic. ?Eyes:  Sclera clear, no icterus.     Conjunctiva pink. ?Ears:  Normal auditory acuity. ?Nose:  No deformity, discharge,  or lesions. ?Mouth:  No deformity or lesions.  Oropharynx pink & moist. ?Neck:  Supple; no masses or thyromegaly. ?Abdomen:  Soft, mild epigastric tenderness, no peritonitis, and nondistended. No masses, hepatosplenomegaly or hernias noted. Normal bowel sounds, without guarding, and without rebound.     ?Msk:  Symmetrical without gross deformities. Normal posture. ?Pulses:  Normal pulses noted. ?Extremities:  Without clubbing or edema. ?Neurologic:  Alert and  oriented x4;  grossly normal neurologically. ?Skin:  Intact without significant lesions or rashes. ?Psych:  Alert and cooperative. Normal mood and affect. ? ? ?Lab Results: ?Recent Labs  ?  06/13/21 ?0609  ?WBC 5.3  ?HGB 13.2  ?HCT 41.2  ?PLT 257  ? ?BMET ?Recent Labs  ?  06/13/21 ?0609  ?NA 139  ?K 3.7  ?CL 105  ?CO2 26  ?GLUCOSE 101*  ?BUN <5*  ?CREATININE 0.94  ?CALCIUM 9.9  ? ?LFT ?Recent Labs  ?  06/13/21 ?0609  ?PROT 7.7  ?ALBUMIN 4.3  ?AST 23  ?ALT 13  ?ALKPHOS 51  ?BILITOT 1.0  ? ?PT/INR ?No results for input(s): LABPROT, INR in the last 72 hours. ? ?Studies/Results: ?NM GASTRIC EMPTYING ? ?Result Date: 06/11/2021 ?CLINICAL DATA:  Intractable nausea and vomiting. EXAM: NUCLEAR MEDICINE GASTRIC EMPTYING SCAN TECHNIQUE: After oral ingestion of radiolabeled meal, sequential abdominal images were obtained for 4 hours. Percentage of  activity emptying the stomach was calculated at 1 hour, 2 hour, 3 hour, and 4 hours. RADIOPHARMACEUTICALS:  2.0 mCi Tc-11m sulfur colloid in standardized meal COMPARISON:  None. FINDINGS: Expected location of the stom

## 2021-06-13 NOTE — H&P (View-Only) (Signed)
[facility] Consultation Note ? ?Referring Provider: [facility] ?Primary Care Physician:  de [place], Blondell Reveal, MD ? ?Reason for Consultation:  nausea, vomiting ? ?HPI: Kristina Blair is a 26 y.o. female admitted again for nausea/vomiting.  We saw last month for same symptoms.  Felt N/V multifactorial (infection vs cannabis hyperemesis), but symptoms have persisted and despite no marijuana in over one week.  She has associated epigastric discomfort.  Has lost 10-15 lbs over the past few weeks.  Mild constipation.  CT last week and gastric emptying studies negative.  No blood in stool.  Endoscopy about 4 years ago in [place] done for hiccups, unrevealing per patient.  Has trouble tolerating any kind of oral intake, despite antiemetics and supportive therapies. ? ? ?Past Medical History:  ?Diagnosis Date  ? Allergy   ? Anxiety   ? Phreesia 02/13/2020  ? ASCUS of cervix with negative high risk HPV   ? Asthma   ? Phreesia 02/13/2020  ? Depression   ? Phreesia 02/13/2020  ? GERD (gastroesophageal reflux disease)   ? Phreesia 02/13/2020  ? IUD (intrauterine device) in place 09/2019  ? liletta  ? Paroxysmal SVT (supraventricular tachycardia) (HCC)   ? ? ?Past Surgical History:  ?Procedure Laterality Date  ? WISDOM TOOTH EXTRACTION    ? ? ?Prior to Admission medications   ?Medication Sig Start Date End Date Taking? Authorizing Provider  ?ASHWAGANDHA PO Take 1 capsule by mouth daily with breakfast.   Yes [provider]  ?hydrOXYzine (ATARAX) 25 MG tablet Take 1 tablet (25 mg total) by mouth every 6 (six) hours as needed for anxiety. 06/06/21  Yes Quintella Reichert, MD  ?MAGNESIUM PO Take 1 tablet by mouth daily as needed (for leg cramps).   Yes [provider]  ?Multiple Vitamins-Calcium (ONE-A-DAY WOMENS FORMULA) TABS Take 1 tablet by mouth daily with breakfast.   Yes [provider]  ?ondansetron (ZOFRAN-ODT) 4 MG disintegrating tablet Take 1 tablet (4 mg total) by mouth every 8  (eight) hours as needed for nausea or vomiting. ?Patient taking differently: Take 4 mg by mouth every 8 (eight) hours as needed for nausea or vomiting (DISSOLVE ORALLY). 06/08/21  Yes Truddie Hidden, MD  ?pantoprazole (PROTONIX) 40 MG tablet Take 1 tablet (40 mg total) by mouth daily. ?Patient taking differently: Take 40 mg by mouth daily before breakfast. 06/05/21 06/05/22 Yes Shawna Clamp, MD  ?propranolol (INDERAL) 10 MG tablet Take 0.5 tablets (5 mg total) by mouth 3 (three) times daily as needed. ?Patient taking differently: Take 5 mg by mouth 3 (three) times daily as needed (elevated heart rate or palpitations). 06/01/21  Yes Loel Dubonnet, NP  ?potassium chloride SA (KLOR-CON M) 20 MEQ tablet Take 1 tablet (20 mEq total) by mouth daily. ?Patient not taking: Reported on 06/10/2021 05/30/21   Petrucelli, Aldona Bar R, PA-C  ?prochlorperazine (COMPAZINE) 25 MG suppository Place 1 suppository (25 mg total) rectally every 12 (twelve) hours as needed for up to 5 days for nausea or vomiting. ?Patient not taking: Reported on 06/10/2021 06/08/21 06/13/21  Janeece Fitting, PA-C  ?promethazine (PHENERGAN) 25 MG tablet Take 1 tablet (25 mg total) by mouth every 6 (six) hours as needed for nausea or vomiting. ?Patient not taking: Reported on 06/10/2021 05/10/21   Carlisle Cater, PA-C  ? ? ?Current Facility-Administered Medications  ?Medication Dose Route Frequency Provider Last Rate Last Admin  ? (feeding supplement) PROSource Plus liquid 30 mL  30 mL Oral TID BM Little Ishikawa, MD      ?  enoxaparin (LOVENOX) injection 40 mg  40 mg Subcutaneous Q24H Tu, Ching T, DO   40 mg at 06/12/21 1053  ? feeding supplement (BOOST / RESOURCE BREEZE) liquid 1 Container  1 Container Oral TID BM Azucena Fallen, MD   Given at 06/11/21 2034  ? hydrOXYzine (ATARAX) tablet 25 mg  25 mg Oral TID PRN Benita Gutter T, DO   25 mg at 06/12/21 2204  ? lactated ringers infusion   Intravenous Continuous Azucena Fallen, MD 50 mL/hr at 06/13/21 0334  New Bag at 06/13/21 0334  ? MEDLINE mouth rinse  15 mL Mouth Rinse BID Azucena Fallen, MD   15 mL at 06/12/21 2133  ? ondansetron (ZOFRAN) injection 4 mg  4 mg Intravenous Q6H PRN Tu, Ching T, DO   4 mg at 06/13/21 0538  ? pantoprazole (PROTONIX) EC tablet 40 mg  40 mg Oral Daily Azucena Fallen, MD   40 mg at 06/12/21 1548  ? ? ?Allergies as of 06/09/2021 - Review Complete 06/08/2021  ?Allergen Reaction Noted  ? Kiwi extract Other (See Comments) 12/05/2020  ? Reglan [metoclopramide] Anxiety 06/07/2021  ? ? ?Family History  ?Problem Relation Age of Onset  ? Diabetes Mother   ? Hypertension Mother   ? Breast cancer Mother 75  ?     recurrence age 76  ? Early death Mother   ? Hyperlipidemia Mother   ? Miscarriages / [place] Mother   ? Obesity Mother   ? Bipolar disorder Father   ? Cerebral palsy Brother   ? Epilepsy Brother   ? Diabetes Maternal Grandmother   ? Heart disease Maternal Grandmother   ? Hyperlipidemia Maternal Grandmother   ? Hypertension Maternal Grandmother   ? Cancer Maternal Grandfather   ?     leukemia  ? Cancer Maternal Aunt 28  ?     uterine cancer, passed age 71  ? Ovarian cancer Maternal Great-grandmother   ?     passed aroung age 64  ? ? ?Social History  ? ?Socioeconomic History  ? Marital status: Single  ?  Spouse name: Not on file  ? Number of children: Not on file  ? Years of education: Not on file  ? Highest education level: Not on file  ?Occupational History  ? Not on file  ?Tobacco Use  ? Smoking status: Never  ? Smokeless tobacco: Never  ?Vaping Use  ? Vaping Use: Never used  ?Substance and Sexual Activity  ? Alcohol use: Not Currently  ? Drug use: Never  ? Sexual activity: Never  ?Other Topics Concern  ? Not on file  ?Social History Narrative  ? Not on file  ? ?Social Determinants of Health  ? ?Financial Resource Strain: Not on file  ?Food Insecurity: Not on file  ?Transportation Needs: Not on file  ?Physical Activity: Not on file  ?Stress: Not on file  ?Social  Connections: Not on file  ?Intimate Partner Violence: Not on file  ? ? ?Review of Systems: As per HPI, all others negative ? ?Physical Exam: ?Vital signs in last 24 hours: ?Temp:  [98 ?F (36.7 ?C)-98.7 ?F (37.1 ?C)] 98.4 ?F (36.9 ?C) (04/12 0739) ?Pulse Rate:  [66-75] 75 (04/12 0739) ?Resp:  [18] 18 (04/12 0442) ?BP: (101-110)/(56-74) 104/71 (04/12 0739) ?SpO2:  [100 %] 100 % (04/12 0739) ?Last BM Date : 06/12/21 ?General:   Alert,  thin, pleasant and cooperative in NAD ?Head:  Normocephalic and atraumatic. ?Eyes:  Sclera clear, no icterus.  Conjunctiva pink. ?Ears:  Normal auditory acuity. ?Nose:  No deformity, discharge,  or lesions. ?Mouth:  No deformity or lesions.  Oropharynx pink & moist. ?Neck:  Supple; no masses or thyromegaly. ?Abdomen:  Soft, mild epigastric tenderness, no peritonitis, and nondistended. No masses, hepatosplenomegaly or hernias noted. Normal bowel sounds, without guarding, and without rebound.     ?Msk:  Symmetrical without gross deformities. Normal posture. ?Pulses:  Normal pulses noted. ?Extremities:  Without clubbing or edema. ?Neurologic:  Alert and  oriented x4;  grossly normal neurologically. ?Skin:  Intact without significant lesions or rashes. ?Psych:  Alert and cooperative. Normal mood and affect. ? ? ?Lab Results: ?Recent Labs  ?  06/13/21 ?0609  ?WBC 5.3  ?HGB 13.2  ?HCT 41.2  ?PLT 257  ? ?BMET ?Recent Labs  ?  06/13/21 ?0609  ?NA 139  ?K 3.7  ?CL 105  ?CO2 26  ?GLUCOSE 101*  ?BUN <5*  ?CREATININE 0.94  ?CALCIUM 9.9  ? ?LFT ?Recent Labs  ?  06/13/21 ?0609  ?PROT 7.7  ?ALBUMIN 4.3  ?AST 23  ?ALT 13  ?ALKPHOS 51  ?BILITOT 1.0  ? ?PT/INR ?No results for input(s): LABPROT, INR in the last 72 hours. ? ?Studies/Results: ?NM GASTRIC EMPTYING ? ?Result Date: 06/11/2021 ?CLINICAL DATA:  Intractable nausea and vomiting. EXAM: NUCLEAR MEDICINE GASTRIC EMPTYING SCAN TECHNIQUE: After oral ingestion of radiolabeled meal, sequential abdominal images were obtained for 4 hours. Percentage of  activity emptying the stomach was calculated at 1 hour, 2 hour, 3 hour, and 4 hours. RADIOPHARMACEUTICALS:  2.0 mCi Tc-11m sulfur colloid in standardized meal COMPARISON:  None. FINDINGS: Expected location of the stom

## 2021-06-13 NOTE — Progress Notes (Signed)
?PROGRESS NOTE ? ?Kristina Blair  [id number] DOB: 10/16/1995 DOA: 06/09/2021 ?PCP: de [place], Raymond J, MD  ? ?Brief Narrative: ?Patient is a 26 year old female with history of SVT, ADHD, anxiety, depression who presented here with intractable nausea and vomiting.  She was just discharged 4 days ago after managed for the same.  She came back with persistent nausea and vomiting.  GI has been consulted.  Plan for EGD tomorrow ? ? ?Assessment & Plan: ? ?Principal Problem: ?  Intractable nausea and vomiting ?Active Problems: ?  Anxiety and depression ?  Hypokalemia ?  Malnutrition of moderate degree ? ?Intractable nausea/vomiting: Was admitted few days ago for the same, discharged to home but came back with similar symptoms.  GI was consulted on earlier admission also.  There was concern for hyperemesis cannabinoid syndrome versus gastroparesis.  Gastric emptying study done on 4/10 was within normal limit.  Denies frequent use of cannabis. ?Continue supportive care with IV fluids, antiemetics.  She wants to advance diet to soft today. ?GI following here, plan for EGD tomorrow. ? ?Hypokalemia: Continue to monitor and supplement as needed ? ?Anxiety/depression: On hydroxyzine.  Patient is planning for outpatient follow-up with psychiatry. ? ? ?  ? ? ?Nutrition Problem: Moderate Malnutrition (in the context of acute illness) ?Etiology: vomiting, nausea ?  ? ?DVT prophylaxis:enoxaparin (LOVENOX) injection 40 mg Start: 06/10/21 1000 ?SCDs Start: 06/10/21 0006 ? ? ?  Code Status: Full Code ? ?Family Communication: None at bedside ? ?Patient status:Inpatient ? ?Patient is from :Home ? ?Anticipated discharge [id number] ? ?Estimated DC date:tomorrow ? ? ?Consultants: GI ? ?Procedures: Plan for EGD ? ?Antimicrobials:  ?Anti-infectives (From admission, onward)  ? ? None  ? ?  ? ? ?Subjective: ? ?Patient seen and examined at bedside this morning.  During my evaluation this morning, she looked comfortable without any worsening nausea or  vomiting.  She actually wanted to advance the diet to soft.  Abdomen was benign on examination ? ?Objective: ?Vitals:  ? 06/12/21 1606 06/12/21 2030 06/13/21 0442 06/13/21 0739  ?BP: (!) 101/56 110/68 109/74 104/71  ?Pulse: 75 66 74 75  ?Resp: 18 18 18    ?Temp: 98 ?F (36.7 ?C) 98.2 ?F (36.8 ?C) 98.7 ?F (37.1 ?C) 98.4 ?F (36.9 ?C)  ?TempSrc: Oral Oral Oral Oral  ?SpO2: 100% 100% 100% 100%  ?Weight:      ?Height:      ? ? ?Intake/Output Summary (Last 24 hours) at 06/13/2021 1115 ?Last data filed at 06/13/2021 0000 ?Gross per 24 hour  ?Intake 406.8 ml  ?Output --  ?Net 406.8 ml  ? ?Filed Weights  ? 06/10/21 0159  ?Weight: 54.9 kg  ? ? ?Examination: ? ?General exam: Overall comfortable, not in distress ?HEENT: PERRL ?Respiratory system:  no wheezes or crackles  ?Cardiovascular system: S1 & S2 heard, RRR.  ?Gastrointestinal system: Abdomen is nondistended, soft and nontender. ?Central nervous system: Alert and oriented ?Extremities: No edema, no clubbing ,no cyanosis ?Skin: No rashes, no ulcers,no icterus   ? ? ?Data Reviewed: I have personally reviewed following labs and imaging studies ? ?CBC: ?Recent Labs  ?Lab 06/07/21 ?0610 06/08/21 ?1218 06/09/21 ?1832 06/13/21 ?[id number]  ?WBC 11.0* 10.5 6.3 5.3  ?NEUTROABS 6.6 5.7  --   --   ?HGB 14.1 13.5 13.6 13.2  ?HCT 42.7 40.8 42.4 41.2  ?MCV 87.5 88.1 87.4 89.2  ?PLT 366 309 332 257  ? ?Basic Metabolic Panel: ?Recent Labs  ?Lab 06/07/21 ?0610 06/08/21 ?1218 06/09/21 ?1832 06/10/21 ?0305 06/13/21 ?[id number]  ?  NA 138 138 137 140 139  ?K 3.7 3.5 3.4* 3.9 3.7  ?CL 105 104 106 112* 105  ?CO2 24 24 20* 24 26  ?GLUCOSE 84 84 113* 96 101*  ?BUN 5* 7 <5* <5* <5*  ?CREATININE 0.74 0.78 0.85 0.86 0.94  ?CALCIUM 10.2 9.5 9.7 8.7* 9.9  ?MG  --   --   --  2.0  --   ? ? ? ?Recent Results (from the past 240 hour(s))  ?Resp Panel by RT-PCR (Flu A&B, Covid) Nasopharyngeal Swab     Status: None  ? Collection Time: 06/07/21  9:31 PM  ? Specimen: Nasopharyngeal Swab; Nasopharyngeal(NP) swabs in vial  transport medium  ?Result Value Ref Range Status  ? SARS Coronavirus 2 by RT PCR NEGATIVE NEGATIVE Final  ?  Comment: (NOTE) ?SARS-CoV-2 target nucleic acids are NOT DETECTED. ? ?The SARS-CoV-2 RNA is generally detectable in upper respiratory ?specimens during the acute phase of infection. The lowest ?concentration of SARS-CoV-2 viral copies this assay can detect is ?138 copies/mL. A negative result does not preclude SARS-Cov-2 ?infection and should not be used as the sole basis for treatment or ?other patient management decisions. A negative result may occur with  ?improper specimen collection/handling, submission of specimen other ?than nasopharyngeal swab, presence of viral mutation(s) within the ?areas targeted by this assay, and inadequate number of viral ?copies(<138 copies/mL). A negative result must be combined with ?clinical observations, patient history, and epidemiological ?information. The expected result is Negative. ? ?Fact Sheet for Patients:  ?BloggerCourse.com ? ?Fact Sheet for Healthcare Providers:  ?SeriousBroker.it ? ?This test is no t yet approved or cleared by the [place] FDA and  ?has been authorized for detection and/or diagnosis of SARS-CoV-2 by ?FDA under an Emergency Use Authorization (EUA). This EUA will remain  ?in effect (meaning this test can be used) for the duration of the ?COVID-19 declaration under Section 564(b)(1) of the Act, 21 ?U.S.C.section 360bbb-3(b)(1), unless the authorization is terminated  ?or revoked sooner.  ? ? ?  ? Influenza A by PCR NEGATIVE NEGATIVE Final  ? Influenza B by PCR NEGATIVE NEGATIVE Final  ?  Comment: (NOTE) ?The Xpert Xpress SARS-CoV-2/FLU/RSV plus assay is intended as an aid ?in the diagnosis of influenza from Nasopharyngeal swab specimens and ?should not be used as a sole basis for treatment. Nasal washings and ?aspirates are unacceptable for Xpert Xpress SARS-CoV-2/FLU/RSV ?testing. ? ?Fact  Sheet for Patients: ?BloggerCourse.com ? ?Fact Sheet for Healthcare Providers: ?SeriousBroker.it ? ?This test is not yet approved or cleared by the [place] FDA and ?has been authorized for detection and/or diagnosis of SARS-CoV-2 by ?FDA under an Emergency Use Authorization (EUA). This EUA will remain ?in effect (meaning this test can be used) for the duration of the ?COVID-19 declaration under Section 564(b)(1) of the Act, 21 U.S.C. ?section 360bbb-3(b)(1), unless the authorization is terminated or ?revoked. ? ?Performed at [facility], [address] ?[place], [place] [address] ?  ?  ? ?Radiology Studies: ?NM GASTRIC EMPTYING ? ?Result Date: 06/11/2021 ?CLINICAL DATA:  Intractable nausea and vomiting. EXAM: NUCLEAR MEDICINE GASTRIC EMPTYING SCAN TECHNIQUE: After oral ingestion of radiolabeled meal, sequential abdominal images were obtained for 4 hours. Percentage of activity emptying the stomach was calculated at 1 hour, 2 hour, 3 hour, and 4 hours. RADIOPHARMACEUTICALS:  2.0 mCi Tc-42m sulfur colloid in standardized meal COMPARISON:  None. FINDINGS: Expected location of the stomach in the left upper quadrant. Ingested meal empties the stomach gradually over the course of  the study. 51% emptied at 1 hr ( normal >= 10%) 70% emptied at 2 hr ( normal >= 40%) 88% emptied at 3 hr ( normal >= 70%) 92% emptied at 4 hr ( normal >= 90%) IMPRESSION: Normal gastric emptying study. Electronically Signed   By: Aletta Edouard M.D.   On: 06/11/2021 16:23   ? ?Scheduled Meds: ? (feeding supplement) PROSource Plus  30 mL Oral TID BM  ? enoxaparin (LOVENOX) injection  40 mg Subcutaneous Q24H  ? feeding supplement  1 Container Oral TID BM  ? mouth rinse  15 mL Mouth Rinse BID  ? pantoprazole  40 mg Oral Daily  ? ?Continuous Infusions: ? lactated ringers 50 mL/hr at 06/13/21 0334  ? ? ? LOS: 3 days  ? ?Shelly Coss, MD ?[facility] ?P4/02/2022,  11:15 AM   ?

## 2021-06-13 NOTE — Progress Notes (Signed)
Per pt report, she tried to drink a Boost early this morning but immediately started to throw up. Denies any more occurrances of emesis. Reports persistent nausea; breakfast ordered.  ?

## 2021-06-13 NOTE — Telephone Encounter (Signed)
[employer]. Called regarding the appt on 01/17/21. Stated that we need to resend claim because they have the provider as a specialist and for it to be fixed it needs to be resubmitted to them. Looking at inquiry I read off to Ins that the services that were done and the paid amount that ins covered matched the remanding balance is correct. ?Please advise. ?

## 2021-06-14 ENCOUNTER — Encounter (HOSPITAL_COMMUNITY): Payer: Self-pay | Admitting: Internal Medicine

## 2021-06-14 ENCOUNTER — Inpatient Hospital Stay (HOSPITAL_COMMUNITY): Payer: No Typology Code available for payment source | Admitting: Anesthesiology

## 2021-06-14 ENCOUNTER — Encounter (HOSPITAL_COMMUNITY)
Admission: EM | Disposition: A | Discharge status: Home / Self Care | Payer: Self-pay | Source: Home / Self Care | Attending: Internal Medicine

## 2021-06-14 DIAGNOSIS — R112 Nausea with vomiting, unspecified: Secondary | ICD-10-CM

## 2021-06-14 DIAGNOSIS — R1013 Epigastric pain: Secondary | ICD-10-CM

## 2021-06-14 DIAGNOSIS — F418 Other specified anxiety disorders: Secondary | ICD-10-CM

## 2021-06-14 HISTORY — PX: ESOPHAGOGASTRODUODENOSCOPY (EGD) WITH PROPOFOL: SHX5813

## 2021-06-14 LAB — COMPREHENSIVE METABOLIC PANEL
ALT: 12 U/L (ref 0–44)
AST: 19 U/L (ref 15–41)
Albumin: 3.8 g/dL (ref 3.5–5.0)
Alkaline Phosphatase: 45 U/L (ref 38–126)
Anion gap: 6 (ref 5–15)
BUN: <5 mg/dL — ABNORMAL LOW (ref 6–20)
CO2: 22 mmol/L (ref 22–32)
Calcium: 9.3 mg/dL (ref 8.9–10.3)
Chloride: 110 mmol/L (ref 98–111)
Creatinine, Ser: 0.86 mg/dL (ref 0.44–1.00)
GFR, Estimated: >60 mL/min (ref >60)
Glucose, Bld: 76 mg/dL (ref 70–99)
Potassium: 3.7 mmol/L (ref 3.5–5.1)
Sodium: 138 mmol/L (ref 135–145)
Total Bilirubin: 0.5 mg/dL (ref 0.3–1.2)
Total Protein: 6.6 g/dL (ref 6.5–8.1)

## 2021-06-14 SURGERY — ESOPHAGOGASTRODUODENOSCOPY (EGD) WITH PROPOFOL
Anesthesia: Monitor Anesthesia Care

## 2021-06-14 MED ORDER — SUCRALFATE 1 GM/10ML PO SUSP
1.0000 g | Freq: Three times a day (TID) | ORAL | Status: DC
  Administered 2021-06-14: 1 g via ORAL
  Filled 2021-06-14: qty 10

## 2021-06-14 MED ORDER — PROPOFOL 500 MG/50ML IV EMUL
INTRAVENOUS | Status: DC | PRN
  Administered 2021-06-14: 125 ug/kg/min via INTRAVENOUS

## 2021-06-14 MED ORDER — LIDOCAINE 2% (20 MG/ML) 5 ML SYRINGE
INTRAMUSCULAR | Status: DC | PRN
  Administered 2021-06-14: 100 mg via INTRAVENOUS

## 2021-06-14 MED ORDER — GLYCOPYRROLATE PF 0.2 MG/ML IJ SOSY
PREFILLED_SYRINGE | INTRAMUSCULAR | Status: DC | PRN
  Administered 2021-06-14: .1 mg via INTRAVENOUS

## 2021-06-14 MED ORDER — ONDANSETRON 4 MG PO TBDP: 4.0000 mg | ORAL_TABLET | Freq: Three times a day (TID) | ORAL | 0 refills | Status: DC | PRN

## 2021-06-14 MED ORDER — SODIUM CHLORIDE 0.9 % IV SOLN: INTRAVENOUS | Status: DC

## 2021-06-14 MED ORDER — PROPOFOL 10 MG/ML IV BOLUS
INTRAVENOUS | Status: DC | PRN
  Administered 2021-06-14: 30 mg via INTRAVENOUS
  Administered 2021-06-14: 20 mg via INTRAVENOUS
  Administered 2021-06-14: 50 mg via INTRAVENOUS
  Administered 2021-06-14 (×2): 20 mg via INTRAVENOUS

## 2021-06-14 MED ORDER — PROSOURCE PLUS PO LIQD: 30.0000 mL | Freq: Three times a day (TID) | ORAL | 1 refills | Status: DC

## 2021-06-14 SURGICAL SUPPLY — 15 items

## 2021-06-14 NOTE — Interval H&P Note (Signed)
History and Physical Interval Note: ? ?06/14/2021 ?9:37 AM ? ?Kristina Blair  has presented today for surgery, with the diagnosis of nausea, vomiting.  The various methods of treatment have been discussed with the patient and family. After consideration of risks, benefits and other options for treatment, the patient has consented to  Procedure(s): ?ESOPHAGOGASTRODUODENOSCOPY (EGD) WITH PROPOFOL (N/A) as a surgical intervention.  The patient's history has been reviewed, patient examined, no change in status, stable for surgery.  I have reviewed the patient's chart and labs.  Questions were answered to the patient's satisfaction.   ? ? ?Freddy Jaksch ? ? ?

## 2021-06-14 NOTE — Telephone Encounter (Signed)
Called insurance Ref # :[id number] ? ?Representative states claims are appropriately processed and patient has a balance for DOS 01/17/21 due to unsatisfied deductible. At this time deductible has been met. ? ?Representative confirmed that provider is not listed as a specialist on the claim. Credentialing dept confirmed provider is listed under Family Medicine for all [place] plans. ? ?Please advise patient. ?

## 2021-06-14 NOTE — Op Note (Signed)
[facility] ?Patient Name: Kristina Blair ?Procedure Date : 06/14/2021 ?MRN: [id number] ?Attending MD: Arta Silence , MD ?Date of Birth: [birth year] ?CSN: [id number] ?Age: 26 ?Admit Type: Inpatient ?Procedure:                Upper GI endoscopy ?Indications:              Epigastric abdominal pain, Nausea with vomiting ?Providers:                Arta Silence, MD, Jaci Carrel, RN, [place]  ?                          [place], Technician ?Referring MD:             [facility] ?Medicines:                Monitored Anesthesia Care ?Complications:            No immediate complications. ?Estimated Blood Loss:     Estimated blood loss: none. ?Procedure:                Pre-Anesthesia Assessment: ?                          - Prior to the procedure, a History and Physical  ?                          was performed, and patient medications and  ?                          allergies were reviewed. The patient's tolerance of  ?                          previous anesthesia was also reviewed. The risks  ?                          and benefits of the procedure and the sedation  ?                          options and risks were discussed with the patient.  ?                          All questions were answered, and informed consent  ?                          was obtained. Prior Anticoagulants: The patient has  ?                          taken no previous anticoagulant or antiplatelet  ?                          agents. ASA Grade Assessment: II - A patient with  ?                          mild systemic disease. After reviewing the risks  ?  and benefits, the patient was deemed in  ?                          satisfactory condition to undergo the procedure. ?                          After obtaining informed consent, the endoscope was  ?                          passed under direct vision. Throughout the  ?                          procedure, the patient's blood pressure, pulse, and  ?                           oxygen saturations were monitored continuously. The  ?                          [id number]) Olympus endoscope was introduced  ?                          through the mouth, and advanced to the second part  ?                          of duodenum. The upper GI endoscopy was  ?                          accomplished without difficulty. The patient  ?                          tolerated the procedure well. ?Scope In: ?Scope Out: ?Findings: ?     The examined esophagus was normal. ?     The entire examined stomach was normal. ?     The duodenal bulb, first portion of the duodenum and second portion of  ?     the duodenum were normal. ?Impression:               - Normal esophagus. ?                          - Normal stomach. ?                          - Normal duodenal bulb, first portion of the  ?                          duodenum and second portion of the duodenum. ?                          - No obvious cause nausea/vomiting identified. ?Recommendation:           - Return patient to hospital ward for ongoing care. ?                          - Continue present medications. Maximize  ?  antisecretory therapies. ?                          - Advance diet slowly as tolerated. ?                          - [facility] will follow. ?Procedure Code(s):        --- Professional --- ?                          [id number], Esophagogastroduodenoscopy, flexible,  ?                          transoral; diagnostic, including collection of  ?                          specimen(s) by brushing or washing, when performed  ?                          (separate procedure) ?Diagnosis Code(s):        --- Professional --- ?                          R10.13, Epigastric pain ?                          R11.2, Nausea with vomiting, unspecified ?CPT copyright 2019 American Medical Association. All rights reserved. ?The codes documented in this report are preliminary and upon coder review may  ?be revised to meet current  compliance requirements. ?Arta Silence, MD ?06/14/2021 10:14:06 AM ?This report has been signed electronically. ?Number of Addenda: 0 ?

## 2021-06-14 NOTE — Anesthesia Procedure Notes (Signed)
Procedure Name: [place] ?Date/Time: 06/14/2021 9:45 AM ?Performed by: Erick Colace, CRNA ?Pre-anesthesia Checklist: Patient identified, Emergency Drugs available, Suction available and Patient being monitored ?Patient Re-evaluated:Patient Re-evaluated prior to induction ?Oxygen Delivery Method: Nasal cannula ?Preoxygenation: Pre-oxygenation with 100% oxygen ?Induction Type: IV induction ?Dental Injury: Teeth and Oropharynx as per pre-operative assessment  ? ? ? ? ?

## 2021-06-14 NOTE — Plan of Care (Signed)
  Problem: Pain Managment: Goal: General experience of comfort will improve Outcome: Progressing   Problem: Safety: Goal: Ability to remain free from injury will improve Outcome: Progressing   Problem: Skin Integrity: Goal: Risk for impaired skin integrity will decrease Outcome: Progressing   

## 2021-06-14 NOTE — Telephone Encounter (Signed)
LVM for pt to advise that deductible has been met. Requested for pt to call back at earliest convenience. ?

## 2021-06-14 NOTE — Progress Notes (Signed)
Discharge instructions given to patient. Patient verbalized understanding of all teaching and had no further questions. Hospital note also given to patient at discharge.  ?

## 2021-06-14 NOTE — Progress Notes (Signed)
Pt returned to room 6N14 from endo. Received report from [place], [place]. Pt alert and oriented. VSS. ?

## 2021-06-14 NOTE — Discharge Summary (Signed)
Physician Discharge Summary  ?Kristina Blair HYQ:[id number] DOB: Nov 24, 1995 DOA: 06/09/2021 ? ?PCP: de [place], Raymond J, MD ? ?Admit date: 06/09/2021 ?Discharge date: 06/14/2021 ? ?Admitted From: Home ?Disposition:  Home ? ?Discharge Condition:Stable ?CODE STATUS:FULL ?Diet recommendation: Regular  ? ?Brief/Interim Summary: ?Patient is a 26 year old female with history of SVT, ADHD, anxiety, depression who presented here with intractable nausea and vomiting.  She was just discharged 4 days ago after managed for the same.  She came back with persistent nausea and vomiting.  GI consulted here and she underwent EGD without finding of any significant abnormalities.  EGD showed normal esophagus, stomach, first/second portion of the duodenum.  Patient tolerated solid diet today.  She is medically stable for discharge to home. ? ?Following problems were addressed during her hospitalization: ? ?Intractable nausea/vomiting: Was admitted few days ago for the same, discharged to home but came back with similar symptoms.  GI was consulted on earlier admission also.  There was concern for hyperemesis cannabinoid syndrome versus gastroparesis.  Gastric emptying study done on 4/10 was within normal limit.  Denies frequent use of cannabis. ?Treated with IV fluids, antiemetics. ?Underwent EGD.EGD showed normal esophagus, stomach, first/second portion of the duodenum.  Patient tolerated solid diet today.  ? ?Hypokalemia: Supplemented and corrected ?  ?Anxiety/depression: On hydroxyzine.  Patient is planning for outpatient follow-up with psychiatry. ?  ? ? ?Discharge Diagnoses:  ?Principal Problem: ?  Intractable nausea and vomiting ?Active Problems: ?  Anxiety and depression ?  Hypokalemia ?  Malnutrition of moderate degree ? ? ? ?Discharge Instructions ? ?Discharge Instructions   ? ? Diet general   Complete by: As directed ?  ? Discharge instructions   Complete by: As directed ?  ? 1)Please follow up with your PCP in a week  ? Increase activity  slowly   Complete by: As directed ?  ? ?  ? ?Allergies as of 06/14/2021   ? ?   Reactions  ? Kiwi Extract Itching, Other (See Comments)  ? Tongue itching  ? Reglan [metoclopramide] Anxiety  ? ?  ? ?  ?Medication List  ?  ? ?STOP taking these medications   ? ?potassium chloride SA 20 MEQ tablet ?Commonly known as: KLOR-CON M ?  ?prochlorperazine 25 MG suppository ?Commonly known as: COMPAZINE ?  ?promethazine 25 MG tablet ?Commonly known as: PHENERGAN ?  ? ?  ? ?TAKE these medications   ? ?(feeding supplement) PROSource Plus liquid ?Take 30 mLs by mouth 3 (three) times daily between meals. ?  ?ASHWAGANDHA PO ?Take 1 capsule by mouth daily with breakfast. ?  ?hydrOXYzine 25 MG tablet ?Commonly known as: ATARAX ?Take 1 tablet (25 mg total) by mouth every 6 (six) hours as needed for anxiety. ?  ?MAGNESIUM PO ?Take 1 tablet by mouth daily as needed (for leg cramps). ?  ?ondansetron 4 MG disintegrating tablet ?Commonly known as: ZOFRAN-ODT ?Take 1 tablet (4 mg total) by mouth every 8 (eight) hours as needed for nausea or vomiting. ?What changed: reasons to take this ?  ?One-A-Day Womens Formula Tabs ?Take 1 tablet by mouth daily with breakfast. ?  ?pantoprazole 40 MG tablet ?Commonly known as: Protonix ?Take 1 tablet (40 mg total) by mouth daily. ?What changed: when to take this ?  ?propranolol 10 MG tablet ?Commonly known as: INDERAL ?Take 0.5 tablets (5 mg total) by mouth 3 (three) times daily as needed. ?What changed: reasons to take this ?  ? ?  ? ? Follow-up Information   ? ?  de [place], Buren Kos, MD. Schedule an appointment as soon as possible for a visit in 1 week(s).   ?Specialty: Family Medicine ?Contact information: ?[address] ?[place] [address] ?[phone] ? ? ?  ?  ? ?  ?  ? ?  ? ?Allergies  ?Allergen Reactions  ? Kiwi Extract Itching and Other (See Comments)  ?  Tongue itching  ? Reglan [Metoclopramide] Anxiety  ? ? ?Consultations: ?GI ? ? ?Procedures/Studies: ?DG Chest 2 View ? ?Result Date:  05/30/2021 ?CLINICAL DATA:  Chest pain and palpitation. EXAM: CHEST - 2 VIEW COMPARISON:  Chest radiograph dated 05/14/2021. FINDINGS: The heart size and mediastinal contours are within normal limits. Both lungs are clear. The visualized skeletal structures are unremarkable. IMPRESSION: No active cardiopulmonary disease. Electronically Signed   By: Elgie Collard M.D.   On: 05/30/2021 03:55  ? ?CT Head Wo Contrast ? ?Result Date: 05/31/2021 ?CLINICAL DATA:  Sudden onset headaches following syncopal episode, initial encounter EXAM: CT HEAD WITHOUT CONTRAST TECHNIQUE: Contiguous axial images were obtained from the base of the skull through the vertex without intravenous contrast. RADIATION DOSE REDUCTION: This exam was performed according to the departmental dose-optimization program which includes automated exposure control, adjustment of the mA and/or kV according to patient size and/or use of iterative reconstruction technique. COMPARISON:  None. FINDINGS: Brain: No evidence of acute infarction, hemorrhage, hydrocephalus, extra-axial collection or mass lesion/mass effect. Vascular: No hyperdense vessel or unexpected calcification. Skull: Normal. Negative for fracture or focal lesion. Sinuses/Orbits: No acute finding. Other: None. IMPRESSION: No acute intracranial abnormality noted. Electronically Signed   By: Alcide Clever M.D.   On: 05/31/2021 02:16  ? ?NM GASTRIC EMPTYING ? ?Result Date: 06/11/2021 ?CLINICAL DATA:  Intractable nausea and vomiting. EXAM: NUCLEAR MEDICINE GASTRIC EMPTYING SCAN TECHNIQUE: After oral ingestion of radiolabeled meal, sequential abdominal images were obtained for 4 hours. Percentage of activity emptying the stomach was calculated at 1 hour, 2 hour, 3 hour, and 4 hours. RADIOPHARMACEUTICALS:  2.0 mCi Tc-60m sulfur colloid in standardized meal COMPARISON:  None. FINDINGS: Expected location of the stomach in the left upper quadrant. Ingested meal empties the stomach gradually over the course  of the study. 51% emptied at 1 hr ( normal >= 10%) 70% emptied at 2 hr ( normal >= 40%) 88% emptied at 3 hr ( normal >= 70%) 92% emptied at 4 hr ( normal >= 90%) IMPRESSION: Normal gastric emptying study. Electronically Signed   By: Irish Lack M.D.   On: 06/11/2021 16:23  ? ?CT ABDOMEN PELVIS W CONTRAST ? ?Result Date: 06/02/2021 ?CLINICAL DATA:  Abdominal pain, acute, nonlocalized EXAM: CT ABDOMEN AND PELVIS WITH CONTRAST TECHNIQUE: Multidetector CT imaging of the abdomen and pelvis was performed using the standard protocol following bolus administration of intravenous contrast. RADIATION DOSE REDUCTION: This exam was performed according to the departmental dose-optimization program which includes automated exposure control, adjustment of the mA and/or kV according to patient size and/or use of iterative reconstruction technique. CONTRAST:  OMNIPAQUE IOHEXOL 300 MG/ML  SOLN COMPARISON:  CT 12/05/2020, right upper quadrant ultrasound 06/02/2021 FINDINGS: Lower chest: Included lung bases are clear.  Heart size is normal. Hepatobiliary: No focal liver abnormality is seen. No gallstones, gallbladder wall thickening, or biliary dilatation. Pancreas: Unremarkable. No pancreatic ductal dilatation or surrounding inflammatory changes. Spleen: Normal in size without focal abnormality. Adrenals/Urinary Tract: Unremarkable adrenal glands. Kidneys enhance symmetrically without focal lesion, stone, or hydronephrosis. Ureters are nondilated. Urinary bladder appears unremarkable for the degree of distension. Stomach/Bowel: Stomach  is within normal limits. Appendix appears normal (series 4, image 63). No evidence of bowel wall thickening, distention, or inflammatory changes. Vascular/Lymphatic: No significant vascular findings are present. No enlarged abdominal or pelvic lymph nodes. Reproductive: Retroverted uterus containing IUD. Adnexal regions within normal limits for age. Other: No free fluid. No abdominopelvic fluid  collection. No pneumoperitoneum. No abdominal wall hernia. Musculoskeletal: No acute or significant osseous findings. IMPRESSION: No acute abdominopelvic findings. Electronically Signed   By: Janyth PupaNicholas

## 2021-06-14 NOTE — Transfer of Care (Signed)
Immediate Anesthesia Transfer of Care Note ? ?Patient: Kristina Blair ? ?Procedure(s) Performed: ESOPHAGOGASTRODUODENOSCOPY (EGD) WITH PROPOFOL ? ?Patient Location: PACU ? ?Anesthesia Type:MAC ? ?Level of Consciousness: drowsy ? ?Airway & Oxygen Therapy: Patient Spontanous Breathing ? ?Post-op Assessment: Report given to RN and Post -op Vital signs reviewed and stable ? ?Post vital signs: Reviewed and stable ? ?Last Vitals:  ?Vitals Value Taken Time  ?BP 129/87 06/14/21 1007  ?Temp    ?Pulse 109 06/14/21 1008  ?Resp 17 06/14/21 1008  ?SpO2 100 % 06/14/21 1008  ?Vitals shown include unvalidated device data. ? ?Last Pain:  ?Vitals:  ? 06/14/21 0854  ?TempSrc: Tympanic  ?PainSc: 5   ?   ? ?  ? ?Complications: No notable events documented. ?

## 2021-06-14 NOTE — Anesthesia Postprocedure Evaluation (Signed)
Anesthesia Post Note ? ?Patient: Kristina Blair ? ?Procedure(s) Performed: ESOPHAGOGASTRODUODENOSCOPY (EGD) WITH PROPOFOL ? ?  ? ?Patient location during evaluation: PACU ?Anesthesia Type: MAC ?Level of consciousness: awake and alert ?Pain management: pain level controlled ?Vital Signs Assessment: post-procedure vital signs reviewed and stable ?Respiratory status: spontaneous breathing and respiratory function stable ?Cardiovascular status: stable ?Postop Assessment: no apparent nausea or vomiting ?Anesthetic complications: no ? ? ?No notable events documented. ? ?Last Vitals:  ?Vitals:  ? 06/14/21 1022 06/14/21 1043  ?BP: 123/77 111/81  ?Pulse: 90 71  ?Resp: 17 17  ?Temp: 36.6 ?C (!) 36.3 ?C  ?SpO2: 93% 100%  ?  ?Last Pain:  ?Vitals:  ? 06/14/21 1043  ?TempSrc: Oral  ?PainSc:   ? ? ?  ?  ?  ?  ?  ?  ? ?Merlinda Frederick ? ? ? ? ?

## 2021-06-14 NOTE — Anesthesia Preprocedure Evaluation (Addendum)
Anesthesia Evaluation  ?Patient identified by MRN, date of birth, ID band ?Patient awake ? ? ? ?Reviewed: ?Allergy & Precautions, NPO status , Patient's Chart, lab work & pertinent test results ? ?Airway ?Mallampati: II ? ?TM Distance: >3 FB ?Neck ROM: Full ? ? ? Dental ?no notable dental hx. ? ?  ?Pulmonary ?asthma ,  ?  ?Pulmonary exam normal ?breath sounds clear to auscultation ? ? ? ? ? ? Cardiovascular ?Exercise Tolerance: Good ?negative cardio ROS ?Normal cardiovascular exam ?Rhythm:Regular Rate:Normal ? ? ?  ?Neuro/Psych ?PSYCHIATRIC DISORDERS Anxiety Depression negative neurological ROS ?   ? GI/Hepatic ?Neg liver ROS, GERD  ,Chronic n/v ?  ?Endo/Other  ?negative endocrine ROS ? Renal/GU ?negative Renal ROS  ?negative genitourinary ?  ?Musculoskeletal ?negative musculoskeletal ROS ?(+)  ? Abdominal ?  ?Peds ?negative pediatric ROS ?(+)  Hematology ?negative hematology ROS ?(+)   ?Anesthesia Other Findings ? ? Reproductive/Obstetrics ?negative OB ROS ? ?  ? ? ? ? ? ? ? ? ? ? ? ? ? ?  ?  ? ? ? ? ? ? ? ? ?Anesthesia Physical ?Anesthesia Plan ? ?ASA: 2 ? ?Anesthesia Plan: MAC  ? ?Post-op Pain Management:   ? ?Induction: Intravenous ? ?PONV Risk Score and Plan: 2 and Propofol infusion, TIVA, Treatment may vary due to age or medical condition and Ondansetron ? ?Airway Management Planned: Natural Airway and Simple Face Mask ? ?Additional Equipment: None ? ?Intra-op Plan:  ? ?Post-operative Plan:  ? ?Informed Consent: I have reviewed the patients History and Physical, chart, labs and discussed the procedure including the risks, benefits and alternatives for the proposed anesthesia with the patient or authorized representative who has indicated his/her understanding and acceptance.  ? ? ? ?Dental advisory given ? ?Plan Discussed with: CRNA, Anesthesiologist and Surgeon ? ?Anesthesia Plan Comments: (No nausea/vomiting today. OK for MAC. Norton Blizzard, MD  ?)  ? ? ? ? ? ?Anesthesia Quick  Evaluation ? ?

## 2021-06-14 NOTE — Progress Notes (Signed)
Patient discharged to home via wheelchair with mother with all belongings. ?

## 2021-06-15 ENCOUNTER — Encounter (HOSPITAL_COMMUNITY): Payer: Self-pay | Admitting: Gastroenterology

## 2021-06-16 ENCOUNTER — Other Ambulatory Visit: Payer: Self-pay

## 2021-06-16 ENCOUNTER — Encounter (HOSPITAL_BASED_OUTPATIENT_CLINIC_OR_DEPARTMENT_OTHER): Payer: Self-pay | Admitting: Emergency Medicine

## 2021-06-16 ENCOUNTER — Emergency Department (HOSPITAL_BASED_OUTPATIENT_CLINIC_OR_DEPARTMENT_OTHER)
Admission: EM | Admit: 2021-06-16 | Discharge: 2021-06-16 | Disposition: A | Discharge status: Home / Self Care | Payer: No Typology Code available for payment source | Attending: Emergency Medicine | Admitting: Emergency Medicine

## 2021-06-16 ENCOUNTER — Emergency Department (HOSPITAL_BASED_OUTPATIENT_CLINIC_OR_DEPARTMENT_OTHER): Payer: No Typology Code available for payment source

## 2021-06-16 DIAGNOSIS — M79662 Pain in left lower leg: Secondary | ICD-10-CM

## 2021-06-16 MED ORDER — IBUPROFEN 400 MG PO TABS
600.0000 mg | ORAL_TABLET | Freq: Once | ORAL | Status: AC
Start: 2021-06-16 — End: 2021-06-16
  Administered 2021-06-16: 600 mg via ORAL
  Filled 2021-06-16: qty 1

## 2021-06-16 NOTE — ED Triage Notes (Signed)
Left leg calf/ankle pain, sharp,shooting started this am.  ?

## 2021-06-16 NOTE — Progress Notes (Signed)
VASCULAR LAB ? ? ? ?Left lower extremity venous duplex has been performed. ? ?See CV proc for preliminary results. ? ?Gave verbal results to [employer], PA-C ? ?Rece Zechman, RVT ?06/16/2021, 12:16 PM ? ?

## 2021-06-16 NOTE — Discharge Instructions (Addendum)
? ?  Your ultrasound was negative for acute clot in the leg.  Please follow-up with your family doctor in the office.  If this pain persists then they may reultrasounded or could have you see physical therapy or follow-up with a different provider. ? ?Your back pain is most likely due to a muscular strain.  There is been a lot of research on back pain, unfortunately the only thing that seems to really help is Tylenol and ibuprofen.  Relative rest is also important to not lift greater than 10 pounds bending or twisting at the waist.  Please follow-up with your family physician.  The other thing that really seems to benefit patients is physical therapy which your doctor may send you for.  Please return to the emergency department for new numbness or weakness to your arms or legs. Difficulty with urinating or urinating or pooping on yourself.  Also if you cannot feel toilet paper when you wipe or get a fever.  ? ?Take 4 over the counter ibuprofen tablets 3 times a day or 2 over-the-counter naproxen tablets twice a day for pain. ?Also take tylenol 1000mg (2 extra strength) four times a day.  ? ?

## 2021-06-16 NOTE — ED Notes (Signed)
Pt to transfer POV ED to Ed for ultrasound, accepting Dr.Floyd. pt given instrucitons. Unable to reach [facility] charge nurse by phone.  ?

## 2021-06-16 NOTE — ED Provider Notes (Signed)
?[facility] EMERGENCY DEPT ?Provider Note ? ? ?CSN: [id number] ?Arrival date & time: 06/16/21  [id number] ? ?  ? ?History ? ?Chief Complaint  ?Patient presents with  ? Leg Pain  ? ? ?Kristina Blair is a 26 y.o. female. ? ? ?Leg Pain ?Patient presents for cute onset of left lower leg pain.  She had a recent hospitalization for intractable vomiting.  In total she was hospitalized for 5 days.  She was discharged 2 days ago.  She does state that she was getting DVT prophylaxis shots while hospitalized.  She has no history of blood clots.  She has not had any recent traumatic injuries.  This morning, she awoke with pain in area of left calf.  Pain is worsened with palpation.  She has not had any other areas of discomfort.  Currently, pain is 7/10 in severity. ?  ? ?Home Medications ?Prior to Admission medications   ?Medication Sig Start Date End Date Taking? Authorizing Provider  ?ASHWAGANDHA PO Take 1 capsule by mouth daily with breakfast.    [provider]  ?hydrOXYzine (ATARAX) 25 MG tablet Take 1 tablet (25 mg total) by mouth every 6 (six) hours as needed for anxiety. 06/06/21   Quintella Reichert, MD  ?MAGNESIUM PO Take 1 tablet by mouth daily as needed (for leg cramps).    [provider]  ?Multiple Vitamins-Calcium (ONE-A-DAY WOMENS FORMULA) TABS Take 1 tablet by mouth daily with breakfast.    [provider]  ?Nutritional Supplements (,FEEDING SUPPLEMENT, PROSOURCE PLUS) liquid Take 30 mLs by mouth 3 (three) times daily between meals. 06/14/21   Shelly Coss, MD  ?ondansetron (ZOFRAN-ODT) 4 MG disintegrating tablet Take 1 tablet (4 mg total) by mouth every 8 (eight) hours as needed for nausea or vomiting. 06/14/21   Shelly Coss, MD  ?pantoprazole (PROTONIX) 40 MG tablet Take 1 tablet (40 mg total) by mouth daily. ?Patient taking differently: Take 40 mg by mouth daily before breakfast. 06/05/21 06/05/22  Shawna Clamp, MD  ?propranolol (INDERAL) 10 MG tablet Take 0.5 tablets (5 mg  total) by mouth 3 (three) times daily as needed. ?Patient taking differently: Take 5 mg by mouth 3 (three) times daily as needed (elevated heart rate or palpitations). 06/01/21   Loel Dubonnet, NP  ?   ? ?Allergies    ?Kiwi extract and Reglan [metoclopramide]   ? ?Review of Systems   ?Review of Systems  ?Musculoskeletal:  Positive for myalgias.  ?All other systems reviewed and are negative. ? ?Physical Exam ?Updated Vital Signs ?BP 132/84 (BP Location: Right Arm)   Pulse 86   Temp 98.8 ?F (37.1 ?C) (Oral)   Resp 12   LMP  (LMP Unknown)   SpO2 98%  ?Physical Exam ?Vitals and nursing note reviewed.  ?Constitutional:   ?   General: She is not in acute distress. ?   Appearance: Normal appearance. She is well-developed and normal weight. She is not ill-appearing, toxic-appearing or diaphoretic.  ?HENT:  ?   Head: Normocephalic and atraumatic.  ?   Right Ear: External ear normal.  ?   Left Ear: External ear normal.  ?   Nose: Nose normal.  ?   Mouth/Throat:  ?   Mouth: Mucous membranes are moist.  ?   Pharynx: Oropharynx is clear.  ?Eyes:  ?   Extraocular Movements: Extraocular movements intact.  ?   Conjunctiva/sclera: Conjunctivae normal.  ?Cardiovascular:  ?   Rate and Rhythm: Normal rate and regular rhythm.  ?   Heart  sounds: No murmur heard. ?Pulmonary:  ?   Effort: Pulmonary effort is normal. No respiratory distress.  ?Abdominal:  ?   Palpations: Abdomen is soft.  ?   Tenderness: There is no abdominal tenderness.  ?Musculoskeletal:     ?   General: Tenderness present. No swelling, deformity or signs of injury. Normal range of motion.  ?   Cervical back: Normal range of motion and neck supple.  ?Skin: ?   General: Skin is warm and dry.  ?   Capillary Refill: Capillary refill takes less than 2 seconds.  ?   Coloration: Skin is not jaundiced or pale.  ?Neurological:  ?   General: No focal deficit present.  ?   Mental Status: She is alert and oriented to person, place, and time.  ?   Cranial Nerves: No cranial  nerve deficit.  ?   Sensory: No sensory deficit.  ?   Motor: No weakness.  ?   Coordination: Coordination normal.  ?Psychiatric:     ?   Mood and Affect: Mood normal.     ?   Behavior: Behavior normal.     ?   Thought Content: Thought content normal.     ?   Judgment: Judgment normal.  ? ? ?ED Results / Procedures / Treatments   ?Labs ?(all labs ordered are listed, but only abnormal results are displayed) ?Labs Reviewed - No data to display ? ?EKG ?None ? ?Radiology ?No results found. ? ?Procedures ?Procedures  ? ? ?Medications Ordered in ED ?Medications - No data to display ? ?ED Course/ Medical Decision Making/ A&P ?  ?                        ?Medical Decision Making ? ?Healthy 26 year old female presenting for acute onset of left calf pain and tenderness.  She denies any recent injury.  Onset was this morning upon awakening.  She did have a recent 5-day hospitalization for intractable vomiting.  She was discharged 2 days ago.  Differential diagnosis includes DVT, myositis, muscle cramp.  Her evaluation is complicated by a history of  SVT, ADHD, anxiety, depression.  Patient is well-appearing on arrival.  I do not appreciate any swelling or erythema to her left lower extremity.  There is no deformity.  Given absence of recent injury, I do not feel x-ray imaging is indicated at this time.  Patient would benefit from ultrasound DVT study.  She does have calf tenderness present.  Unfortunately, at this [facility], ultrasound is unavailable today.  I spoke with the patient about transfer for DVT study.  Patient is agreeable to this.  Patient was transferred via POV to [facility] for ultrasound. ? ? ? ? ? ? ? ?Final Clinical Impression(s) / ED Diagnoses ?Final diagnoses:  ?Pain of left calf  ? ? ?Rx / DC Orders ?ED Discharge Orders   ? ? None  ? ?  ? ? ?  ?Godfrey Pick, MD ?06/16/21 [phone] ? ?

## 2021-06-16 NOTE — ED Provider Notes (Signed)
The patient is a 26 year old female with a chief complaint of left leg pain transferred from [place] as they do not have ultrasound coverage today to evaluate for a DVT. ? ?Wet read of the DVT T study is negative.  Clinically unlikely to be DVT on exam.  She is complaining of pain that radiates down from the top of her leg down to the calf.  More likely sciatica that is post nausea vomiting diarrheal illness.  No red flags.  Reflexes are 2+ and equal.  No clonus.  Negative Babinski.  We will have the patient follow-up with her family doctor in the office. ?  Deno Etienne, DO ?06/16/21 1214 ? ?

## 2021-06-18 ENCOUNTER — Encounter: Payer: Self-pay | Admitting: Family

## 2021-06-18 ENCOUNTER — Ambulatory Visit (INDEPENDENT_AMBULATORY_CARE_PROVIDER_SITE_OTHER): Payer: No Typology Code available for payment source | Admitting: Family

## 2021-06-18 VITALS — BP 90/58 | HR 87 | Temp 98.8°F | Ht 61.0 in | Wt 118.2 lb

## 2021-06-18 DIAGNOSIS — R5383 Other fatigue: Secondary | ICD-10-CM | POA: Insufficient documentation

## 2021-06-18 DIAGNOSIS — R002 Palpitations: Secondary | ICD-10-CM

## 2021-06-18 DIAGNOSIS — R634 Abnormal weight loss: Secondary | ICD-10-CM | POA: Diagnosis not present

## 2021-06-18 DIAGNOSIS — R630 Anorexia: Secondary | ICD-10-CM | POA: Diagnosis not present

## 2021-06-18 HISTORY — DX: Abnormal weight loss: R63.4

## 2021-06-18 LAB — T4, FREE: Free T4: 0.87 ng/dL (ref 0.60–1.60)

## 2021-06-18 LAB — SEDIMENTATION RATE: Sed Rate: 23 mm/hr — ABNORMAL HIGH (ref 0–20)

## 2021-06-18 LAB — TSH: TSH: 1.16 u[IU]/mL (ref 0.35–5.50)

## 2021-06-18 MED ORDER — PROPRANOLOL HCL 10 MG PO TABS: 10.0000 mg | ORAL_TABLET | Freq: Three times a day (TID) | ORAL | 1 refills | Status: DC | PRN

## 2021-06-18 MED ORDER — ONDANSETRON 4 MG PO TBDP: 4.0000 mg | ORAL_TABLET | Freq: Three times a day (TID) | ORAL | 0 refills | Status: DC | PRN

## 2021-06-18 MED ORDER — DRONABINOL 2.5 MG PO CAPS: 2.5000 mg | ORAL_CAPSULE | Freq: Two times a day (BID) | ORAL | 0 refills | Status: DC

## 2021-06-18 NOTE — Assessment & Plan Note (Addendum)
started little over 2 mos ago with multiple other symptoms, overall myalgia, intractable vomiting, headaches, palpitations, weight loss, she has had multiple ER visits and admissions, with no etiology found, GI & Cardiology causes ruled out.  Running autoimmune labs today, TSH normal in 01/2021, will check free T4 today, pt has yet to be seen by Neuro. pt here with her GM, reports having some anxiety prior to her moving in with her, but has been better. Works for [employer] at home. ?

## 2021-06-18 NOTE — Patient Instructions (Addendum)
Welcome to [facility]! It was a pleasure meeting you today. ? ?Go to the lab for blood work today. ?As discussed, if all negative, I will refer you to Neurology for your continuous nausea, vomiting, headaches. ? ?I have sent over Dronabinol to your pharmacy to help your appetite, let me know if you can't tolerate. ? ?Try and take 1 full Propranolol at least every morning after eating and drinking and see if this helps your palpitations & fast heart rate better. If you feel lightheaded, check your blood pressure, which should be greater than 95/60, if lower, then do not take a full pill and let me know. ? ?Have to drink sips of water or any beverage you can tolerate to stay hydrated. If you can't keep fluids down, this will only make you feel worse. Also try to eat small bites of food throughout the day to keep up your energy. ? ? ? ? ? ? ?PLEASE NOTE: ? ?If you had any LAB tests please let us know if you have not heard back within a few days. You may see your results on MyChart before we have a chance to review them but we will give you a call once they are reviewed by [place]. If we ordered any REFERRALS today, please let us know if you have not heard from their office within the next week.  ?Let us know through MyChart if you are needing REFILLS, or have your pharmacy send [place] the request. You can also use MyChart to communicate with me or any office staff. ? ?Please try these tips to maintain a healthy lifestyle: ? ?Eat most of your calories during the day when you are active. Eliminate processed foods including packaged sweets (pies, cakes, cookies), reduce intake of potatoes, white bread, white pasta, and white rice. Look for whole grain options, oat flour or almond flour. ? ?Each meal should contain half fruits/vegetables, one quarter protein, and one quarter carbs (no bigger than a computer mouse). ? ?Cut down on sweet beverages. This includes juice, soda, and sweet tea. Also watch  fruit intake, though this is a healthier sweet option, it still contains natural sugar! Limit to 3 servings daily. ? ?Drink at least 1 glass of water with each meal and aim for at least 8 glasses per day ? ?Exercise at least 150 minutes every week.  ? ?

## 2021-06-18 NOTE — Progress Notes (Signed)
? ?New Patient Office Visit ? ?Subjective:  ?Patient ID: Kristina Blair, female    DOB: Jan 19, 1996  Age: 26 y.o. MRN: [id number] ? ?CC:  ?Chief Complaint  ?Patient presents with  ? Establish Care  ?  Primary care was sports medicine and she needs a primary care doctor.   ? Palpitations  ?  Pt c/o palpitations. States propanolol did not help. She was seen by cardiology and they stated everything is fine.   ? Loss of Consciousness  ?  Pt c/o fainting randomly, 2-3 weeks ago.   ? Numbness  ?  Pt c/o numbness in extremities. Fatigue/ weakness.  ? Muscle Pain  ?  Pt c/o top of neck and down limbs.Tizanidine does help with muscle pains.   ? ? ?HPI ?Kristina Blair presents for establishing care. ? ?Fatigue ?Pt. reports onset: 2 mos ago.Hx of fatigue: no. Occurs upon awakening:  yes; during the day; evenings. Hours of sleep: 5. Works during: day. New medications or change in current med doses: yes - multiple meds due to many different problems starting at the same time. Recent labs: yes- but no autoimmune in EMR; Exercise: no.  ?High carb/sugar diet: no- pt has intractable N,V - no appetite ?Sx of depression: yes. ?Myalgia:  pt reports having overall muscles soreness, aching from head to toe. Sx started about 2 mos ago along with numbness in extremities, fatigue, weakness, syncope, palpitations, headaches, nausea and vomiting. She has had multiple hospitalizations & ER visits, no etiology found so far.  ?Palpitations: Patient complains of dizziness, palpitations, rapid heart beat, and syncope.  The symptoms are of moderate severity, occuring intermittently and lasting  months per episode. Cardiac risk factors include: none. Aggravating factors: none. Relieving factors: none. Reports 5mg  Propranolol not working. Associated signs and symptoms: has complaint(s) of fatigue and palpitations. But wore holter monitor for 2 weeks with no abnormal activity found. ? ?Past Medical History:  ?Diagnosis Date  ? Allergy   ? ASCUS of cervix with  negative high risk HPV   ? Asthma   ? Phreesia 02/13/2020  ? GERD (gastroesophageal reflux disease)   ? [employer]rees[employer] 02/13/2020  ? IUD (intrauterine device) in place 09/2019  ? liletta  ? Paroxysmal SVT (supraventricular tachycardia) (HCC)   ? ? ?Past Surgical History:  ?Procedure Laterality Date  ? ESOPHAGOGASTRODUODENOSCOPY (EGD) WITH PROPOFOL N/A 06/14/2021  ? Procedure: ESOPHAGOGASTRODUODENOSCOPY (EGD) WITH PROPOFOL;  Surgeon: Arta Silence, MD;  Location: [place];  Service: Gastroenterology;  Laterality: N/A;  ? WISDOM TOOTH EXTRACTION    ? ? ?Family History  ?Problem Relation Age of Onset  ? Diabetes Mother   ? Hypertension Mother   ? Breast cancer Mother 96  ?     recurrence age 94  ? Early death Mother   ? Hyperlipidemia Mother   ? Miscarriages / [place] Mother   ? Obesity Mother   ? Alcohol abuse Mother   ? Cancer Mother   ? Bipolar disorder Father   ? Cerebral palsy Brother   ? Epilepsy Brother   ? ADD / ADHD Brother   ? Diabetes Maternal Grandmother   ? Heart disease Maternal Grandmother   ? Hyperlipidemia Maternal Grandmother   ? Hypertension Maternal Grandmother   ? Arthritis Maternal Grandmother   ? COPD Maternal Grandmother   ? Hearing loss Maternal Grandmother   ? Cancer Maternal Grandfather   ?     leukemia  ? Early death Maternal Grandfather   ? Cancer Maternal Aunt   ?  uterine cancer, passed age 59  ? Asthma Maternal Aunt   ? Ovarian cancer Maternal Great-grandmother   ?     passed aroung age 1  ? ? ?Social History  ? ?Socioeconomic History  ? Marital status: Single  ?  Spouse name: Not on file  ? Number of children: Not on file  ? Years of education: Not on file  ? Highest education level: Not on file  ?Occupational History  ? Not on file  ?Tobacco Use  ? Smoking status: Never  ? Smokeless tobacco: Never  ?Vaping Use  ? Vaping Use: Never used  ?Substance and Sexual Activity  ? Alcohol use: Never  ? Drug use: Never  ? Sexual activity: Not Currently  ?  Birth control/protection: I.U.D.   ?Other Topics Concern  ? Not on file  ?Social History Narrative  ? Not on file  ? ?Social Determinants of Health  ? ?Financial Resource Strain: Not on file  ?Food Insecurity: Not on file  ?Transportation Needs: Not on file  ?Physical Activity: Not on file  ?Stress: Not on file  ?Social Connections: Not on file  ?Intimate Partner Violence: Not on file  ? ? ?Objective:  ? ?Today's Vitals: BP (!) 90/58 (BP Location: Left Arm, Patient Position: Sitting, Cuff Size: Large)   Pulse 87   Temp 98.8 ?F (37.1 ?C) (Temporal)   Ht 5\' 1"  (1.549 m)   Wt 118 lb 4 oz (53.6 kg)   LMP 06/13/2021 (Exact Date)   SpO2 99%   BMI 22.34 kg/m?  ? ?Physical Exam ?Vitals and nursing note reviewed.  ?Constitutional:   ?   Appearance: Normal appearance.  ?Cardiovascular:  ?   Rate and Rhythm: Normal rate and regular rhythm.  ?Pulmonary:  ?   Effort: Pulmonary effort is normal.  ?   Breath sounds: Normal breath sounds.  ?Musculoskeletal:     ?   General: Normal range of motion.  ?Skin: ?   General: Skin is warm and dry.  ?Neurological:  ?   Mental Status: She is alert.  ?Psychiatric:     ?   Mood and Affect: Mood normal.     ?   Behavior: Behavior normal.  ? ? ?Assessment & Plan:  ? ?Problem List Items Addressed This Visit   ? ?  ? Other  ? Rapid palpitations  ?  pt seen by Cardiology, cardiac etiology ruled out. Taking 5mg  Propranolol which is not working. pt denies any new stress/anxiety. Advised to increase to 10 mg tid prn, but needs to monitor BP daily and if higher dose is still not working, stop taking. f/u in 1 mo.  ? ?  ?  ? Relevant Medications  ? propranolol (INDERAL) 10 MG tablet  ? Abnormal weight loss  ?  pt has lost 36 lbs in 71mos, 10lbs just in last month d/t intractable vomiting. Taking Zofran q4h with little relief. Starting Marinol, low dose, advised on use & SE. pt has seen GI, EGD negative. Has referral to [facility] sent by previous PCP. f/u in 54month. ? ?  ?  ? Relevant Medications  ? dronabinol (MARINOL) 2.5 MG  capsule  ? Fatigue - Primary  ?  started little over 2 mos ago with multiple other symptoms, overall myalgia, intractable vomiting, headaches, palpitations, weight loss, she has had multiple ER visits and admissions, with no etiology found, GI & Cardiology causes ruled out.  Running autoimmune labs today, TSH normal in 01/2021, will check free T4 today, pt  has yet to be seen by Neuro. pt here with her GM, reports having some anxiety prior to her moving in with her, but has been better. Works for [employer] at home. ? ?  ?  ? Relevant Orders  ? Autoimmune Profile  ? Extractable Nuclear antigen ab  ? Jo-1 antibody-IgG  ? Rheumatoid factor  ? Sedimentation rate (Completed)  ? T4, free (Completed)  ? TSH (Completed)  ? ?Other Visit Diagnoses   ? ? Decreased appetite      ? Relevant Medications  ? dronabinol (MARINOL) 2.5 MG capsule  ? ?  ? ? ?Outpatient Encounter Medications as of 06/18/2021  ?Medication Sig  ? ASHWAGANDHA PO Take 1 capsule by mouth daily with breakfast.  ? dronabinol (MARINOL) 2.5 MG capsule Take 1 capsule (2.5 mg total) by mouth 2 (two) times daily before a meal.  ? hydrOXYzine (ATARAX) 25 MG tablet Take 1 tablet (25 mg total) by mouth every 6 (six) hours as needed for anxiety.  ? MAGNESIUM PO Take 1 tablet by mouth daily as needed (for leg cramps).  ? Multiple Vitamins-Calcium (ONE-A-DAY WOMENS FORMULA) TABS Take 1 tablet by mouth daily with breakfast.  ? Nutritional Supplements (,FEEDING SUPPLEMENT, PROSOURCE PLUS) liquid Take 30 mLs by mouth 3 (three) times daily between meals.  ? pantoprazole (PROTONIX) 40 MG tablet Take 1 tablet (40 mg total) by mouth daily. (Patient taking differently: Take 40 mg by mouth daily before breakfast.)  ? tizanidine (ZANAFLEX) 2 MG capsule Take by mouth.  ? [DISCONTINUED] ondansetron (ZOFRAN-ODT) 4 MG disintegrating tablet Take 1 tablet (4 mg total) by mouth every 8 (eight) hours as needed for nausea or vomiting.  ? ondansetron (ZOFRAN-ODT) 4 MG disintegrating tablet Take 1  tablet (4 mg total) by mouth every 8 (eight) hours as needed for nausea or vomiting.  ? propranolol (INDERAL) 10 MG tablet Take 1 tablet (10 mg total) by mouth 3 (three) times daily as needed. If BP <95/60

## 2021-06-18 NOTE — Assessment & Plan Note (Signed)
pt has lost 36 lbs in 48mos, 10lbs just in last month d/t intractable vomiting. Taking Zofran q4h with little relief. Starting Marinol, low dose, advised on use & SE. pt has seen GI, EGD negative. Has referral to [facility] sent by previous PCP. f/u in 62month. ?

## 2021-06-18 NOTE — Assessment & Plan Note (Signed)
pt seen by Cardiology, cardiac etiology ruled out. Taking 5mg  Propranolol which is not working. pt denies any new stress/anxiety. Advised to increase to 10 mg tid prn, but needs to monitor BP daily and if higher dose is still not working, stop taking. f/u in 1 mo.  ?

## 2021-06-19 ENCOUNTER — Encounter (HOSPITAL_COMMUNITY): Payer: Self-pay

## 2021-06-19 ENCOUNTER — Emergency Department (HOSPITAL_COMMUNITY): Payer: No Typology Code available for payment source

## 2021-06-19 ENCOUNTER — Emergency Department (HOSPITAL_COMMUNITY)
Admission: EM | Admit: 2021-06-19 | Discharge: 2021-06-19 | Disposition: A | Discharge status: Home / Self Care | Payer: No Typology Code available for payment source | Attending: Emergency Medicine | Admitting: Emergency Medicine

## 2021-06-19 DIAGNOSIS — R0600 Dyspnea, unspecified: Secondary | ICD-10-CM | POA: Diagnosis not present

## 2021-06-19 DIAGNOSIS — R200 Anesthesia of skin: Secondary | ICD-10-CM | POA: Insufficient documentation

## 2021-06-19 DIAGNOSIS — R0602 Shortness of breath: Secondary | ICD-10-CM | POA: Diagnosis not present

## 2021-06-19 DIAGNOSIS — Z5321 Procedure and treatment not carried out due to patient leaving prior to being seen by health care provider: Secondary | ICD-10-CM | POA: Insufficient documentation

## 2021-06-19 DIAGNOSIS — R079 Chest pain, unspecified: Secondary | ICD-10-CM | POA: Diagnosis present

## 2021-06-19 LAB — COMPREHENSIVE METABOLIC PANEL
ALT: 14 U/L (ref 0–44)
AST: 19 U/L (ref 15–41)
Albumin: 4.2 g/dL (ref 3.5–5.0)
Alkaline Phosphatase: 43 U/L (ref 38–126)
Anion gap: 9 (ref 5–15)
BUN: 6 mg/dL (ref 6–20)
CO2: 21 mmol/L — ABNORMAL LOW (ref 22–32)
Calcium: 9.8 mg/dL (ref 8.9–10.3)
Chloride: 109 mmol/L (ref 98–111)
Creatinine, Ser: 0.76 mg/dL (ref 0.44–1.00)
GFR, Estimated: >60 mL/min (ref >60)
Glucose, Bld: 92 mg/dL (ref 70–99)
Potassium: 3.8 mmol/L (ref 3.5–5.1)
Sodium: 139 mmol/L (ref 135–145)
Total Bilirubin: 0.7 mg/dL (ref 0.3–1.2)
Total Protein: 7.5 g/dL (ref 6.5–8.1)

## 2021-06-19 LAB — CBC WITH DIFFERENTIAL/PLATELET
Abs Immature Granulocytes: 0.02 10*3/uL (ref 0.00–0.07)
Basophils Absolute: 0 10*3/uL (ref 0.0–0.1)
Basophils Relative: 0 %
Eosinophils Absolute: 0.1 10*3/uL (ref 0.0–0.5)
Eosinophils Relative: 1 %
HCT: 39.9 % (ref 36.0–46.0)
Hemoglobin: 12.8 g/dL (ref 12.0–15.0)
Immature Granulocytes: 0 %
Lymphocytes Relative: 30 %
Lymphs Abs: 1.7 10*3/uL (ref 0.7–4.0)
MCH: 28.8 pg (ref 26.0–34.0)
MCHC: 32.1 g/dL (ref 30.0–36.0)
MCV: 89.9 fL (ref 80.0–100.0)
Monocytes Absolute: 0.5 10*3/uL (ref 0.1–1.0)
Monocytes Relative: 9 %
Neutro Abs: 3.3 10*3/uL (ref 1.7–7.7)
Neutrophils Relative %: 60 %
Platelets: 287 10*3/uL (ref 150–400)
RBC: 4.44 MIL/uL (ref 3.87–5.11)
RDW: 13 % (ref 11.5–15.5)
WBC: 5.6 10*3/uL (ref 4.0–10.5)
nRBC: 0 % (ref 0.0–0.2)

## 2021-06-19 LAB — RHEUMATOID FACTOR: Rheumatoid fact SerPl-aCnc: <14 IU/mL (ref <14)

## 2021-06-19 LAB — TROPONIN I (HIGH SENSITIVITY): Troponin I (High Sensitivity): <2 ng/L (ref <18)

## 2021-06-19 LAB — AUTOIMMUNE PROFILE
Anti Nuclear Antibody (ANA): NEGATIVE
Complement C3, Serum: 126 mg/dL (ref 82–167)

## 2021-06-19 LAB — I-STAT BETA HCG BLOOD, ED (MC, WL, AP ONLY): I-stat hCG, quantitative: <5 m[IU]/mL (ref <5)

## 2021-06-19 LAB — EXTRACTABLE NUCLEAR ANTIGEN ANTIBODY
ENA RNP Ab: 0.4 AI (ref 0.0–0.9)
ENA SM Ab Ser-aCnc: <0.2 AI (ref 0.0–0.9)
ENA SSA (RO) Ab: <0.2 AI (ref 0.0–0.9)
ENA SSB (LA) Ab: <0.2 AI (ref 0.0–0.9)
Scleroderma (Scl-70) (ENA) Antibody, IgG: <0.2 AI (ref 0.0–0.9)
dsDNA Ab: <1 IU/mL (ref 0–9)

## 2021-06-19 LAB — JO-1 ANTIBODY-IGG: Jo-1 Autoabs: <1 AI

## 2021-06-19 NOTE — ED Triage Notes (Signed)
Pt states that she woke up today with SOB and pain on the L side of her chest with radiation to L arm, pt states that she is also having L leg numbness, LSN at 5pm yesterday.  ?

## 2021-06-19 NOTE — ED Provider Triage Note (Signed)
Emergency Medicine Provider Triage Evaluation Note ? ?Kristina Blair , a 26 y.o. female  was evaluated in triage.  Pt complains of chest pain since yesterday. Lower anterior chest with associated dyspnea. Mentions multiple other additional complaints. Multiple recent ED visits on chart review ? ?Review of Systems  ?Positive: Chest pain, dyspnea, n/v, leg pain/paresthesias ?Negative: Hemoptysis, syncope, fever ? ?Physical Exam  ?BP 112/87 (BP Location: Right Arm)   Pulse 77   Temp 98.5 ?F (36.9 ?C) (Oral)   Resp 17   LMP 06/13/2021 (Exact Date)   SpO2 99%  ?Gen:   Awake, no distress   ?Resp:  Normal effort  ?MSK:   Moves extremities without difficulty. Anterior chest wall TTP.  ? ?Medical Decision Making  ?Medically screening exam initiated at 5:52 AM.  Appropriate orders placed.  Allex Madia was informed that the remainder of the evaluation will be completed by another provider, this initial triage assessment does not replace that evaluation, and the importance of remaining in the ED until their evaluation is complete. ? ?Chest pain.  ?  Cherly Anderson, PA-C ?06/19/21 [id number] ? ?

## 2021-06-19 NOTE — ED Notes (Signed)
Patient left on own accord °

## 2021-06-22 ENCOUNTER — Ambulatory Visit (INDEPENDENT_AMBULATORY_CARE_PROVIDER_SITE_OTHER): Payer: No Typology Code available for payment source | Admitting: Nurse Practitioner

## 2021-06-22 ENCOUNTER — Other Ambulatory Visit (HOSPITAL_BASED_OUTPATIENT_CLINIC_OR_DEPARTMENT_OTHER): Payer: Self-pay | Admitting: *Deleted

## 2021-06-22 ENCOUNTER — Encounter: Payer: Self-pay | Admitting: Nurse Practitioner

## 2021-06-22 ENCOUNTER — Telehealth: Payer: Self-pay

## 2021-06-22 VITALS — BP 120/70 | HR 80 | Resp 16 | Ht 61.0 in | Wt 119.8 lb

## 2021-06-22 DIAGNOSIS — K30 Functional dyspepsia: Secondary | ICD-10-CM

## 2021-06-22 DIAGNOSIS — R112 Nausea with vomiting, unspecified: Secondary | ICD-10-CM | POA: Diagnosis not present

## 2021-06-22 MED ORDER — DICYCLOMINE HCL 10 MG PO CAPS: ORAL_CAPSULE | ORAL | 0 refills | Status: DC

## 2021-06-22 NOTE — Telephone Encounter (Signed)
Noted! Thank you

## 2021-06-22 NOTE — Progress Notes (Signed)
Agree with assessment and plan as outlined.  

## 2021-06-22 NOTE — Telephone Encounter (Signed)
Entry error

## 2021-06-22 NOTE — Patient Instructions (Addendum)
You will be contacted by [facility] Scheduling (Your caller ID will indicate phone # [phone]) in the next 7 days to schedule your HIDAScan. If you have not heard from them within 7 business days, please call [facility] Scheduling at [phone] to follow up on the status of your appointment.   ? ?We have sent the following medication to your pharmacy for you to pick up at your convenience: ? ?Dicyclomine 10 MG capsule, take 1 capsule thirty minutes before meals. ? ?We have given you samples of the following medication to take:  ?FDgard capsule, take 1 twice a day as needed. ? ?Stop marijuana use. ? ?We have scheduled you a follow up with Dr. Adela Lank on 08/03/21 at 11:00am. ? ?Thank you for trusting me with your gastrointestinal care!   ? ?Arnaldo Natal, CRNP ? ? ? ?BMI: ? ?If you are age 19 or older, your body mass index should be between 23-30. Your Body mass index is 22.64 kg/m?Marland Kitchen If this is out of the aforementioned range listed, please consider follow up with your Primary Care Provider. ? ?If you are age 40 or younger, your body mass index should be between 19-25. Your Body mass index is 22.64 kg/m?Marland Kitchen If this is out of the aformentioned range listed, please consider follow up with your Primary Care Provider.  ? ?MY CHART: ? ?The [facility] [place] [facility] providers would like to encourage you to use [facility] to communicate with providers for non-urgent requests or questions.  Due to long hold times on the telephone, sending your provider a message by [facility] may be a faster and more efficient way to get a response.  Please allow 48 business hours for a response.  Please remember that this is for non-urgent requests.  ? ? ? ? ? ?   ?  ? ?

## 2021-06-22 NOTE — Telephone Encounter (Signed)
-----   Message from April H Pait sent at 06/22/2021  3:30 PM EDT ----- ?Regarding: FW: HIDA scan ? ?----- Message ----- ?From: Lorenza Cambridge ?Sent: 06/22/2021   3:15 PM EDT ?To: April H Pait ?Subject: RE: HIDA scan                                 ? ?06/22/21 - called patient at home.  Patient hung up ?----- Message ----- ?From: Pait, April H ?Sent: 06/22/2021   1:00 PM EDT ?To: Lorenza Cambridge ?Subject: FW: HIDA scan                                 ? ? ?----- Message ----- ?From: Darliss Ridgel I, CMA ?Sent: 06/22/2021  12:34 PM EDT ?To: April H Pait, Marylynn Pearson ?Subject: HIDA scan                                     ? ?RADIOLOGY SCHEDULING REQUEST ? ?[facility] ?Phone: [phone] ?Fax: [phone]  ? ?Imaging Ordered: HIDA  ? ?Diagnosis: nausea/vomiting.nonulcer dyspepsia ? ?Ordering Provider: Arnaldo Natal, CRNP ? ?Is a Prior Authorization needed? We are in the process of obtaining it now ? ?Is the patient Diabetic? No ? ?Does the patient have Hypertension? No ? ?Does the patient have any implanted devices or hardware? No ? ?Date of last BUN/Creat, if needed? 06/19/21 ? ?Patient Weight? 119lb ? ?Is the patient able to get on the table? Yes ? ?Has the patient been diagnosed with COVID? No ? ?Is the patient waiting on COVID testing results? No ? ?Thank you for your assistance! ?[facility] ? ? ? ? ? ? ?

## 2021-06-22 NOTE — Progress Notes (Signed)
? ? ? ?06/22/2021 ?[name] ?[id number] ?1995-09-03 ? ? ?CHIEF COMPLAINT: Nausea and vomiting  ? ?HISTORY OF PRESENT ILLNESS: Kristina Blair is a 26 year old female with a past medical history of anxiety, depression, asthma, PSVT and GERD. ? ?She was admitted to the hospital 06/02/2021 secondary to intractable nausea with vomiting x  1 to 2 weeks.  RUQ sonogram and CTAP were unrevealing.  Her symptoms improved after she received Zofran, Reglan and IV hydration.  Her diet was advanced from clear to full liquids.  A gastric emptying study was recommended and the patient elected to complete the study as an outpatient.  She was discharged home on 06/05/2021. ? ?She went back to the ED 06/06/2021 with recurrent nausea and vomiting with epigastric pain.  She received IV fluid, Reglan and Ativan and her symptoms improved.  Labs were normal.  She was prescribed Pantoprazole 40 mg daily then discharged home. ? ?She went back to the ED 06/07/2021 due to nausea and vomiting.  She was nontoxic-appearing and appeared well-hydrated.  She pulled out her IVs and states she wanted to go home.  There was some concern that she developed akathisia from Reglan.  She went home AMA. ? ?She went back to the ED 06/08/2021 she was discharged home and returned back to the ED 8 hours later with nausea and vomiting.  She reported she was unable to keep anything down.  Urine drug screen was positive for tetrahydrocannabinol.  CBC, CMP and lipase levels were normal.  Beta-hCG 5.1 down from 6.9. Her exam was benign therefore she was discharged home. ? ?She went back to the ED 06/09/2021 and she was admitted for further evaluation for suspected cannabinoid hyperemesis syndrome versus gastroparesis versus esophagitis/gastritis.   She underwent an EGD by Dr. Paulita Fujita during this admission which was normal.  A gastric emptying study was done during this admission which was normal but she was on antiemetics which renders the gastric emptying study inaccurate.  Her  clinical status improved and she was discharged home 06/14/2021 with instructions to follow-up with [facility] and to follow-up with psychiatry.  ? ?She presented to the [place] ED 06/16/2021 secondary to left lower leg pain.  A left lower extremity Doppler study was recommended but could not be completed at [facility] so she was transferred to [facility] ED.  LLE Doppler study was negative for DVT and she was discharged home. ? ?She was seen by Myrlene Broker NP with [facility] primary care on 06/18/2021.  At that time, she complained of having muscle soreness and palpitations.  CK and TSH levels were normal.  She was prescribed Marinol 2.5 mg twice daily prior to meals for nausea and to increase her appetite.  She has not yet started taking Marinol. ? ?She presented to the ED 06/19/2021 with shortness of breath, left chest pain and left leg numbness.  D-dimer 0.41. Beta Hcg < 5. She left the ED without being seen by the ED physician. ? ?She presents to our office today as referred by her Dr. [place] for further evaluation regarding nausea, vomiting , upper abdominal pain and IBS.  Note, the patient was evaluated by [facility] during her recent hospital admission.  The patient wishes to proceed with her consult with [facility] as scheduled. ? ?She initially developed fairly abrupt onset of nausea early April 2023.  A few days later, she started vomiting.  No specific food or stress triggers.  She described emesis as clear or nonbloody  bilious or undigested food she had eaten 15 minutes earlier.  No coffee-ground emesis or hematemesis.  She continues to have NV and epigastric pain anytime she eats.  She endorses losing 15 pounds over the past 2 to 3 weeks.  No NSAID use.  No alcohol use.  She reported smoking marijuana once monthly, no marijuana use for the past 2-1/2 weeks.  She is taking Zofran every 6-8 hours typically 15 minutes before she attempts to eat any food.  She  infrequently takes promethazine suppository.  She received Reglan in the ED which resulted in heightened anxiety/panic attack.  She denies having any prior history of anxiety.  Her stress level is fairly low at this time.  No past history of bulimia or anorexia.  She is passing a fairly normal brown bowel movement daily.  No rectal bleeding or black stools. ? ?She was seen by endocrinologist with [facility] due to episodes of hypoglycemia and night sweats and near syncopal episodes.  She was seen by cardiology and underwent an echo which was normal and a Zio patch which showed PVCs and PACs of less than 1%. ? ? ?  Latest Ref Rng & Units 06/19/2021  ?  6:22 AM 06/13/2021  ?  6:09 AM 06/09/2021  ?  6:32 PM  ?CBC  ?WBC 4.0 - 10.5 K/uL 5.6   5.3   6.3    ?Hemoglobin 12.0 - 15.0 g/dL 12.8   13.2   13.6    ?Hematocrit 36.0 - 46.0 % 39.9   41.2   42.4    ?Platelets 150 - 400 K/uL 287   257   332    ?  ? ?  Latest Ref Rng & Units 06/19/2021  ?  6:22 AM 06/14/2021  ?  1:12 AM 06/13/2021  ?  6:09 AM  ?CMP  ?Glucose 70 - 99 mg/dL 92   76   101    ?BUN 6 - 20 mg/dL 6   <5   <5    ?Creatinine 0.44 - 1.00 mg/dL 0.76   0.86   0.94    ?Sodium 135 - 145 mmol/L 139   138   139    ?Potassium 3.5 - 5.1 mmol/L 3.8   3.7   3.7    ?Chloride 98 - 111 mmol/L 109   110   105    ?CO2 22 - 32 mmol/L 21   22   26     ?Calcium 8.9 - 10.3 mg/dL 9.8   9.3   9.9    ?Total Protein 6.5 - 8.1 g/dL 7.5   6.6   7.7    ?Total Bilirubin 0.3 - 1.2 mg/dL 0.7   0.5   1.0    ?Alkaline Phos 38 - 126 U/L 43   45   51    ?AST 15 - 41 U/L 19   19   23     ?ALT 0 - 44 U/L 14   12   13     ? ?RUQ sonogram 06/02/2021: ?Gallbladder:No gallstones or wall thickening visualized. No sonographic Percell Miller ?sign noted by sonographer. ?  ?Common bile duct:Diameter: 0.3 cm ?  ?Liver:No focal lesion identified. Within normal limits in parenchymal ?echogenicity. Portal vein is patent on color Doppler imaging with ?normal direction of blood flow towards the liver. ?   ?IMPRESSION: ?Normal  sonographic evaluation of the right upper abdomen. ? ?CTAP with contrast 06/02/2021: ?Lower chest: Included lung bases are clear.  Heart size is normal. ?  ?Hepatobiliary: No focal  liver abnormality is seen. No gallstones, ?gallbladder wall thickening, or biliary dilatation. ?  ?Pancreas: Unremarkable. No pancreatic ductal dilatation or ?surrounding inflammatory changes. ?  ?Spleen: Normal in size without focal abnormality. ?  ?Adrenals/Urinary Tract: Unremarkable adrenal glands. Kidneys enhance ?symmetrically without focal lesion, stone, or hydronephrosis. ?Ureters are nondilated. Urinary bladder appears unremarkable for the ?degree of distension. ?  ?Stomach/Bowel: Stomach is within normal limits. Appendix appears ?normal (series 4, image 63). No evidence of bowel wall thickening, ?distention, or inflammatory changes. ?  ?Vascular/Lymphatic: No significant vascular findings are present. No ?enlarged abdominal or pelvic lymph nodes. ?  ?Reproductive: Retroverted uterus containing IUD. Adnexal regions ?within normal limits for age. ?  ?Other: No free fluid. No abdominopelvic fluid collection. No ?pneumoperitoneum. No abdominal wall hernia. ?  ?Musculoskeletal: No acute or significant osseous findings. ?  ?IMPRESSION: ?No acute abdominopelvic findings. ? ?Gastric emptying study 06/11/2021 completed during her hospital admission.  ?Expected location of the stomach in the left upper quadrant. ?Ingested meal empties the stomach gradually over the course of the ?study. ?51% emptied at 1 hr ( normal >= 10%) ?  ?70% emptied at 2 hr ( normal >= 40%) ?  ?88% emptied at 3 hr ( normal >= 70%) ?  ?92% emptied at 4 hr ( normal >= 90%) ?  ?IMPRESSION: ?Normal gastric emptying study ? ?EGD 06/14/2021 by Dr. Paulita Fujita: ?- Normal esophagus. ?- Normal stomach. ?- Normal duodenal bulb, first portion of the duodenum and second portion of the ?duodenum. ?- No obvious cause nausea/vomiting identified. ? ?Echo 05/14/2021: ?Left ventricular  ejection fraction, by estimation, is 60 to 65%. The left ventricle has ?normal function. The left ventricle has no regional wall motion abnormalities. Left ?ventricular diastolic parameters were normal. ?1. ?Right ventricular

## 2021-06-24 ENCOUNTER — Emergency Department (HOSPITAL_COMMUNITY): Payer: No Typology Code available for payment source

## 2021-06-24 ENCOUNTER — Encounter (HOSPITAL_COMMUNITY): Payer: Self-pay | Admitting: Emergency Medicine

## 2021-06-24 ENCOUNTER — Emergency Department (HOSPITAL_COMMUNITY)
Admission: EM | Admit: 2021-06-24 | Discharge: 2021-06-24 | Disposition: A | Discharge status: Home / Self Care | Payer: No Typology Code available for payment source | Attending: Emergency Medicine | Admitting: Emergency Medicine

## 2021-06-24 DIAGNOSIS — R519 Headache, unspecified: Secondary | ICD-10-CM

## 2021-06-24 LAB — BASIC METABOLIC PANEL
Anion gap: 5 (ref 5–15)
BUN: 7 mg/dL (ref 6–20)
CO2: 24 mmol/L (ref 22–32)
Calcium: 9.3 mg/dL (ref 8.9–10.3)
Chloride: 112 mmol/L — ABNORMAL HIGH (ref 98–111)
Creatinine, Ser: 0.62 mg/dL (ref 0.44–1.00)
GFR, Estimated: >60 mL/min (ref >60)
Glucose, Bld: 86 mg/dL (ref 70–99)
Potassium: 3.7 mmol/L (ref 3.5–5.1)
Sodium: 141 mmol/L (ref 135–145)

## 2021-06-24 LAB — CBC WITH DIFFERENTIAL/PLATELET
Abs Immature Granulocytes: 0.02 10*3/uL (ref 0.00–0.07)
Basophils Absolute: 0 10*3/uL (ref 0.0–0.1)
Basophils Relative: 0 %
Eosinophils Absolute: 0.1 10*3/uL (ref 0.0–0.5)
Eosinophils Relative: 2 %
HCT: 42.1 % (ref 36.0–46.0)
Hemoglobin: 13.3 g/dL (ref 12.0–15.0)
Immature Granulocytes: 0 %
Lymphocytes Relative: 34 %
Lymphs Abs: 2 10*3/uL (ref 0.7–4.0)
MCH: 28.6 pg (ref 26.0–34.0)
MCHC: 31.6 g/dL (ref 30.0–36.0)
MCV: 90.5 fL (ref 80.0–100.0)
Monocytes Absolute: 0.5 10*3/uL (ref 0.1–1.0)
Monocytes Relative: 9 %
Neutro Abs: 3.3 10*3/uL (ref 1.7–7.7)
Neutrophils Relative %: 55 %
Platelets: 311 10*3/uL (ref 150–400)
RBC: 4.65 MIL/uL (ref 3.87–5.11)
RDW: 13.1 % (ref 11.5–15.5)
WBC: 5.9 10*3/uL (ref 4.0–10.5)
nRBC: 0 % (ref 0.0–0.2)

## 2021-06-24 MED ORDER — ACETAMINOPHEN 500 MG PO TABS
1000.0000 mg | ORAL_TABLET | Freq: Once | ORAL | Status: AC
  Administered 2021-06-24: 1000 mg via ORAL
  Filled 2021-06-24: qty 2

## 2021-06-24 MED ORDER — KETOROLAC TROMETHAMINE 15 MG/ML IJ SOLN
15.0000 mg | Freq: Once | INTRAMUSCULAR | Status: AC
Start: 2021-06-24 — End: 2021-06-24
  Administered 2021-06-24: 15 mg via INTRAMUSCULAR
  Filled 2021-06-24: qty 1

## 2021-06-24 NOTE — ED Triage Notes (Addendum)
Patient reports headache with pressure in head and states R eye seems like she is wearing sunglasses. Reports ringing in right ear with dizziness. States symptoms since waking this morning. ?

## 2021-06-24 NOTE — ED Provider Notes (Signed)
?[facility]-EMERGENCY DEPT ?Provider Note ? ? ?CSN: [id number] ?Arrival date & time: 06/24/21  1554 ? ?  ? ?History ? ?Chief Complaint  ?Patient presents with  ? Headache  ? ? ?Kristina Blair is a 26 y.o. female. ? ?26 year old female with prior medical history as detailed below presents for evaluation of headache.  Patient reports gradual onset of right-sided headache this morning. ? ?She denies fever. ? ?She denies neck pain. ? ?She took ibuprofen early this morning with some improvement in her symptoms. ? ?The history is provided by the patient and medical records.  ?Headache ?Pain location:  R temporal ?Quality:  Dull ?Radiates to:  Does not radiate ?Onset quality:  Gradual ?Duration:  12 hours ?Timing:  Rare ?Progression:  Waxing and waning ?Chronicity:  New ? ?  ? ?Home Medications ?Prior to Admission medications   ?Medication Sig Start Date End Date Taking? Authorizing Provider  ?ASHWAGANDHA PO Take 1 capsule by mouth daily with breakfast.    [provider]  ?dicyclomine (BENTYL) 10 MG capsule Take 30 minutes before meals. 06/22/21   Arnaldo Natal, NP  ?dronabinol (MARINOL) 2.5 MG capsule Take 1 capsule (2.5 mg total) by mouth 2 (two) times daily before a meal. 06/18/21   Dulce Sellar, NP  ?hydrOXYzine (ATARAX) 25 MG tablet Take 1 tablet (25 mg total) by mouth every 6 (six) hours as needed for anxiety. 06/06/21   Tilden Fossa, MD  ?indomethacin (INDOCIN) 25 MG capsule Take 25 mg by mouth 3 (three) times daily. 06/21/21   [provider]  ?MAGNESIUM PO Take 1 tablet by mouth daily as needed (for leg cramps).    [provider]  ?methocarbamol (ROBAXIN) 500 MG tablet Take 500 mg by mouth 2 (two) times daily. 06/21/21   [provider]  ?Multiple Vitamins-Calcium (ONE-A-DAY WOMENS FORMULA) TABS Take 1 tablet by mouth daily with breakfast.    [provider]  ?Nutritional Supplements (,FEEDING SUPPLEMENT, PROSOURCE PLUS) liquid Take 30  mLs by mouth 3 (three) times daily between meals. ?Patient not taking: Reported on 06/22/2021 06/14/21   Burnadette Pop, MD  ?ondansetron (ZOFRAN-ODT) 4 MG disintegrating tablet Take 1 tablet (4 mg total) by mouth every 8 (eight) hours as needed for nausea or vomiting. 06/18/21   Dulce Sellar, NP  ?pantoprazole (PROTONIX) 40 MG tablet Take 1 tablet (40 mg total) by mouth daily. ?Patient not taking: Reported on 06/22/2021 06/05/21 06/05/22  Cipriano Bunker, MD  ?propranolol (INDERAL) 10 MG tablet Take 1 tablet (10 mg total) by mouth 3 (three) times daily as needed. If BP <95/60, drop dose to 1/2 pill tid prn ?Patient not taking: Reported on 06/22/2021 06/18/21   Dulce Sellar, NP  ?tizanidine (ZANAFLEX) 2 MG capsule Take by mouth. 06/17/21   [provider]  ?   ? ?Allergies    ?Kiwi extract and Reglan [metoclopramide]   ? ?Review of Systems   ?Review of Systems  ?Neurological:  Positive for headaches.  ?All other systems reviewed and are negative. ? ?Physical Exam ?Updated Vital Signs ?BP 122/81 (BP Location: Right Arm)   Pulse 87   Temp 98.4 ?F (36.9 ?C)   Resp 18   LMP 06/13/2021 (Exact Date)   SpO2 100%  ?Physical Exam ?Vitals and nursing note reviewed.  ?Constitutional:   ?   General: She is not in acute distress. ?   Appearance: Normal appearance. She is well-developed.  ?HENT:  ?   Head: Normocephalic and atraumatic.  ?Eyes:  ?  General: No visual field deficit. ?   Conjunctiva/sclera: Conjunctivae normal.  ?   Pupils: Pupils are equal, round, and reactive to light.  ?Cardiovascular:  ?   Rate and Rhythm: Normal rate and regular rhythm.  ?   Heart sounds: Normal heart sounds.  ?Pulmonary:  ?   Effort: Pulmonary effort is normal. No respiratory distress.  ?   Breath sounds: Normal breath sounds.  ?Abdominal:  ?   General: There is no distension.  ?   Palpations: Abdomen is soft.  ?   Tenderness: There is no abdominal tenderness.  ?Musculoskeletal:     ?   General: No deformity. Normal range of  motion.  ?   Cervical back: Normal range of motion and neck supple.  ?Skin: ?   General: Skin is warm and dry.  ?Neurological:  ?   General: No focal deficit present.  ?   Mental Status: She is alert and oriented to person, place, and time.  ?   GCS: GCS eye subscore is 4. GCS verbal subscore is 5. GCS motor subscore is 6.  ?   Cranial Nerves: No cranial nerve deficit, dysarthria or facial asymmetry.  ?   Sensory: No sensory deficit.  ?   Motor: No weakness.  ?   Coordination: Romberg sign negative. Coordination normal.  ? ? ?ED Results / Procedures / Treatments   ?Labs ?(all labs ordered are listed, but only abnormal results are displayed) ?Labs Reviewed  ?BASIC METABOLIC PANEL - Abnormal; Notable for the following components:  ?    Result Value  ? Chloride 112 (*)   ? All other components within normal limits  ?CBC WITH DIFFERENTIAL/PLATELET  ? ? ?EKG ?None ? ?Radiology ?CT Head Wo Contrast ? ?Result Date: 06/24/2021 ?CLINICAL DATA:  Headache. EXAM: CT HEAD WITHOUT CONTRAST TECHNIQUE: Contiguous axial images were obtained from the base of the skull through the vertex without intravenous contrast. RADIATION DOSE REDUCTION: This exam was performed according to the departmental dose-optimization program which includes automated exposure control, adjustment of the mA and/or kV according to patient size and/or use of iterative reconstruction technique. COMPARISON:  May 31, 2021 FINDINGS: Brain: No evidence of acute infarction, hemorrhage, hydrocephalus, extra-axial collection or mass lesion/mass effect. Vascular: No hyperdense vessel or unexpected calcification. Skull: Normal. Negative for fracture or focal lesion. Sinuses/Orbits: No acute finding. Other: None. IMPRESSION: No acute intracranial abnormality. Electronically Signed   By: Ted Mcalpine M.D.   On: 06/24/2021 17:15   ? ?Procedures ?Procedures  ? ? ?Medications Ordered in ED ?Medications  ?acetaminophen (TYLENOL) tablet 1,000 mg (has no administration  in time range)  ?ketorolac (TORADOL) 15 MG/ML injection 15 mg (has no administration in time range)  ? ? ?ED Course/ Medical Decision Making/ A&P ?  ?                        ?Medical Decision Making ?Risk ?OTC drugs. ?Prescription drug management. ? ? ? ?Medical Screen Complete ? ?This patient presented to the ED with complaint of headache. ? ?This complaint involves an extensive number of treatment options. The initial differential diagnosis includes, but is not limited to, tension headache, migraine, etc. ? ?This presentation is: Acute, Self-Limited, Previously Undiagnosed, Uncertain Prognosis, and Complicated ? ?Patient is presenting with complaint of headache.  Describes symptoms are most consistent with likely migraine. ? ?Patient without evidence of significant pathology on exam. ? ?Imaging and labs obtained are without significant abnormality.  Patient feels significant improved  after administration of treatment here in the ED. ? ?Patient does understand need for close outpatient follow-up.  Strict return precautions given and understood. ? ?Additional history obtained: ? ?External records from outside sources obtained and reviewed including prior ED visits and prior Inpatient records.  ? ? ?Lab Tests: ? ?I ordered and personally interpreted labs.  The pertinent results include: CBC, BMP ? ? ?Imaging Studies ordered: ? ?I ordered imaging studies including CT head ?I independently visualized and interpreted obtained imaging which showed NAD ?I agree with the radiologist interpretation. ? ? ?Medicines ordered: ? ?I ordered medication including Tylenol and Toradol for headache ?Reevaluation of the patient after these medicines showed that the patient: improved ? ?Problem List / ED Course: ? ?Headache ? ? ?Reevaluation: ? ?After the interventions noted above, I reevaluated the patient and found that they have: improved ? ? ?Disposition: ? ?After consideration of the diagnostic results and the patients response to  treatment, I feel that the patent would benefit from close outpatient follow-up.  ? ? ? ? ? ? ? ? ?Final Clinical Impression(s) / ED Diagnoses ?Final diagnoses:  ?Nonintractable headache, unspecified chronici

## 2021-06-24 NOTE — ED Provider Triage Note (Signed)
Emergency Medicine Provider Triage Evaluation Note ? ?Kristina Blair , a 26 y.o. female  was evaluated in triage.  Pt complains of severe headache started this morning when she woke up.  Patient does have a history of migraines as a child but has not had a headache in several years.  She is also complaining of visual deficits in the right eye which she describes as "wearing sunglasses."  She denies any focal weakness or numbness. ? ?Review of Systems  ?Positive:  ?Negative: See above  ? ?Physical Exam  ?BP (!) 146/86   Pulse 91   Temp 98.4 ?F (36.9 ?C)   Resp 17   LMP 06/13/2021 (Exact Date)   SpO2 100%  ?Gen:   Awake, no distress   ?Resp:  Normal effort  ?MSK:   Moves extremities without difficulty  ?Other:  Cranial nerves II through XII are intact.  Pupils are equal round and reactive to light.  No pronator drift.  Normal sensation to all 4 extremities.  5/5 strength to the upper and lower extremities.  Normal extraocular movements.  Normal test of skew.  Head impulse test negative. ? ?Medical Decision Making  ?Medically screening exam initiated at 4:17 PM.  Appropriate orders placed.  Tailey Top was informed that the remainder of the evaluation will be completed by another provider, this initial triage assessment does not replace that evaluation, and the importance of remaining in the ED until their evaluation is complete. ? ? ?  ?Honor Loh [place], PA-C ?06/24/21 1618 ? ?

## 2021-06-24 NOTE — Discharge Instructions (Signed)
Return for any problem.  ?

## 2021-06-24 NOTE — Progress Notes (Signed)
Hi Kristina Blair, ? ?All of your autoimmune panel results are negative. The sedimentation rate is slightly elevated, but not worrisome, as this can fluctuate daily. ?It appears that Dr. de [place] referred you to Rheumatology already on 3/31 - Dr Casimer Lanius - did you hear from them?

## 2021-06-25 ENCOUNTER — Encounter: Payer: Self-pay | Admitting: Family

## 2021-06-26 ENCOUNTER — Ambulatory Visit: Payer: No Typology Code available for payment source | Admitting: Family

## 2021-06-26 ENCOUNTER — Other Ambulatory Visit: Payer: Self-pay

## 2021-06-26 DIAGNOSIS — R5383 Other fatigue: Secondary | ICD-10-CM

## 2021-06-26 DIAGNOSIS — R55 Syncope and collapse: Secondary | ICD-10-CM

## 2021-06-26 NOTE — Telephone Encounter (Signed)
Referral resent to Rheumatology at [facility]. ?

## 2021-06-28 ENCOUNTER — Encounter: Payer: Self-pay | Admitting: Family

## 2021-06-28 ENCOUNTER — Ambulatory Visit (INDEPENDENT_AMBULATORY_CARE_PROVIDER_SITE_OTHER): Payer: No Typology Code available for payment source | Admitting: Family

## 2021-06-28 VITALS — BP 110/72 | HR 85 | Temp 98.8°F | Ht 64.0 in | Wt 120.5 lb

## 2021-06-28 DIAGNOSIS — R634 Abnormal weight loss: Secondary | ICD-10-CM

## 2021-06-28 DIAGNOSIS — F411 Generalized anxiety disorder: Secondary | ICD-10-CM | POA: Diagnosis not present

## 2021-06-28 DIAGNOSIS — F32A Depression, unspecified: Secondary | ICD-10-CM

## 2021-06-28 DIAGNOSIS — F419 Anxiety disorder, unspecified: Secondary | ICD-10-CM | POA: Diagnosis not present

## 2021-06-28 DIAGNOSIS — G4452 New daily persistent headache (NDPH): Secondary | ICD-10-CM

## 2021-06-28 DIAGNOSIS — L732 Hidradenitis suppurativa: Secondary | ICD-10-CM | POA: Diagnosis not present

## 2021-06-28 DIAGNOSIS — R630 Anorexia: Secondary | ICD-10-CM

## 2021-06-28 HISTORY — DX: Generalized anxiety disorder: F41.1

## 2021-06-28 MED ORDER — HYDROXYZINE HCL 10 MG PO TABS: 10.0000 mg | ORAL_TABLET | Freq: Three times a day (TID) | ORAL | 0 refills | Status: DC | PRN

## 2021-06-28 NOTE — Patient Instructions (Addendum)
It was very nice to see you today! ? ?Use Hibiclens soap and loufah sponge or washcloth to gently scrub under your left arm twice a day. Also use a warm moist compress several times per day. Let me know if this is not getting better. ? ?I am sending generic Effexor to help with your anxiety, take this every day, and it can take up to 2 weeks to start working. Any side effects should go away within a week, but let me know if any are not tolerable.  ?I have also sent a lower dose of your Hydroxyzine to take as needed for your anxiety that will be less drowsy inducing, and you have the Propranolol also to take as needed for anxiety, racing heart, and palpitations. ?Keep your appointment for therapy with [facility]. ? ?I am sending Naproxen to take twice a day as needed for your headaches, and then taper off to daily for a week and then as needed. Take this for the next week twice a day until the Effexor can kick in and hopefully help prevent the headaches. Propranolol also can help with preventing headaches/migraines.  ?OK to take extra strength Tylenol as needed with the Naproxen I am giving you, but do not take extra Ibuprofen, Advil, Motrin, Aleve etc. ? ?Schedule a 1 month follow up visit. ? ? ? ? ?PLEASE NOTE: ? ?If you had any lab tests please let us know if you have not heard back within a few days. You may see your results on MyChart before we have a chance to review them but we will give you a call once they are reviewed by [place]. If we ordered any referrals today, please let us know if you have not heard from their office within the next week.  ? ?Please try these tips to maintain a healthy lifestyle: ? ?Eat most of your calories during the day when you are active. Eliminate processed foods including packaged sweets (pies, cakes, cookies), reduce intake of potatoes, white bread, white pasta, and white rice. Look for whole grain options, oat flour or almond flour. ? ?Each meal should contain half  fruits/vegetables, one quarter protein, and one quarter carbs (no bigger than a computer mouse). ? ?Cut down on sweet beverages. This includes juice, soda, and sweet tea. Also watch fruit intake, though this is a healthier sweet option, it still contains natural sugar! Limit to 3 servings daily. ? ?Drink at least 1 glass of water with each meal and aim for at least 8 glasses per day ? ?Exercise at least 150 minutes every week.  ? ?

## 2021-06-28 NOTE — Progress Notes (Signed)
? ?Subjective:  ? ? ? Patient ID: Kristina Blair, female    DOB: 03-Jun-1995, 26 y.o.   MRN: [id number] ? ?Chief Complaint  ?Patient presents with  ? Follow-up  ?  ED follow up, Pt was seen in the ED on 4/23 for Migraine, Bump under left arm pit and anxiety. Was given a shot, benadryl and tylenol. Told pt to follow up with PCP.   ? ?HPI: ?Left axilla cyst:  pt reports first time having, small, tender, not draining, no erythema. ?Headaches:  reports starting behind her eyes & radiates to top & left side of head, daily, wakes up with it and it continues all day. Feels an adrenaline rush and heart races prior to HA. pt reports also having increased anxiety. Seen for both issues in ER 2 days ago, given Toradol shot which she says helped, but HA has returned. ?Anxiety/Depression: Patient complains of anxiety disorder, panic attacks, and depression .   ?She has the following symptoms: difficulty concentrating, feelings of losing control, irritable, palpitations, racing thoughts, shortness of breath, sweating.  ?Onset of symptoms was approximately  months ago, She denies current suicidal and homicidal ideation.  ?Possible organic causes contributing are: none.  ?Risk factors:  increased anxiety.   ?Previous treatment includes  no meds or therapy. Pt reports she has an appointment scheduled with [facility] in May to start therapy.    ? ?  06/28/2021  ? 10:05 AM  ?Depression screen PHQ 2/9  ?Decreased Interest 2  ?Down, Depressed, Hopeless 2  ?PHQ - 2 Score 4  ?Altered sleeping 3  ?Tired, decreased energy 3  ?Change in appetite 3  ?Feeling bad or failure about yourself  1  ?Trouble concentrating 2  ?Moving slowly or fidgety/restless 1  ?Suicidal thoughts 0  ?PHQ-9 Score 17  ?Difficult doing work/chores Extremely dIfficult  ? ? ?  06/28/2021  ? 10:05 AM  ?GAD 7 : Generalized Anxiety Score  ?Nervous, Anxious, on Edge 3  ?Control/stop worrying 3  ?Worry too much - different things 3  ?Trouble relaxing 3  ?Restless 3  ?Easily annoyed or  irritable 3  ?Afraid - awful might happen 3  ?Total GAD 7 Score 21  ?Anxiety Difficulty Extremely difficult  ? ? ?Assessment & Plan:  ? ?Problem List Items Addressed This Visit   ? ?  ? Other  ? Anxiety and depression  ?  Unstable- pt last visit reported having anxiety last fall, but since her GM came to live with her she was better. Therapy referral was ordered, she has appt next month with [facility]. She has been in the ER multiple times for a variety of complaints and all testing has been negative. Seen in ER again 2 days ago and told she needs tx. Higher dose Vistaril makes her drowsy, sending lower dose, continue Propranolol prn, also starting Effexor, hoping to help with persistent HA as well. Advised on use & SE, f/u in 1 mo. ? ?  ?  ? Relevant Medications  ? hydrOXYzine (ATARAX) 10 MG tablet  ? venlafaxine XR (EFFEXOR XR) 37.5 MG 24 hr capsule  ? Abnormal weight loss  ?  tolerating Marinol, has gained 2 lbs, continue same dose. ? ?  ?  ? Relevant Medications  ? dronabinol (MARINOL) 2.5 MG capsule  ? New daily persistent headache  ?  New - believe related to her anxiety, seen in ER 2 days ago and given Toradol shot, benadryl. States HA resolved then returned. Starting Effexor for anxiety & HA  prophylactic, advised pt she also has Propranolol and can take 2-3x/day as prophylactic & sending Naproxen bid for short term acute pain.  ? ?  ?  ? Relevant Medications  ? venlafaxine XR (EFFEXOR XR) 37.5 MG 24 hr capsule  ?   ?   ?   ?   ? ?Other Visit Diagnoses   ? ? Hidradenitis axillaris    ?approx. 1.5cm diameter, left axilla, advised to wash with Hibclens soap and loufah sponge twice a day. If cyst does not resolve or worsens, let me know. ?   ? Decreased appetite      ? Relevant Medications  ? dronabinol (MARINOL) 2.5 MG capsule  ? ?  ? ?Outpatient Medications Prior to Visit  ?Medication Sig Dispense Refill  ? dicyclomine (BENTYL) 10 MG capsule Take 30 minutes before meals. 90 capsule 0  ? MAGNESIUM PO Take 1  tablet by mouth daily as needed (for leg cramps).    ? Multiple Vitamins-Calcium (ONE-A-DAY WOMENS FORMULA) TABS Take 1 tablet by mouth daily with breakfast.    ? Nutritional Supplements (,FEEDING SUPPLEMENT, PROSOURCE PLUS) liquid Take 30 mLs by mouth 3 (three) times daily between meals. 887 mL 1  ? ondansetron (ZOFRAN-ODT) 4 MG disintegrating tablet Take 1 tablet (4 mg total) by mouth every 8 (eight) hours as needed for nausea or vomiting. 30 tablet 0  ? ASHWAGANDHA PO Take 1 capsule by mouth daily with breakfast.    ? dronabinol (MARINOL) 2.5 MG capsule Take 1 capsule (2.5 mg total) by mouth 2 (two) times daily before a meal. 60 capsule 0  ? hydrOXYzine (ATARAX) 25 MG tablet Take 1 tablet (25 mg total) by mouth every 6 (six) hours as needed for anxiety. 20 tablet 0  ? indomethacin (INDOCIN) 25 MG capsule Take 25 mg by mouth 3 (three) times daily.    ? methocarbamol (ROBAXIN) 500 MG tablet Take 500 mg by mouth 2 (two) times daily.    ? pantoprazole (PROTONIX) 40 MG tablet Take 1 tablet (40 mg total) by mouth daily. 30 tablet 1  ? propranolol (INDERAL) 10 MG tablet Take 1 tablet (10 mg total) by mouth 3 (three) times daily as needed. If BP <95/60, drop dose to 1/2 pill tid prn 30 tablet 1  ? tizanidine (ZANAFLEX) 2 MG capsule Take by mouth.    ? ?No facility-administered medications prior to visit.  ? ? ?Past Medical History:  ?Diagnosis Date  ? Allergy   ? ASCUS of cervix with negative high risk HPV   ? Asthma   ? [employer] 02/13/2020  ? Generalized anxiety disorder 06/28/2021  ? GERD (gastroesophageal reflux disease)   ? [employer] 02/13/2020  ? IUD (intrauterine device) in place 09/2019  ? liletta  ? Paroxysmal SVT (supraventricular tachycardia) (HCC)   ? ? ?Past Surgical History:  ?Procedure Laterality Date  ? ESOPHAGOGASTRODUODENOSCOPY (EGD) WITH PROPOFOL N/A 06/14/2021  ? Procedure: ESOPHAGOGASTRODUODENOSCOPY (EGD) WITH PROPOFOL;  Surgeon: Willis Modena, MD;  Location: [facility] ENDOSCOPY;  Service: Gastroenterology;   Laterality: N/A;  ? WISDOM TOOTH EXTRACTION    ? ? ?Allergies  ?Allergen Reactions  ? Kiwi Extract Itching and Other (See Comments)  ?  Tongue itching  ? Reglan [Metoclopramide] Anxiety  ? ? ?   ?Objective:  ?  ?Physical Exam ?Vitals and nursing note reviewed.  ?Constitutional:   ?   Appearance: Normal appearance.  ?Cardiovascular:  ?   Rate and Rhythm: Normal rate and regular rhythm.  ?Pulmonary:  ?   Effort: Pulmonary effort  is normal.  ?   Breath sounds: Normal breath sounds.  ?Musculoskeletal:     ?   General: Normal range of motion.  ?Skin: ?   General: Skin is warm and dry.  ?   Findings: Abscess (left axilla, approx. 1.5cm in diameter) present.  ?Neurological:  ?   Mental Status: She is alert.  ?Psychiatric:     ?   Mood and Affect: Mood normal.     ?   Behavior: Behavior normal.  ? ? ?BP 110/72 (BP Location: Left Arm, Patient Position: Sitting, Cuff Size: Large)   Pulse 85   Temp 98.8 ?F (37.1 ?C) (Temporal)   Ht 5\' 4"  (1.626 m)   Wt 120 lb 8 oz (54.7 kg)   LMP 06/13/2021 (Exact Date)   SpO2 99%   BMI 20.68 kg/m?  ?Wt Readings from Last 3 Encounters:  ?06/28/21 120 lb 8 oz (54.7 kg)  ?06/22/21 119 lb 12.8 oz (54.3 kg)  ?06/18/21 118 lb 4 oz (53.6 kg)  ? ?   ? ?Dulce SellarHudnell, Jadd Gasior, NP ? ?

## 2021-07-01 ENCOUNTER — Encounter: Payer: Self-pay | Admitting: Family

## 2021-07-01 MED ORDER — DRONABINOL 2.5 MG PO CAPS: 2.5000 mg | ORAL_CAPSULE | Freq: Two times a day (BID) | ORAL | 2 refills | Status: DC

## 2021-07-01 MED ORDER — VENLAFAXINE HCL ER 37.5 MG PO CP24: 37.5000 mg | ORAL_CAPSULE | Freq: Every day | ORAL | 0 refills | Status: DC

## 2021-07-01 NOTE — Assessment & Plan Note (Addendum)
Unstable- pt last visit reported having anxiety last fall, but since her GM came to live with her she was better. Therapy referral was ordered, she has appt next month with [facility]. She has been in the ER multiple times for a variety of complaints and all testing has been negative. Seen in ER again 2 days ago and told she needs tx. Higher dose Vistaril makes her drowsy, sending lower dose, continue Propranolol prn, also starting Effexor, hoping to help with persistent HA as well. Advised on use & SE, f/u in 1 mo. ?

## 2021-07-01 NOTE — Assessment & Plan Note (Signed)
tolerating Marinol, has gained 2 lbs, continue same dose. ?

## 2021-07-01 NOTE — Assessment & Plan Note (Signed)
New - believe related to her anxiety, seen in ER 2 days ago and given Toradol shot, benadryl. States HA resolved then returned. Starting Effexor for anxiety & HA prophylactic, advised pt she also has Propranolol and can take 2-3x/day as prophylactic & sending Naproxen bid for short term acute pain.  ?

## 2021-07-09 ENCOUNTER — Telehealth: Payer: Self-pay | Admitting: Neurology

## 2021-07-09 ENCOUNTER — Ambulatory Visit: Payer: No Typology Code available for payment source | Admitting: Neurology

## 2021-07-09 NOTE — Telephone Encounter (Signed)
Canceled 8:15 with Dr. April Manson due to transportation issues.  ?

## 2021-07-10 ENCOUNTER — Ambulatory Visit (INDEPENDENT_AMBULATORY_CARE_PROVIDER_SITE_OTHER): Payer: No Typology Code available for payment source | Admitting: Adult Health

## 2021-07-10 ENCOUNTER — Encounter: Payer: Self-pay | Admitting: Adult Health

## 2021-07-10 VITALS — BP 116/79 | HR 95 | Ht 61.0 in | Wt 122.6 lb

## 2021-07-10 DIAGNOSIS — F99 Mental disorder, not otherwise specified: Secondary | ICD-10-CM

## 2021-07-10 DIAGNOSIS — F5105 Insomnia due to other mental disorder: Secondary | ICD-10-CM

## 2021-07-10 DIAGNOSIS — F418 Other specified anxiety disorders: Secondary | ICD-10-CM | POA: Diagnosis not present

## 2021-07-10 DIAGNOSIS — F422 Mixed obsessional thoughts and acts: Secondary | ICD-10-CM | POA: Diagnosis not present

## 2021-07-10 DIAGNOSIS — F320 Major depressive disorder, single episode, mild: Secondary | ICD-10-CM

## 2021-07-10 MED ORDER — SERTRALINE HCL 50 MG PO TABS: 50.0000 mg | ORAL_TABLET | Freq: Every day | ORAL | 2 refills | Status: DC

## 2021-07-10 MED ORDER — CLONAZEPAM 0.5 MG PO TABS: 0.5000 mg | ORAL_TABLET | Freq: Every day | ORAL | 2 refills | Status: DC

## 2021-07-10 NOTE — Progress Notes (Signed)
[facility] MD/PA/NP Initial Note ? ?07/10/2021 10:50 AM ?Kristina Blair  ?MRN:  [id number] ? ?Chief Complaint:  ?  ? ?HPI:  ? ?Referred by PCP  ? ?Describes mood today as "not the best". Pleasant. Denies tearfulness. Mood symptoms - depression - not in school - friends graduating - no energy and stuck at home. Feels irritable in general, but nothing specific. Feels anxious all the time. Reports panic attacks - "I had a bad one in March - I thought I was dying". 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 Reports obsessive thoughts. Reports worry and rumination. Reports irrational thoughts - a lot. Feels like she has every illness possible. Reports multiple visits to the ED - "most of the time everything comes back normal". Gets overwhelmed going to the grocery store. Has to sit in the parking lot for a while before she can go in. Often leaves her cart in the store. Getting overwhelmed being around people. 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 Stable interest and motivation. Taking medications as prescribed.  ?Energy levels lower. Active, does not have a regular exercise routine.   ?Enjoys some usual interests and activities. Single. Lives with grandmother and 1 dog. Spending time with family and friends. ?Appetite varies. Weight loss - having a difficult time eating - feels nauseas from the anxiety. ?Sleeps better some nights than others. Averages 5 to 6 hours. Mind racing. Has health anxiety - mother sick and that started.  ?Focus and concentration stable. Completing tasks. Managing aspects of household. Works full time for [employer] - remote - going into the office once a month. ?Denies SI or HI.  ?Denies AH or VH. ?Denies substance use. ? ?Previous medication trials: Zoloft, Prozac, Lamictal, Wellbutrin - helpful, various other medication ? ?Visit Diagnosis:  ?  ICD-10-CM   ?1. Anxiety about health  F41.8 sertraline (ZOLOFT) 50 MG tablet  ?   clonazePAM (KLONOPIN) 0.5 MG tablet  ?  ?2. Insomnia due to other mental disorder  F51.05 clonazePAM (KLONOPIN) 0.5 MG tablet  ? F99   ?  ?3. Mixed obsessional thoughts and acts  F42.2 sertraline (ZOLOFT) 50 MG tablet  ?  ?4. Current mild episode of major depressive disorder without prior episode (HCC)  F32.0 sertraline (ZOLOFT) 50 MG tablet  ?  ? ? ?Past Psychiatric History: Reports psychiatric hospitalization in 2019 - 72 hour hold because she said she would not use the suicide hotline if she was suicidal. ? ?Past Medical History:  ?Past Medical History:  ?Diagnosis Date  ? Allergy   ? ASCUS of cervix with negative high risk HPV   ? Asthma   ? Phreesia 02/13/2020  ? Generalized anxiety disorder 06/28/2021  ? GERD (gastroesophageal reflux disease)   ? Phreesia 02/13/2020  ? IUD (intrauterine device) in place 09/2019  ? liletta  ? Paroxysmal SVT (supraventricular tachycardia) (HCC)   ?  ?Past Surgical History:  ?Procedure Laterality Date  ? ESOPHAGOGASTRODUODENOSCOPY (EGD) WITH PROPOFOL N/A 06/14/2021  ? Procedure: ESOPHAGOGASTRODUODENOSCOPY (EGD) WITH PROPOFOL;  Surgeon: Arta Silence, MD;  Location: [facility];  Service: Gastroenterology;  Laterality: N/A;  ? WISDOM TOOTH EXTRACTION    ? ? ?Family Psychiatric History: Family history of mental illness - father and brother with Borderline PD. Noting addiction runs on both sides of the family. ? ?Family History:  ?Family History  ?Problem Relation Age of Onset  ? Diabetes Mother   ? Hypertension Mother   ?  Breast cancer Mother 65  ?     recurrence age 69  ? Early death Mother   ? Hyperlipidemia Mother   ? Miscarriages / [place] Mother   ? Obesity Mother   ? Alcohol abuse Mother   ? Cancer Mother   ? Bipolar disorder Father   ? Cerebral palsy Brother   ? Epilepsy Brother   ? ADD / ADHD Brother   ? Cancer Maternal Aunt   ?     uterine cancer, passed age 63  ? Asthma Maternal Aunt   ? Diabetes Maternal Grandmother   ? Heart disease Maternal Grandmother   ?  Hyperlipidemia Maternal Grandmother   ? Hypertension Maternal Grandmother   ? Arthritis Maternal Grandmother   ? COPD Maternal Grandmother   ? Hearing loss Maternal Grandmother   ? Cancer Maternal Grandfather   ?     leukemia  ? Early death Maternal Grandfather   ? Colon cancer Paternal Grandfather   ? Ovarian cancer Maternal Great-grandmother   ?     passed aroung age 41  ? Esophageal cancer Neg Hx   ? Pancreatic cancer Neg Hx   ? Rectal cancer Neg Hx   ? Stomach cancer Neg Hx   ? Liver cancer Neg Hx   ? ? ?Social History:  ?Social History  ? ?Socioeconomic History  ? Marital status: Single  ?  Spouse name: Not on file  ? Number of children: Not on file  ? Years of education: Not on file  ? Highest education level: Not on file  ?Occupational History  ? Not on file  ?Tobacco Use  ? Smoking status: Never  ? Smokeless tobacco: Never  ?Vaping Use  ? Vaping Use: Never used  ?Substance and Sexual Activity  ? Alcohol use: Never  ? Drug use: Never  ? Sexual activity: Not Currently  ?  Birth control/protection: I.U.D.  ?Other Topics Concern  ? Not on file  ?Social History Narrative  ? Not on file  ? ?Social Determinants of Health  ? ?Financial Resource Strain: Not on file  ?Food Insecurity: Not on file  ?Transportation Needs: Not on file  ?Physical Activity: Not on file  ?Stress: Not on file  ?Social Connections: Not on file  ? ? ?Allergies:  ?Allergies  ?Allergen Reactions  ? Kiwi Extract Itching and Other (See Comments)  ?  Tongue itching  ? Reglan [Metoclopramide] Anxiety  ? ? ?Metabolic Disorder Labs: ?Lab Results  ?Component Value Date  ? HGBA1C 5.2 01/17/2021  ? ?No results found for: PROLACTIN ?Lab Results  ?Component Value Date  ? CHOL 188 05/14/2021  ? TRIG 69 05/14/2021  ? HDL 64 05/14/2021  ? CHOLHDL 2.9 05/14/2021  ? LDLCALC 111 (H) 05/14/2021  ? Dougherty 88 01/17/2021  ? ?Lab Results  ?Component Value Date  ? TSH 1.16 06/18/2021  ? TSH 0.877 05/30/2021  ? ? ?Therapeutic Level Labs: ?No results found for:  LITHIUM ?No results found for: VALPROATE ?No components found for:  CBMZ ? ?Current Medications: ?Current Outpatient Medications  ?Medication Sig Dispense Refill  ? clonazePAM (KLONOPIN) 0.5 MG tablet Take 1 tablet (0.5 mg total) by mouth at bedtime. 30 tablet 2  ? sertraline (ZOLOFT) 50 MG tablet Take 1 tablet (50 mg total) by mouth daily. 30 tablet 2  ? dicyclomine (BENTYL) 10 MG capsule Take 30 minutes before meals. 90 capsule 0  ? dronabinol (MARINOL) 2.5 MG capsule Take 1 capsule (2.5 mg total) by mouth 2 (two) times daily before  a meal. 60 capsule 2  ? MAGNESIUM PO Take 1 tablet by mouth daily as needed (for leg cramps).    ? Multiple Vitamins-Calcium (ONE-A-DAY WOMENS FORMULA) TABS Take 1 tablet by mouth daily with breakfast.    ? Nutritional Supplements (,FEEDING SUPPLEMENT, PROSOURCE PLUS) liquid Take 30 mLs by mouth 3 (three) times daily between meals. 887 mL 1  ? ondansetron (ZOFRAN-ODT) 4 MG disintegrating tablet Take 1 tablet (4 mg total) by mouth every 8 (eight) hours as needed for nausea or vomiting. 30 tablet 0  ? ?No current facility-administered medications for this visit.  ? ? ?Medication Side Effects: none ? ?Orders placed this visit:  No orders of the defined types were placed in this encounter. ? ? ?Psychiatric Specialty Exam: ? ?Review of Systems  ?Musculoskeletal:  Negative for gait problem.  ?Neurological:  Negative for tremors.  ?Psychiatric/Behavioral:    ?     Please refer to HPI   ?Blood pressure 116/79, pulse 95, height 5\' 1"  (1.549 m), weight 122 lb 9.6 oz (55.6 kg), last menstrual period 06/13/2021.Body mass index is 23.17 kg/m?.  ?General Appearance: Casual, Neat, and Well Groomed  ?Eye Contact:  Good  ?Speech:  Clear and Coherent and Normal Rate  ?Volume:  Normal  ?Mood:  Anxious  ?Affect:  Appropriate and Congruent  ?Thought Process:  Coherent  ?Orientation:  Full (Time, Place, and Person)  ?Thought Content: Logical   ?Suicidal Thoughts:  No  ?Homicidal Thoughts:  No  ?Memory:  WNL   ?Judgement:  Good  ?Insight:  Good  ?Psychomotor Activity:  Normal  ?Concentration:  Concentration: Good and Attention Span: Good  ?Recall:  Good  ?Fund of Knowledge: Good  ?Language: Good  ?Assets:  Communication Skil

## 2021-07-12 ENCOUNTER — Ambulatory Visit (HOSPITAL_COMMUNITY): Admission: RE | Admit: 2021-07-12 | Payer: No Typology Code available for payment source | Source: Ambulatory Visit

## 2021-07-12 ENCOUNTER — Encounter (HOSPITAL_COMMUNITY): Payer: Self-pay

## 2021-07-16 ENCOUNTER — Telehealth: Payer: Self-pay | Admitting: Adult Health

## 2021-07-16 NOTE — Telephone Encounter (Signed)
Please see message from patient. I don't see [facility] mentioned in your note.  Patient has F/U early June.  I can call patient to have testing done if you would prefer to have it done now so that you would have results to discuss at next visit if you'd like.  ?

## 2021-07-16 NOTE — Telephone Encounter (Signed)
Pt LVM @ 3:00p.  At her last visit, she discussed possible genetic testing (I assume she means the [facility]).  She wants to know the next steps in that process. ?

## 2021-07-17 ENCOUNTER — Inpatient Hospital Stay: Payer: No Typology Code available for payment source | Attending: Internal Medicine | Admitting: Genetic Counselor

## 2021-07-17 ENCOUNTER — Encounter: Payer: Self-pay | Admitting: Genetic Counselor

## 2021-07-17 ENCOUNTER — Inpatient Hospital Stay: Payer: No Typology Code available for payment source

## 2021-07-17 DIAGNOSIS — Z8481 Family history of carrier of genetic disease: Secondary | ICD-10-CM

## 2021-07-17 NOTE — Progress Notes (Signed)
REFERRING PROVIDER: ?de [place], Blondell Reveal, MD ?[phone] ?[place],  [place] [address] ? ?PRIMARY PROVIDER:  ?Jeanie Sewer, NP ? ?PRIMARY REASON FOR VISIT:  ?1. Family history of BRCA gene mutation   ? ? ?HISTORY OF PRESENT ILLNESS:   ?[name], a 26 y.o. female, was seen for a [facility] cancer genetics consultation at the request of Dr. Tennis Must [place] due to a family history of a BRCA gene mutation.  [name] presents to clinic today to discuss the possibility of a hereditary predisposition to cancer, to discuss genetic testing, and to further clarify her future cancer risks, as well as potential cancer risks for family members.  ? ?[name] is a 26 y.o. female with no personal history of cancer.   ? ?CANCER HISTORY:  ?Oncology History  ? No history exists.  ? ? ?RISK FACTORS:  ?Menarche was at age 67.  ?First live birth at age 89.  ?OCP use for approximately 6 years.  ?Ovaries intact: yes.  ?Uterus intact: yes.  ?Menopausal status: premenopausal.  ?HRT use: 0 years. ?Colonoscopy: no ?Mammogram within the last year: no. ?Any excessive radiation exposure in the past: no ? ?Past Medical History:  ?Diagnosis Date  ? Allergy   ? ASCUS of cervix with negative high risk HPV   ? Asthma   ? Phreesia 02/13/2020  ? Generalized anxiety disorder 06/28/2021  ? GERD (gastroesophageal reflux disease)   ? Phreesia 02/13/2020  ? IUD (intrauterine device) in place 09/2019  ? liletta  ? Paroxysmal SVT (supraventricular tachycardia) (HCC)   ? ? ?Past Surgical History:  ?Procedure Laterality Date  ? ESOPHAGOGASTRODUODENOSCOPY (EGD) WITH PROPOFOL N/A 06/14/2021  ? Procedure: ESOPHAGOGASTRODUODENOSCOPY (EGD) WITH PROPOFOL;  Surgeon: Arta Silence, MD;  Location: [facility];  Service: Gastroenterology;  Laterality: N/A;  ? WISDOM TOOTH EXTRACTION    ? ? ?Social History  ? ?Socioeconomic History  ? Marital status: Single  ?  Spouse name: Not on file  ? Number of children: Not on file  ? Years of education: Not on file  ? Highest  education level: Not on file  ?Occupational History  ? Not on file  ?Tobacco Use  ? Smoking status: Never  ? Smokeless tobacco: Never  ?Vaping Use  ? Vaping Use: Never used  ?Substance and Sexual Activity  ? Alcohol use: Never  ? Drug use: Never  ? Sexual activity: Not Currently  ?  Birth control/protection: I.U.D.  ?Other Topics Concern  ? Not on file  ?Social History Narrative  ? Not on file  ? ?Social Determinants of Health  ? ?Financial Resource Strain: Not on file  ?Food Insecurity: Not on file  ?Transportation Needs: Not on file  ?Physical Activity: Not on file  ?Stress: Not on file  ?Social Connections: Not on file  ?  ? ?FAMILY HISTORY:  ?We obtained a detailed, 4-generation family history.  Significant diagnoses are listed below: ?Family History  ?Problem Relation Age of Onset  ? Breast cancer Mother 70  ?     recurrence age 57, reports BRCA1+  ? Cancer Mother   ? Cancer Maternal Aunt 4  ?     died at 85, maternal great aunt  ? Leukemia Maternal Grandfather   ?     dx. childhood  ? Colon cancer Maternal Grandfather 47  ? Prostate cancer Paternal Grandfather   ?     metastatic  ? Ovarian cancer Maternal Great-grandmother 54 ?  ? ? ? ? ?[name]'s mother was diagnosed with  breast cancer at age 57 and had a recurrence at age 6, she died at age 42 due to breast cancer metastasis. Her maternal great aunt (grandmother's sister) was diagnosed with uterine cancer at age 28 and died at age 57. Her maternal great grandmother (grandmother's mother) was diagnosed with ovarian cancer at age 54, she died at age 64. Her maternal grandfather was diagnosed with leukemia in childhood and colon cancer at age 65, he died at age 85.  ? ?[name]'s paternal uncle was diagnosed with brain cancer and a second paternal uncle was diagnosed with prostate cancer. Her paternal aunt was diagnosed with breast cancer. Her paternal grandfather was diagnosed with metastatic prostate cancer, he is deceased. He possibly had positive genetic  testing in a polyposis gene.  ? ?There is no reported Ashkenazi Jewish ancestry.  ? ?GENETIC COUNSELING ASSESSMENT: [name] is a 27 y.o. female with a family history of a BRCA gene mutation. We, therefore, discussed and recommended the following at today's visit.  ? ?The cancers associated with BRCA1 are: ? ?Female breast cancer, up to an 87% risk ?Up to 73% of the breast cancers diagnosed in women with BRCA1 mutations are triple negative breast cancer ?In women with a history of breast cancer, the cumulative risk for contralateral breast cancer 5 years after breast cancer diagnosis is 15%. The cumulative 20- year risk is 40% or greater. ?Female breast cancer, up to a 2% risk ?Ovarian cancer, up to a 54% risk ?Pancreatic cancer, 1-3% risk ?Prostate cancer, elevated risk ?BRCA1 also has preliminary evidence for an association with melanoma ?Limited data suggest there may be a slightly increased risk of serous uterine cancer ?  ? ?Management Recommendations: ? ?Breast Screening/Risk Reduction: ? ?Women: ?Breast cancer screening includes: ?Breast awareness beginning at age 59 ?Monthly self-breast examination beginning at age 17 ?Clinical breast examination every 6-12 months beginning at age 22 or at the age of the earliest diagnosed breast cancer in the family, if onset was before age 32 ?Annual breast MRI with contrast beginning at age 75-29 (or annual mammograms with consideration of tomosynthesis if MRI is unavailable), although the age to initiate screening may be individualized based on family history ?Annual mammogram beginning at age 57 until age 63 with consideration of tomosynthesis with continuation of annual breast MRI with contrast ?The option of prophylactic bilateral risk-reducing mastectomy (RRM), removal of the breast tissue before cancer develops, is the best option for significantly decreasing the risk of developing breast cancer. Studies have shown mastectomies reduce the risk of breast cancer by  90-95% in women with a BRCA1 mutation. Breast reconstruction can also be performed immediately following the mastectomy, depending on the type of reconstruction chosen. ?For women with a BRCA1 pathogenic or likely pathogenic variant who are treated for breast cancer and have not had a bilateral mastectomy, screening with annual mammogram with consideration of tomosynthesis and breast MRI should continue as described above. ?  ?Gynecological Cancer Screening/Risk Reduction: ?It is recommended that women with a BRCA1 mutation consider having a risk-reducing salpingo oophorectomy (RRSO), removal of the ovaries and fallopian tubes, between the ages of 55-40 or once childbearing is completed. Having a RRSO is estimated to reduce the risk of ovarian cancer by up to 96%. There is still a small risk of developing an "ovarian-like" cancer in the lining of the abdomen, called the peritoneum. ?Another benefit to having the ovaries removed is the risk reduction for breast cancer. If the ovaries are removed before menopause, the risk of  developing breast cancer is reduced. ?Women undergoing a RRSO should be aware of the potential risks and benefits of concurrent hysterectomy. Hormone replacement therapy could be considered based on the physician's discretion. ?Ovarian cancer screening is an option for women who chose not to have a RRSO or who, as of yet, have not completed their family. Current screening methods for ovarian cancer are neither sensitive nor specific, meaning that often early stage ovarian cancer cannot be diagnosed through this screening.  Screening can also be falsely positive with no cancer present. For this reason, RRSO is recommended over screening. If ovarian cancer screening is recommended by your physician, it could include: ?CA-125 blood tests ?Transvaginal ultrasounds ?Clinical pelvic exams ?  ?Skin Cancer Screening and Risk Reduction: ?Regular skin self-examinations ?Individuals should notify their  physicians of any changes to moles such as increasing in size, darkening in color, or other change in appearance. ?Annual skin examinations by a dermatologist  ?Follow sun-safety recommendations such as: ?Using

## 2021-07-19 NOTE — Telephone Encounter (Signed)
Patient called back. She will check with her insurance company and call back.

## 2021-07-19 NOTE — Telephone Encounter (Signed)
Ok - If she wants to have it done, have her verify with insurance and we can get it done before next appointment. Ty.

## 2021-07-19 NOTE — Telephone Encounter (Signed)
LVM to [facility] 

## 2021-07-20 ENCOUNTER — Encounter (HOSPITAL_COMMUNITY)
Admission: RE | Admit: 2021-07-20 | Discharge: 2021-07-20 | Disposition: A | Discharge status: Home / Self Care | Payer: No Typology Code available for payment source | Source: Ambulatory Visit | Attending: Nurse Practitioner | Admitting: Nurse Practitioner

## 2021-07-20 ENCOUNTER — Other Ambulatory Visit: Payer: Self-pay | Admitting: Nurse Practitioner

## 2021-07-20 DIAGNOSIS — R112 Nausea with vomiting, unspecified: Secondary | ICD-10-CM | POA: Diagnosis present

## 2021-07-20 DIAGNOSIS — K30 Functional dyspepsia: Secondary | ICD-10-CM | POA: Diagnosis present

## 2021-07-20 MED ORDER — TECHNETIUM TC 99M MEBROFENIN IV KIT
5.2000 | PACK | Freq: Once | INTRAVENOUS | Status: AC
  Administered 2021-07-20: 5.2 via INTRAVENOUS

## 2021-07-25 ENCOUNTER — Telehealth: Payer: Self-pay | Admitting: Adult Health

## 2021-07-25 NOTE — Telephone Encounter (Signed)
Pt says the CPT code she was given to the genetic testing is showing up to her insurance company as 'experimental'.  They want a prior auth and may or may not pay for it.  Therefore, the pt is wanting to know the cost of the test (in case they won't play for it, I assume?)  Next appt 6/6

## 2021-07-26 ENCOUNTER — Encounter: Payer: Self-pay | Admitting: Family

## 2021-07-26 ENCOUNTER — Ambulatory Visit (INDEPENDENT_AMBULATORY_CARE_PROVIDER_SITE_OTHER): Payer: No Typology Code available for payment source | Admitting: Family

## 2021-07-26 VITALS — BP 115/75 | HR 94 | Temp 98.2°F | Resp 16 | Ht 61.0 in | Wt 124.2 lb

## 2021-07-26 DIAGNOSIS — F32A Depression, unspecified: Secondary | ICD-10-CM | POA: Diagnosis not present

## 2021-07-26 DIAGNOSIS — F419 Anxiety disorder, unspecified: Secondary | ICD-10-CM | POA: Diagnosis not present

## 2021-07-26 DIAGNOSIS — L729 Follicular cyst of the skin and subcutaneous tissue, unspecified: Secondary | ICD-10-CM | POA: Diagnosis not present

## 2021-07-26 DIAGNOSIS — G4452 New daily persistent headache (NDPH): Secondary | ICD-10-CM | POA: Diagnosis not present

## 2021-07-26 MED ORDER — NAPROXEN 500 MG PO TABS: 500.0000 mg | ORAL_TABLET | Freq: Two times a day (BID) | ORAL | 0 refills | Status: DC

## 2021-07-26 NOTE — Assessment & Plan Note (Addendum)
finally seen by [facility], started on Zoloft & Klonopin prn about 2 weeks ago, tolerating. weight increasing, no longer taking the Marinol.

## 2021-07-26 NOTE — Progress Notes (Signed)
Subjective:     Patient ID: Kristina Blair, female    DOB: 07/29/1995, 26 y.o.   MRN: [id number]  Chief Complaint  Patient presents with   Headache    Frequent headaches still    Anxiety    Was seen by psychiatrist, started on Setraline 50 mg daily nad klonopin 0.5 mg nightly    nodule    Bilateral breasts    HPI: Headaches:  reports starting behind her eyes & radiates to top & left side of head, daily, wakes up with it and it continues all day. Feels an adrenaline rush and heart races prior to HA. pt reports also having increased anxiety. Reports HA continuing despite now treating anxiety. Anxiety/Depression: Patient complains of anxiety disorder, panic attacks, and depression.   She has the following symptoms: difficulty concentrating, feelings of losing control, irritable, palpitations, racing thoughts, shortness of breath, sweating.  Onset of symptoms was approximately  months ago, She denies current suicidal and homicidal ideation.  Possible organic causes contributing are: none.  Risk factors: increased anxiety.  Previous treatment includes no meds or therapy. pt seen by [facility], started on meds.   Skin Lesion: Patient complains of a skin lesion of the  right & left breast . The lesion has been present for  week. Lesion has not changed since appearing. Symptoms associated with the lesion are: mild tenderness. Patient denies increasing diameter, itching, bleeding, drainage, infection.  Assessment & Plan:   Problem List Items Addressed This Visit       Other   Anxiety and depression - Primary    finally seen by [facility], started on Zoloft & Klonopin prn about 2 weeks ago, tolerating. weight increasing, no longer taking the Marinol.      New daily persistent headache    still persisting qod, never picked up Naproxen, started zoloft for anxiety about 2 weeks ago. resending Naproxen, advised to start and use for 1-2 weeks then back off and see if anxiety better and resolving HA. IF not,  let me know, will look at preventative HA meds.       Relevant Medications   naproxen (NAPROSYN) 500 MG tablet   Other Visit Diagnoses     Benign skin cyst    - right nipple pimple size, non-tender, flesh colored. left pimple size bump above left nipple, slightly tender, advised cleansing gently qd with hibiclens soap she has used for left axilla cyst. Let me know if not resolving.      Outpatient Medications Prior to Visit  Medication Sig Dispense Refill   clonazePAM (KLONOPIN) 0.5 MG tablet Take 1 tablet (0.5 mg total) by mouth at bedtime. 30 tablet 2   dicyclomine (BENTYL) 10 MG capsule TAKE 30 MINUTES BEFORE MEALS. 90 capsule 0   MAGNESIUM PO Take 1 tablet by mouth daily as needed (for leg cramps).     Multiple Vitamins-Calcium (ONE-A-DAY WOMENS FORMULA) TABS Take 1 tablet by mouth daily with breakfast.     Nutritional Supplements (,FEEDING SUPPLEMENT, PROSOURCE PLUS) liquid Take 30 mLs by mouth 3 (three) times daily between meals. 887 mL 1   ondansetron (ZOFRAN-ODT) 4 MG disintegrating tablet Take 1 tablet (4 mg total) by mouth every 8 (eight) hours as needed for nausea or vomiting. 30 tablet 0   sertraline (ZOLOFT) 50 MG tablet Take 1 tablet (50 mg total) by mouth daily. 30 tablet 2   dronabinol (MARINOL) 2.5 MG capsule Take 1 capsule (2.5 mg total) by mouth 2 (two) times daily before a meal.  60 capsule 2   No facility-administered medications prior to visit.    Past Medical History:  Diagnosis Date   Abnormal weight loss 06/18/2021   Allergy    ASCUS of cervix with negative high risk HPV    Asthma    Phreesia 02/13/2020   Generalized anxiety disorder 06/28/2021   GERD (gastroesophageal reflux disease)    Phreesia 02/13/2020   IUD (intrauterine device) in place 09/2019   liletta   Malnutrition of moderate degree 06/11/2021   Paroxysmal SVT (supraventricular tachycardia) (HCC)     Past Surgical History:  Procedure Laterality Date   ESOPHAGOGASTRODUODENOSCOPY (EGD) WITH  PROPOFOL N/A 06/14/2021   Procedure: ESOPHAGOGASTRODUODENOSCOPY (EGD) WITH PROPOFOL;  Surgeon: Arta Silence, MD;  Location: [facility];  Service: Gastroenterology;  Laterality: N/A;   WISDOM TOOTH EXTRACTION      Allergies  Allergen Reactions   Kiwi Extract Itching and Other (See Comments)    Tongue itching   Reglan [Metoclopramide] Anxiety       Objective:    Physical Exam Vitals and nursing note reviewed.  Constitutional:      Appearance: Normal appearance.  Cardiovascular:     Rate and Rhythm: Normal rate and regular rhythm.  Pulmonary:     Effort: Pulmonary effort is normal.     Breath sounds: Normal breath sounds.  Musculoskeletal:        General: Normal range of motion.  Skin:    General: Skin is warm and dry.     Findings: Lesion (pinpoint on right nipple, skin colored, non-tender; 2nd 0.3cm diameter tender pimple-like appearance just above left nipple) present.       Neurological:     Mental Status: She is alert.  Psychiatric:        Mood and Affect: Mood normal.        Behavior: Behavior normal.    BP 115/75   Pulse 94   Temp 98.2 F (36.8 C) (Temporal)   Resp 16   Ht 5\' 1"  (1.549 m)   Wt 124 lb 3.2 oz (56.3 kg)   SpO2 100%   BMI 23.47 kg/m  Wt Readings from Last 3 Encounters:  07/26/21 124 lb 3.2 oz (56.3 kg)  06/28/21 120 lb 8 oz (54.7 kg)  06/22/21 119 lb 12.8 oz (54.3 kg)        Meds ordered this encounter  Medications   naproxen (NAPROSYN) 500 MG tablet    Sig: Take 1 tablet (500 mg total) by mouth 2 (two) times daily with a meal.    Dispense:  30 tablet    Refill:  0    2nd time sending prescription    Order Specific Question:   Supervising Provider    Answer:   ANDY, CAMILLE L [[id number]]    Jeanie Sewer, NP

## 2021-07-26 NOTE — Assessment & Plan Note (Deleted)
finally seen by [facility], started on Zoloft & Klonopin prn

## 2021-07-26 NOTE — Assessment & Plan Note (Addendum)
still persisting qod, never picked up Naproxen, started zoloft for anxiety about 2 weeks ago. resending Naproxen, advised to start and use for 1-2 weeks then back off and see if anxiety better and resolving HA. IF not, let me know, will look at preventative HA meds.

## 2021-07-27 ENCOUNTER — Ambulatory Visit: Payer: No Typology Code available for payment source | Admitting: Family

## 2021-07-27 NOTE — Telephone Encounter (Signed)
Talked with [facility] and CPT is [phone], which comes back as experimental. GS rep said to tell insurance company it is a Insurance risk surveyor. Gave patient the website.

## 2021-08-03 ENCOUNTER — Ambulatory Visit: Payer: No Typology Code available for payment source | Admitting: Gastroenterology

## 2021-08-04 ENCOUNTER — Other Ambulatory Visit: Payer: Self-pay | Admitting: Adult Health

## 2021-08-04 DIAGNOSIS — F320 Major depressive disorder, single episode, mild: Secondary | ICD-10-CM

## 2021-08-04 DIAGNOSIS — F422 Mixed obsessional thoughts and acts: Secondary | ICD-10-CM

## 2021-08-04 DIAGNOSIS — F418 Other specified anxiety disorders: Secondary | ICD-10-CM

## 2021-08-06 ENCOUNTER — Telehealth: Payer: Self-pay | Admitting: Genetic Counselor

## 2021-08-06 NOTE — Telephone Encounter (Signed)
I attempted to contact Ms. Williamson to discuss her genetic testing results (47 genes). I left a voicemail requesting she call me back at [phone].  Lalla Brothers, MS, [facility] Genetic Counselor [email or web address] (P) [phone]

## 2021-08-07 ENCOUNTER — Telehealth: Payer: Self-pay | Admitting: Genetic Counselor

## 2021-08-07 ENCOUNTER — Ambulatory Visit: Payer: No Typology Code available for payment source | Admitting: Adult Health

## 2021-08-07 ENCOUNTER — Encounter: Payer: Self-pay | Admitting: Genetic Counselor

## 2021-08-07 DIAGNOSIS — Z1379 Encounter for other screening for genetic and chromosomal anomalies: Secondary | ICD-10-CM | POA: Insufficient documentation

## 2021-08-07 NOTE — Telephone Encounter (Signed)
Error

## 2021-08-07 NOTE — Telephone Encounter (Signed)
I contacted Ms. Leyland to discuss her genetic testing results. No pathogenic variants were identified in the 47 genes analyzed. Detailed clinic note to follow.  The test report has been scanned into EPIC and is located under the Molecular Pathology section of the Results Review tab.  A portion of the result report is included below for reference.   Lalla Brothers, MS, [facility] Genetic Counselor [email or web address] (P) [phone]

## 2021-08-09 ENCOUNTER — Encounter: Payer: Self-pay | Admitting: Genetic Counselor

## 2021-08-10 ENCOUNTER — Ambulatory Visit: Payer: Self-pay | Admitting: Genetic Counselor

## 2021-08-10 DIAGNOSIS — Z1379 Encounter for other screening for genetic and chromosomal anomalies: Secondary | ICD-10-CM

## 2021-08-10 NOTE — Progress Notes (Signed)
HPI:   [name] was previously seen in the [facility] clinic due to a family history of cancer and concerns regarding a hereditary predisposition to cancer. Please refer to our prior cancer genetics clinic note for more information regarding our discussion, assessment and recommendations, at the time. [name] recent genetic test results were disclosed to her, as were recommendations warranted by these results. These results and recommendations are discussed in more detail below.  CANCER HISTORY:  Oncology History   No history exists.    FAMILY HISTORY:  We obtained a detailed, 4-generation family history.  Significant diagnoses are listed below:      Family History  Problem Relation Age of Onset   Breast cancer Mother 90        recurrence age 19, reports BRCA1+   Cancer Mother     Cancer Maternal Aunt 58        died at 53, maternal great aunt   Leukemia Maternal Grandfather          dx. childhood   Colon cancer Maternal Grandfather 32   Prostate cancer Paternal Grandfather          metastatic   Ovarian cancer Maternal Great-grandmother 9           [name]'s mother was diagnosed with breast cancer at age 45 and had a recurrence at age 3, she died at age 78 due to breast cancer metastasis. Her maternal great aunt (grandmother's sister) was diagnosed with uterine cancer at age 18 and died at age 35. Her maternal great grandmother (grandmother's mother) was diagnosed with ovarian cancer at age 37, she died at age 21. Her maternal grandfather was diagnosed with leukemia in childhood and colon cancer at age 72, he died at age 64.    [name] paternal uncle was diagnosed with brain cancer and a second paternal uncle was diagnosed with prostate cancer. Her paternal aunt was diagnosed with breast cancer. Her paternal grandfather was diagnosed with metastatic prostate cancer, he is deceased. He possibly had positive genetic testing in a polyposis gene.    There is no  reported Ashkenazi Jewish ancestry.   GENETIC TEST RESULTS:  The [facility] CustomNext Panel found no pathogenic mutations.   The CustomNext-Cancer+RNAinsight panel offered by [facility] includes sequencing and rearrangement analysis for the following 47 genes:  APC, ATM, AXIN2, BARD1, BMPR1A, BRCA1, BRCA2, BRIP1, CDH1, CDK4, CDKN2A, CHEK2, CTNNA1, DICER1, EPCAM, GREM1, HOXB13, KIT, MEN1, MLH1, MSH2, MSH3, MSH6, MUTYH, NBN, NF1, NTHL1, PALB2, PDGFRA, PMS2, POLD1, POLE, PTEN, RAD50, RAD51C, RAD51D, SDHA, SDHB, SDHC, SDHD, SMAD4, SMARCA4, STK11, TP53, TSC1, TSC2, and VHL.  RNA data is routinely analyzed for use in variant interpretation for all genes.  The test report has been scanned into EPIC and is located under the Molecular Pathology section of the Results Review tab.  A portion of the result report is included below for reference. Genetic testing reported out on 08/01/2021.       Cancer Screening Recommendations:  [name] test result was negative and did NOT reveal a mutation in the BRCA1 gene. [name] reports her mother had a BRCA1 gene mutation. However, she is unable to obtain a copy of her mother's genetic test results so that we can confirm that the cancer-causing mutation was identified in her family and she did not inherit it.   If [name]'s mother had a BRCA1 gene mutation, [name] would be considered general population risk for BRCA1-related cancers. Everyone is at  some risk to develop cancer. Therefore, we would recommend she continue to follow the cancer management and screening guidelines provided by her primary care physician.   However, if [name]'s mother did not have a gene mutation, [name] would be recommended to begin breast MRIs at age 7 and to begin mammograms at age 40.  Recommendations for Family Members:   Other members of the family may still carry a pathogenic variant in one of these genes that [name] did not inherit. Based on the family history, we  recommend her siblings have genetic counseling and testing.  Our contact number was provided. [name] questions were answered to her satisfaction, and she knows she is welcome to call us at anytime with additional questions or concerns.   Kristina Passy, MS, Kristina Blair Genetic Counselor [email or web address] (P) [phone]

## 2021-08-22 ENCOUNTER — Ambulatory Visit (INDEPENDENT_AMBULATORY_CARE_PROVIDER_SITE_OTHER): Payer: No Typology Code available for payment source | Admitting: Neurology

## 2021-08-22 ENCOUNTER — Encounter: Payer: Self-pay | Admitting: Neurology

## 2021-08-22 ENCOUNTER — Telehealth: Payer: Self-pay | Admitting: Neurology

## 2021-08-22 VITALS — BP 120/82 | HR 71 | Ht 61.0 in | Wt 128.0 lb

## 2021-08-22 DIAGNOSIS — G4452 New daily persistent headache (NDPH): Secondary | ICD-10-CM | POA: Diagnosis not present

## 2021-08-22 MED ORDER — AMITRIPTYLINE HCL 10 MG PO TABS: 10.0000 mg | ORAL_TABLET | Freq: Every day | ORAL | 3 refills | Status: DC

## 2021-08-22 NOTE — Patient Instructions (Addendum)
MRI Brain without contrast We will start patient on Elavil 10 mg nightly as a headache prevention  Continue Tylenol/ibuprofen as needed for the headaches. Return in 3 months for follow-up or sooner if worse

## 2021-08-22 NOTE — Telephone Encounter (Signed)
[employer] sent to GI they obtain auth 

## 2021-08-22 NOTE — Progress Notes (Signed)
[facility]  PATIENT: Kristina Blair DOB: 1996/02/24  REQUESTING CLINICIAN: [place], Raymond J, MD HISTORY FROM: Patient  REASON FOR VISIT: New daily headaches    HISTORICAL  CHIEF COMPLAINT:  Chief Complaint  Patient presents with   New Patient (Initial Visit)    NP/Internal referral for near syncope C/o daily headaches, states she wake up the headache, also c/o neck side neck pain     HISTORY OF PRESENT ILLNESS:  This is a 26 year old woman past medical history of insomnia, anxiety depression who is presenting with new daily headache.  Patient reported headache started since January, since then she has daily headache, she wakes up with a headache, and the headache frequency and intensity will decrease by the end of the day.  Headaches are located in the frontal region and biparietal.  Describes the pain as dull sometimes she will have ringing in the ear and neck pain.  Denies any nausea or vomiting associated with a headache, no phonophobia but there is photophobia.  Patient reports a history of migraine headache but states that this headache is different from her typical migraine.  She has not had any migraine for many years.  Currently she is in school, studying Copywriter, advertising.  Reported her stress level is very low because she is out of school.    OTHER MEDICAL CONDITIONS: Insomnia, Anxiet/Depression   REVIEW OF SYSTEMS: Full 14 system review of systems performed and negative with exception of: as noted in the HPI   ALLERGIES: Allergies  Allergen Reactions   Kiwi Extract Itching and Other (See Comments)    Tongue itching   Reglan [Metoclopramide] Anxiety    HOME MEDICATIONS: Outpatient Medications Prior to Visit  Medication Sig Dispense Refill   clonazePAM (KLONOPIN) 0.5 MG tablet Take 1 tablet (0.5 mg total) by mouth at bedtime. 30 tablet 2   dicyclomine (BENTYL) 10 MG capsule TAKE 30 MINUTES BEFORE MEALS. 90 capsule 0   Multiple Vitamins-Calcium  (ONE-A-DAY WOMENS FORMULA) TABS Take 1 tablet by mouth daily with breakfast.     naproxen (NAPROSYN) 500 MG tablet Take 1 tablet (500 mg total) by mouth 2 (two) times daily with a meal. 30 tablet 0   Nutritional Supplements (,FEEDING SUPPLEMENT, PROSOURCE PLUS) liquid Take 30 mLs by mouth 3 (three) times daily between meals. 887 mL 1   ondansetron (ZOFRAN-ODT) 4 MG disintegrating tablet Take 1 tablet (4 mg total) by mouth every 8 (eight) hours as needed for nausea or vomiting. 30 tablet 0   sertraline (ZOLOFT) 50 MG tablet TAKE 1 TABLET BY MOUTH EVERY DAY 90 tablet 0   MAGNESIUM PO Take 1 tablet by mouth daily as needed (for leg cramps).     No facility-administered medications prior to visit.    PAST MEDICAL HISTORY: Past Medical History:  Diagnosis Date   Abnormal weight loss 06/18/2021   Allergy    ASCUS of cervix with negative high risk HPV    Asthma    Phreesia 02/13/2020   Generalized anxiety disorder 06/28/2021   GERD (gastroesophageal reflux disease)    Phreesia 02/13/2020   IUD (intrauterine device) in place 09/2019   liletta   Malnutrition of moderate degree 06/11/2021   Paroxysmal SVT (supraventricular tachycardia) ([place])     PAST SURGICAL HISTORY: Past Surgical History:  Procedure Laterality Date   ESOPHAGOGASTRODUODENOSCOPY (EGD) WITH PROPOFOL N/A 06/14/2021   Procedure: ESOPHAGOGASTRODUODENOSCOPY (EGD) WITH PROPOFOL;  Surgeon: Arta Silence, MD;  Location: [facility];  Service: Gastroenterology;  Laterality: N/A;  WISDOM TOOTH EXTRACTION      FAMILY HISTORY: Family History  Problem Relation Age of Onset   Diabetes Mother    Hypertension Mother    Breast cancer Mother 35       recurrence age 80, reports BRCA1+   Early death Mother    Hyperlipidemia Mother    Miscarriages / [place] Mother    Obesity Mother    Alcohol abuse Mother    Cancer Mother    Bipolar disorder Father    Cerebral palsy Brother    Epilepsy Brother    ADD / ADHD Brother     Cancer Maternal Aunt 34       died at 29, maternal great aunt   Asthma Maternal Aunt    Diabetes Maternal Grandmother    Heart disease Maternal Grandmother    Hyperlipidemia Maternal Grandmother    Hypertension Maternal Grandmother    Arthritis Maternal Grandmother    COPD Maternal Grandmother    Hearing loss Maternal Grandmother    Leukemia Maternal Grandfather        dx. childhood   Early death Maternal Grandfather    Colon cancer Maternal Grandfather 32   Prostate cancer Paternal Grandfather        metastatic   Ovarian cancer Maternal Great-grandmother 63   Esophageal cancer Neg Hx    Pancreatic cancer Neg Hx    Rectal cancer Neg Hx    Stomach cancer Neg Hx    Liver cancer Neg Hx     SOCIAL HISTORY: Social History   Socioeconomic History   Marital status: Single    Spouse name: Not on file   Number of children: Not on file   Years of education: Not on file   Highest education level: Not on file  Occupational History   Not on file  Tobacco Use   Smoking status: Never   Smokeless tobacco: Never  Vaping Use   Vaping Use: Never used  Substance and Sexual Activity   Alcohol use: Never   Drug use: Never   Sexual activity: Not Currently    Birth control/protection: I.U.D.  Other Topics Concern   Not on file  Social History Narrative   Not on file   Social Determinants of Health   Financial Resource Strain: Not on file  Food Insecurity: Not on file  Transportation Needs: Not on file  Physical Activity: Not on file  Stress: Not on file  Social Connections: Not on file  Intimate Partner Violence: Not on file    PHYSICAL EXAM  GENERAL EXAM/CONSTITUTIONAL: Vitals:  Vitals:   08/22/21 0840  BP: 120/82  Pulse: 71  Weight: 128 lb (58.1 kg)  Height: 5' 1"  (1.549 m)   Body mass index is 24.19 kg/m. Wt Readings from Last 3 Encounters:  08/22/21 128 lb (58.1 kg)  07/26/21 124 lb 3.2 oz (56.3 kg)  06/28/21 120 lb 8 oz (54.7 kg)   Patient is in no  distress; well developed, nourished and groomed; neck is supple  EYES: Pupils round and reactive to light, Visual fields full to confrontation, Extraocular movements intacts,   MUSCULOSKELETAL: Gait, strength, tone, movements noted in Neurologic exam below  NEUROLOGIC: MENTAL STATUS:      No data to display         awake, alert, oriented to person, place and time recent and remote memory intact normal attention and concentration language fluent, comprehension intact, naming intact fund of knowledge appropriate  CRANIAL NERVE:  2nd - no papilledema or hemorrhages  on fundoscopic exam 2nd, 3rd, 4th, 6th - pupils equal and reactive to light, visual fields full to confrontation, extraocular muscles intact, no nystagmus 5th - facial sensation symmetric 7th - facial strength symmetric 8th - hearing intact 9th - palate elevates symmetrically, uvula midline 11th - shoulder shrug symmetric 12th - tongue protrusion midline  MOTOR:  normal bulk and tone, full strength in the BUE, BLE  SENSORY:  normal and symmetric to light touch, pinprick, temperature, vibration  COORDINATION:  finger-nose-finger, fine finger movements normal  REFLEXES:  deep tendon reflexes present and symmetric  GAIT/STATION:  normal   DIAGNOSTIC DATA (LABS, IMAGING, TESTING) - I reviewed patient records, labs, notes, testing and imaging myself where available.  Lab Results  Component Value Date   WBC 5.9 06/24/2021   HGB 13.3 06/24/2021   HCT 42.1 06/24/2021   MCV 90.5 06/24/2021   PLT 311 06/24/2021      Component Value Date/Time   NA 141 06/24/2021 1628   NA 139 01/17/2021 0926   K 3.7 06/24/2021 1628   CL 112 (H) 06/24/2021 1628   CO2 24 06/24/2021 1628   GLUCOSE 86 06/24/2021 1628   BUN 7 06/24/2021 1628   BUN 8 01/17/2021 0926   CREATININE 0.62 06/24/2021 1628   CALCIUM 9.3 06/24/2021 1628   PROT 7.5 06/19/2021 0622   PROT 7.3 01/17/2021 0926   ALBUMIN 4.2 06/19/2021 0622    ALBUMIN 4.9 01/17/2021 0926   AST 19 06/19/2021 0622   ALT 14 06/19/2021 0622   ALKPHOS 43 06/19/2021 0622   BILITOT 0.7 06/19/2021 0622   BILITOT 0.3 01/17/2021 0926   GFRNONAA >60 06/24/2021 1628   Lab Results  Component Value Date   CHOL 188 05/14/2021   HDL 64 05/14/2021   LDLCALC 111 (H) 05/14/2021   TRIG 69 05/14/2021   CHOLHDL 2.9 05/14/2021   Lab Results  Component Value Date   HGBA1C 5.2 01/17/2021   No results found for: "VITAMINB12" Lab Results  Component Value Date   TSH 1.16 06/18/2021    CT head 06/24/2021 No acute intracranial abnormality.    ASSESSMENT AND PLAN  26 y.o. year old female with history of insomnia, anxiety/depression, previous history of migraine who is presenting with new daily persistent headache since January.  Patient reports that she will wake up with the headaches, she had a head CT in April and was normal.  Patient likely have new daily persistent headache but cannot rule out idiopathic intracranial hypertension.  I will proceed with getting MRI brain and if negative then we will treat this as a new daily persistent headache but if there is evidence of IIH, I will contact patient and start her on Diamox.  She is comfortable with plans. I will see her in 3 months for follow-up.   1. New daily persistent headache      Patient Instructions  MRI Brain without contrast We will start patient on Elavil 10 mg nightly as a headache prevention  Continue Tylenol/ibuprofen as needed for the headaches. Return in 3 months for follow-up or sooner if worse   Orders Placed This Encounter  Procedures   MR BRAIN W WO CONTRAST    Meds ordered this encounter  Medications   amitriptyline (ELAVIL) 10 MG tablet    Sig: Take 1 tablet (10 mg total) by mouth at bedtime.    Dispense:  30 tablet    Refill:  3    Return in about 3 months (around 11/22/2021).    Adaleigh Warf  April Manson, MD 08/22/2021, 2:53 PM  [facility] [address],  [place], [place] [address] [phone]

## 2021-08-23 ENCOUNTER — Ambulatory Visit: Payer: No Typology Code available for payment source | Admitting: Adult Health

## 2021-08-24 ENCOUNTER — Encounter (HOSPITAL_BASED_OUTPATIENT_CLINIC_OR_DEPARTMENT_OTHER): Payer: Self-pay

## 2021-08-28 ENCOUNTER — Encounter: Payer: Self-pay | Admitting: Neurology

## 2021-08-30 ENCOUNTER — Ambulatory Visit
Admission: RE | Admit: 2021-08-30 | Discharge: 2021-08-30 | Disposition: A | Discharge status: Home / Self Care | Payer: No Typology Code available for payment source | Source: Ambulatory Visit | Attending: Neurology | Admitting: Neurology

## 2021-08-30 DIAGNOSIS — G4452 New daily persistent headache (NDPH): Secondary | ICD-10-CM

## 2021-08-30 MED ORDER — GADOBENATE DIMEGLUMINE 529 MG/ML IV SOLN
11.0000 mL | Freq: Once | INTRAVENOUS | Status: AC | PRN
  Administered 2021-08-30: 11 mL via INTRAVENOUS

## 2021-09-03 ENCOUNTER — Ambulatory Visit: Payer: No Typology Code available for payment source | Admitting: Gastroenterology

## 2021-09-03 NOTE — Progress Notes (Deleted)
HPI :  Kristina Blair is a 26 year old female with a past medical history of anxiety, depression, asthma, PSVT and GERD.   She was admitted to the hospital 06/02/2021 secondary to intractable nausea with vomiting x  1 to 2 weeks.  RUQ sonogram and CTAP were unrevealing.  Her symptoms improved after she received Zofran, Reglan and IV hydration.  Her diet was advanced from clear to full liquids.  A gastric emptying study was recommended and the patient elected to complete the study as an outpatient.  She was discharged home on 06/05/2021.   She went back to the ED 06/06/2021 with recurrent nausea and vomiting with epigastric pain.  She received IV fluid, Reglan and Ativan and her symptoms improved.  Labs were normal.  She was prescribed Pantoprazole 40 mg daily then discharged home.   She went back to the ED 06/07/2021 due to nausea and vomiting.  She was nontoxic-appearing and appeared well-hydrated.  She pulled out her IVs and states she wanted to go home.  There was some concern that she developed akathisia from Reglan.  She went home AMA.   She went back to the ED 06/08/2021 she was discharged home and returned back to the ED 8 hours later with nausea and vomiting.  She reported she was unable to keep anything down.  Urine drug screen was positive for tetrahydrocannabinol.  CBC, CMP and lipase levels were normal.  Beta-hCG 5.1 down from 6.9. Her exam was benign therefore she was discharged home.   She went back to the ED 06/09/2021 and she was admitted for further evaluation for suspected cannabinoid hyperemesis syndrome versus gastroparesis versus esophagitis/gastritis.   She underwent an EGD by Dr. Paulita Fujita during this admission which was normal.  A gastric emptying study was done during this admission which was normal but she was on antiemetics which renders the gastric emptying study inaccurate.  Her clinical status improved and she was discharged home 06/14/2021 with instructions to follow-up with [facility] and to follow-up with psychiatry.    She presented to the [place] ED 06/16/2021 secondary to left lower leg pain.  A left lower extremity Doppler study was recommended but could not be completed at [facility] so she was transferred to [facility] ED.  LLE Doppler study was negative for DVT and she was discharged home.   She was seen by Myrlene Broker NP with [facility] on 06/18/2021.  At that time, she complained of having muscle soreness and palpitations.  CK and TSH levels were normal.  She was prescribed Marinol 2.5 mg twice daily prior to meals for nausea and to increase her appetite.  She has not yet started taking Marinol.   She presented to the ED 06/19/2021 with shortness of breath, left chest pain and left leg numbness.  D-dimer 0.41. Beta Hcg < 5. She left the ED without being seen by the ED physician.   She presents to our office today as referred by her Dr. [place] for further evaluation regarding nausea, vomiting , upper abdominal pain and IBS.  Note, the patient was evaluated by [facility] during her recent hospital admission.  The patient wishes to proceed with her consult with [facility] as scheduled.   She initially developed fairly abrupt onset of nausea early April 2023.  A few days later, she started vomiting.  No specific food or stress triggers.  She described emesis as clear or nonbloody bilious or undigested food she had eaten  15 minutes earlier.  No coffee-ground emesis or hematemesis.  She continues to have NV and epigastric pain anytime she eats.  She endorses losing 15 pounds over the past 2 to 3 weeks.  No NSAID use.  No alcohol use.  She reported smoking marijuana once monthly, no marijuana use for the past 2-1/2 weeks.  She is taking Zofran every 6-8 hours typically 15 minutes before she attempts to eat any food.  She infrequently takes promethazine suppository.  She received Reglan in the ED which resulted in heightened  anxiety/panic attack.  She denies having any prior history of anxiety.  Her stress level is fairly low at this time.  No past history of bulimia or anorexia.  She is passing a fairly normal brown bowel movement daily.  No rectal bleeding or black stools.   She was seen by endocrinologist with [facility] due to episodes of hypoglycemia and night sweats and near syncopal episodes.  She was seen by cardiology and underwent an echo which was normal and a Zio patch which showed PVCs and PACs of less than 1%.   RUQ sonogram 06/02/2021: Gallbladder:No gallstones or wall thickening visualized. No sonographic Murphy sign noted by sonographer.   Common bile duct:Diameter: 0.3 cm   Liver:No focal lesion identified. Within normal limits in parenchymal echogenicity. Portal vein is patent on color Doppler imaging with normal direction of blood flow towards the liver.    IMPRESSION: Normal sonographic evaluation of the right upper abdomen.   CTAP with contrast 06/02/2021: Lower chest: Included lung bases are clear.  Heart size is normal.   Hepatobiliary: No focal liver abnormality is seen. No gallstones, gallbladder wall thickening, or biliary dilatation.   Pancreas: Unremarkable. No pancreatic ductal dilatation or surrounding inflammatory changes.   Spleen: Normal in size without focal abnormality.   Adrenals/Urinary Tract: Unremarkable adrenal glands. Kidneys enhance symmetrically without focal lesion, stone, or hydronephrosis. Ureters are nondilated. Urinary bladder appears unremarkable for the degree of distension.   Stomach/Bowel: Stomach is within normal limits. Appendix appears normal (series 4, image 63). No evidence of bowel wall thickening, distention, or inflammatory changes.   Vascular/Lymphatic: No significant vascular findings are present. No enlarged abdominal or pelvic lymph nodes.   Reproductive: Retroverted uterus containing IUD. Adnexal regions within normal limits for age.    Other: No free fluid. No abdominopelvic fluid collection. No pneumoperitoneum. No abdominal wall hernia.   Musculoskeletal: No acute or significant osseous findings.   IMPRESSION: No acute abdominopelvic findings.   Gastric emptying study 06/11/2021 completed during her hospital admission.  Expected location of the stomach in the left upper quadrant. Ingested meal empties the stomach gradually over the course of the study. 51% emptied at 1 hr ( normal >= 10%)   70% emptied at 2 hr ( normal >= 40%)   88% emptied at 3 hr ( normal >= 70%)   92% emptied at 4 hr ( normal >= 90%)   IMPRESSION: Normal gastric emptying study   EGD 06/14/2021 by Dr. Paulita Fujita: - Normal esophagus. - Normal stomach. - Normal duodenal bulb, first portion of the duodenum and second portion of the duodenum. - No obvious cause nausea/vomiting identified.   Echo 05/14/2021: Left ventricular ejection fraction, by estimation, is 60 to 65%. The left ventricle has normal function. The left ventricle has no regional wall motion abnormalities. Left ventricular diastolic parameters were normal. 1. Right ventricular systolic function is normal. The right ventricular size is normal. There is normal pulmonary artery systolic pressure. The estimated right  ventricular systolic pressure is 90.3 mmHg. 2. 3. The mitral valve is normal in structure. No evidence of mitral valve regurgitation. The aortic valve is tricuspid. Aortic valve regurgitation is not visualized. No aortic stenosis is present. 4. The inferior vena cava is normal in size with greater than 50% respiratory variability, suggesting right atrial pressure of 3 mmHg.   86) 26 year old female with intractable nausea and vomiting with epigastric pain.  Normal CBC, LFTs and lipase levels.  RUQ sonogram and CTAP were unrevealing.  EGD 06/14/2021 done by Dr. Paulita Fujita during her hospital admission was normal.  Gastric emptying study was normal but was done during her  hospital admission while she was receiving antiemetics and pain medications. Urine drug screen + for tetrahydrocannabinol. She was prescribed Marinol by her PCP 06/18/2021, she has not started taking it.  Diagnosis: Nonulcer dyspepsia  vs gallbladder dyskinesia +/- cannabinoid hyperemesis syndrome. -I do not recommend starting Marinol at this juncture, however, if she elects to take it, she should discontinue it if N/V worsens. -Dicyclomine 10 mg 1 po tid 30 minutes prior to meals -Zofran 1 mg ODT tid 15 minutes prior to meals -[facility] HIDA scan to rule out gallbladder dyskinesia.  Patient to hold Zofran and Dicyclomine 1 day prior to HIDA scan date. -Consider a repeat gastric emptying study off antiemetics for 2 to 3 days to rule out gastroparesis -FD guard 2 p.o. twice daily -Follow-up in our office in 4 to 6 weeks   2) Possible underlying anxiety manifesting by GI symptoms -Proceed with a appoint with psychiatrist as previously scheduled   3) History of hypoglycemia -Follow-up with PCP/endocrinology   4) History of palpitations/SVT -Continue follow-up with cardiology as needed   HIDA 07/20/21 - IMPRESSION: Normal uptake and excretion of biliary tracer.  Normal gallbladder ejection fraction at 73%.    MRI brain 08/30/21: IMPRESSION: This MRI of the brain with and without contrast shows the following: 1.  Couple punctate T2/FLAIR hyperintense foci in the subcortical white matter of the frontal lobes.  This is a nonspecific finding and most likely represents sequela of migraine headache or very minimal chronic microvascular ischemic changes.  None of the foci appear to be acute.  They do not enhance. 2.   No acute findings.  Normal enhancement pattern.         Past Medical History:  Diagnosis Date   Abnormal weight loss 06/18/2021   Allergy    ASCUS of cervix with negative high risk HPV    Asthma    Phreesia 02/13/2020   Generalized anxiety disorder 06/28/2021   GERD  (gastroesophageal reflux disease)    Phreesia 02/13/2020   IUD (intrauterine device) in place 09/2019   liletta   Malnutrition of moderate degree 06/11/2021   Paroxysmal SVT (supraventricular tachycardia) (HCC)      Past Surgical History:  Procedure Laterality Date   ESOPHAGOGASTRODUODENOSCOPY (EGD) WITH PROPOFOL N/A 06/14/2021   Procedure: ESOPHAGOGASTRODUODENOSCOPY (EGD) WITH PROPOFOL;  Surgeon: Arta Silence, MD;  Location: [facility];  Service: Gastroenterology;  Laterality: N/A;   WISDOM TOOTH EXTRACTION     Family History  Problem Relation Age of Onset   Diabetes Mother    Hypertension Mother    Breast cancer Mother 21       recurrence age 17, reports BRCA1+   Early death Mother    Hyperlipidemia Mother    Miscarriages / [place] Mother    Obesity Mother    Alcohol abuse Mother    Cancer Mother    Bipolar  disorder Father    Cerebral palsy Brother    Epilepsy Brother    ADD / ADHD Brother    Cancer Maternal Aunt 3       died at 91, maternal great aunt   Asthma Maternal Aunt    Diabetes Maternal Grandmother    Heart disease Maternal Grandmother    Hyperlipidemia Maternal Grandmother    Hypertension Maternal Grandmother    Arthritis Maternal Grandmother    COPD Maternal Grandmother    Hearing loss Maternal Grandmother    Leukemia Maternal Grandfather        dx. childhood   Early death Maternal Grandfather    Colon cancer Maternal Grandfather 32   Prostate cancer Paternal Grandfather        metastatic   Ovarian cancer Maternal Great-grandmother 73   Esophageal cancer Neg Hx    Pancreatic cancer Neg Hx    Rectal cancer Neg Hx    Stomach cancer Neg Hx    Liver cancer Neg Hx    Social History   Tobacco Use   Smoking status: Never   Smokeless tobacco: Never  Vaping Use   Vaping Use: Never used  Substance Use Topics   Alcohol use: Never   Drug use: Never   Current Outpatient Medications  Medication Sig Dispense Refill   amitriptyline (ELAVIL) 10  MG tablet Take 1 tablet (10 mg total) by mouth at bedtime. 30 tablet 3   clonazePAM (KLONOPIN) 0.5 MG tablet Take 1 tablet (0.5 mg total) by mouth at bedtime. 30 tablet 2   dicyclomine (BENTYL) 10 MG capsule TAKE 30 MINUTES BEFORE MEALS. 90 capsule 0   Multiple Vitamins-Calcium (ONE-A-DAY WOMENS FORMULA) TABS Take 1 tablet by mouth daily with breakfast.     naproxen (NAPROSYN) 500 MG tablet Take 1 tablet (500 mg total) by mouth 2 (two) times daily with a meal. 30 tablet 0   Nutritional Supplements (,FEEDING SUPPLEMENT, PROSOURCE PLUS) liquid Take 30 mLs by mouth 3 (three) times daily between meals. 887 mL 1   ondansetron (ZOFRAN-ODT) 4 MG disintegrating tablet Take 1 tablet (4 mg total) by mouth every 8 (eight) hours as needed for nausea or vomiting. 30 tablet 0   sertraline (ZOLOFT) 50 MG tablet TAKE 1 TABLET BY MOUTH EVERY DAY 90 tablet 0   No current facility-administered medications for this visit.   Allergies  Allergen Reactions   Kiwi Extract Itching and Other (See Comments)    Tongue itching   Reglan [Metoclopramide] Anxiety     Review of Systems: All systems reviewed and negative except where noted in HPI.    MR BRAIN W WO CONTRAST  Result Date: 08/30/2021  [facility] [address] [place], [place] [address] [phone] NEUROIMAGING REPORT STUDY DATE: 08/30/2021 PATIENT NAME: Ouita Nish DOB: 11-28-1995 MRN: [id number] EXAM: MRI Brain with and without contrast ORDERING CLINICIAN: Alric Ran, MD CLINICAL HISTORY: 26 year old woman with headaches COMPARISON FILMS: None TECHNIQUE:MRI of the brain with and without contrast was obtained utilizing 5 mm axial slices with T1, T2, T2 flair, SWI and diffusion weighted views.  T1 sagittal, T2 coronal and postcontrast views in the axial and coronal plane were obtained.  CONTRAST: 11 ml Multihance IMAGING SITE: [facility], [place], [place], [place] FINDINGS: On sagittal images, the spinal cord is imaged  caudally to C3 and is normal in caliber.   The contents of the posterior fossa are of normal size and position.   The pituitary gland and optic chiasm appear normal.  Brain volume appears normal.   The ventricles are normal in size and without distortion.  There are no abnormal extra-axial collections of fluid.  The cerebellum and brainstem appears normal.   The deep gray matter appears normal.  There are a couple punctate T2/FLAIR high in the subcortical white none of these appear to be acute.  They do not enhance..   Diffusion weighted images are normal.  Susceptibility weighted images are normal.   The orbits appear normal.   The VIIth/VIIIth nerve complex appears normal.  The mastoid air cells appear normal.  The paranasal sinuses appear normal.  Flow voids are identified within the major intracerebral arteries.  After the infusion of contrast material, a normal enhancement pattern is noted.   This MRI of the brain with and without contrast shows the following: 1.  Couple punctate T2/FLAIR hyperintense foci in the subcortical white matter of the frontal lobes.  This is a nonspecific finding and most likely represents sequela of migraine headache or very minimal chronic microvascular ischemic changes.  None of the foci appear to be acute.  They do not enhance. 2.   No acute findings.  Normal enhancement pattern. INTERPRETING PHYSICIAN: Richard A. Felecia Shelling, MD, PhD, FAAN Certified in  Neuroimaging by [employer]    Physical Exam: There were no vitals taken for this visit. Constitutional: Pleasant,well-developed, ***female in no acute distress. HEENT: Normocephalic and atraumatic. Conjunctivae are normal. No scleral icterus. Neck supple.  Cardiovascular: Normal rate, regular rhythm.  Pulmonary/chest: Effort normal and breath sounds normal. No wheezing, rales or rhonchi. Abdominal: Soft, nondistended, nontender. Bowel sounds active throughout. There are no masses palpable. No  hepatomegaly. Extremities: no edema Lymphadenopathy: No cervical adenopathy noted. Neurological: Alert and oriented to person place and time. Skin: Skin is warm and dry. No rashes noted. Psychiatric: Normal mood and affect. Behavior is normal.   ASSESSMENT AND PLAN:  Jeanie Sewer, NP

## 2021-09-10 ENCOUNTER — Ambulatory Visit: Payer: No Typology Code available for payment source | Admitting: Adult Health

## 2021-09-13 ENCOUNTER — Other Ambulatory Visit: Payer: Self-pay | Admitting: Neurology

## 2021-09-21 ENCOUNTER — Ambulatory Visit (INDEPENDENT_AMBULATORY_CARE_PROVIDER_SITE_OTHER): Payer: No Typology Code available for payment source | Admitting: Adult Health

## 2021-09-21 ENCOUNTER — Encounter: Payer: Self-pay | Admitting: Adult Health

## 2021-09-21 DIAGNOSIS — F422 Mixed obsessional thoughts and acts: Secondary | ICD-10-CM

## 2021-09-21 DIAGNOSIS — F5105 Insomnia due to other mental disorder: Secondary | ICD-10-CM

## 2021-09-21 DIAGNOSIS — F320 Major depressive disorder, single episode, mild: Secondary | ICD-10-CM

## 2021-09-21 DIAGNOSIS — F99 Mental disorder, not otherwise specified: Secondary | ICD-10-CM

## 2021-09-21 DIAGNOSIS — F418 Other specified anxiety disorders: Secondary | ICD-10-CM

## 2021-09-21 MED ORDER — CLONAZEPAM 0.5 MG PO TABS: 0.5000 mg | ORAL_TABLET | Freq: Every day | ORAL | 2 refills | Status: DC

## 2021-09-21 MED ORDER — SERTRALINE HCL 100 MG PO TABS: 100.0000 mg | ORAL_TABLET | Freq: Every day | ORAL | 2 refills | Status: DC

## 2021-09-21 NOTE — Progress Notes (Signed)
Kristina Blair [id number] 1995-04-26 26 y.o.  Subjective:   Patient ID:  Kristina Blair is a 26 y.o. (DOB 1995-07-03) female.  Chief Complaint: No chief complaint on file.   HPI Kristina Blair presents to the office today for follow-up of    Describes mood today as "ok". Pleasant. Denies tearfulness. Mood symptoms - denies depression, but has low energy and motivation. Decreased anxiety. Denies irritability with exception of hormonal. Reports panic attacks  Decreased worry and rumination. Reports decreased obsessive thoughts. Reports decreased irrational thoughts. No longer thinking she has an "illness". Has not been to the emergency room in several months. Continues to struggle going to the grocery. Has been around family - visited them in [place]. Plans to go to a birthday party tomorrow and see how she does. Stable interest and motivation. Taking medications as prescribed.  Energy levels "super" lower. Active, does not have a regular exercise routine.   Enjoys some usual interests and activities. Single. Lives with grandmother and 1 dog. Spending time with family and friends. Appetite improved - no longer feeling nauseas. Weight gain - 6 pounds.  Sleeps well most nights. Averages 8 to 9 hours. Mind racing.  Focus and concentration stable. Completing tasks. Managing aspects of household. Works full time for [employer] - remote - going into the office once a month. Denies SI or HI.  Denies AH or VH. Denies substance use.  Previous medication trials: Zoloft, Prozac, Lamictal, Wellbutrin - helpful, various other medication   GAD-7    Flowsheet Row Office Visit from 06/28/2021 in [facility]  Total GAD-7 Score 21      PHQ2-9    Flowsheet Row Office Visit from 06/28/2021 in [facility] Visit from 05/30/2021 in [facility] Office Visit from 04/04/2021 in [facility] Office Visit from 01/17/2021 in [facility]  PHQ-2 Total Score 4 6 0 4  PHQ-9 Total Score 17 17 -- 20      Flowsheet Row ED from 06/19/2021 in [facility] EM[facility]GENCY DEPART[facility] ED from 06/16/2021 in [facility] EMERGENCY DEPARTMENT ED to Hosp-Admission (Discharged) from 06/09/2021 in [facility]TH  SURGICAL  C-SSRS RISK CATEGORY No Risk No Risk No Risk        Review of Systems:  Review of Systems  Musculoskeletal:  Negative for gait problem.  Neurological:  Negative for tremors.  Psychiatric/Behavioral:         Please refer to HPI    Medications: I have reviewed the patient's current medications.  Current Outpatient Medications  Medication Sig Dispense Refill   clonazePAM (KLONOPIN) 0.5 MG tablet Take 1 tablet (0.5 mg total) by mouth at bedtime. 30 tablet 2   dicyclomine (BENTYL) 10 MG capsule TAKE 30 MINUTES BEFORE MEALS. 90 capsule 0   Multiple Vitamins-Calcium (ONE-A-DAY WOMENS FORMULA) TABS Take 1 tablet by mouth daily with breakfast.     naproxen (NAPROSYN) 500 MG tablet Take 1 tablet (500 mg total) by mouth 2 (two) times daily with a meal. 30 tablet 0   Nutritional Supplements (,FEEDING SUPPLEMENT, PROSOURCE PLUS) liquid Take 30 mLs by mouth 3 (three) times daily between meals. 887 mL 1   ondansetron (ZOFRAN-ODT) 4 MG disintegrating tablet Take 1 tablet (4 mg total) by mouth every 8 (eight) hours as needed for nausea or vomiting. 30 tablet 0   sertraline (ZOLOFT) 100 MG tablet Take 1 tablet (100  mg total) by mouth daily. 30 tablet 2   No current facility-administered medications for this visit.    Medication Side Effects: None  Allergies:  Allergies  Allergen Reactions   Kiwi Extract Itching and Other (See Comments)    Tongue itching   Reglan [Metoclopramide] Anxiety    Past Medical History:  Diagnosis Date   Abnormal weight loss 06/18/2021   Allergy    ASCUS  of cervix with negative high risk HPV    Asthma    Phreesia 02/13/2020   Generalized anxiety disorder 06/28/2021   GERD (gastroesophageal reflux disease)    Phreesia 02/13/2020   IUD (intrauterine device) in place 09/2019   liletta   Malnutrition of moderate degree 06/11/2021   Paroxysmal SVT (supraventricular tachycardia) (HCC)     Past Medical History, Surgical history, Social history, and Family history were reviewed and updated as appropriate.   Please see review of systems for further details on the patient's review from today.   Objective:   Physical Exam:  There were no vitals taken for this visit.  Physical Exam Constitutional:      General: She is not in acute distress. Musculoskeletal:        General: No deformity.  Neurological:     Mental Status: She is alert and oriented to person, place, and time.     Coordination: Coordination normal.  Psychiatric:        Attention and Perception: Attention and perception normal. She does not perceive auditory or visual hallucinations.        Mood and Affect: Mood normal. Mood is not anxious or depressed. Affect is not labile, blunt, angry or inappropriate.        Speech: Speech normal.        Behavior: Behavior normal.        Thought Content: Thought content normal. Thought content is not paranoid or delusional. Thought content does not include homicidal or suicidal ideation. Thought content does not include homicidal or suicidal plan.        Cognition and Memory: Cognition and memory normal.        Judgment: Judgment normal.     Comments: Insight intact     Lab Review:     Component Value Date/Time   NA 141 06/24/2021 1628   NA 139 01/17/2021 0926   K 3.7 06/24/2021 1628   CL 112 (H) 06/24/2021 1628   CO2 24 06/24/2021 1628   GLUCOSE 86 06/24/2021 1628   BUN 7 06/24/2021 1628   BUN 8 01/17/2021 0926   CREATININE 0.62 06/24/2021 1628   CALCIUM 9.3 06/24/2021 1628   PROT 7.5 06/19/2021 0622   PROT 7.3 01/17/2021  0926   ALBUMIN 4.2 06/19/2021 0622   ALBUMIN 4.9 01/17/2021 0926   AST 19 06/19/2021 0622   ALT 14 06/19/2021 0622   ALKPHOS 43 06/19/2021 0622   BILITOT 0.7 06/19/2021 0622   BILITOT 0.3 01/17/2021 0926   GFRNONAA >60 06/24/2021 1628       Component Value Date/Time   WBC 5.9 06/24/2021 1628   RBC 4.65 06/24/2021 1628   HGB 13.3 06/24/2021 1628   HGB 13.0 01/17/2021 0926   HCT 42.1 06/24/2021 1628   HCT 39.1 01/17/2021 0926   PLT 311 06/24/2021 1628   PLT 297 01/17/2021 0926   MCV 90.5 06/24/2021 1628   MCV 85 01/17/2021 0926   MCH 28.6 06/24/2021 1628   MCHC 31.6 06/24/2021 1628   RDW 13.1 06/24/2021 1628   RDW 11.8 01/17/2021  0926   LYMPHSABS 2.0 06/24/2021 1628   LYMPHSABS 1.8 01/17/2021 0926   MONOABS 0.5 06/24/2021 1628   EOSABS 0.1 06/24/2021 1628   EOSABS 0.1 01/17/2021 0926   BASOSABS 0.0 06/24/2021 1628   BASOSABS 0.0 01/17/2021 0926    No results found for: "POCLITH", "LITHIUM"   No results found for: "PHENYTOIN", "PHENOBARB", "VALPROATE", "CBMZ"   .res Assessment: Plan:    Plan:  PDMP reviewed  Increase Zoloft 50mg  to 100mg  daily Clonazepam 0.5mg  daily  RTC 6 weeks  Patient advised to contact office with any questions, adverse effects, or acute worsening in signs and symptoms.   Discussed potential benefits, risk, and side effects of benzodiazepines to include potential risk of tolerance and dependence, as well as possible drowsiness.  Advised patient not to drive if experiencing drowsiness and to take lowest possible effective dose to minimize risk of dependence and tolerance.   Diagnoses and all orders for this visit:  Anxiety about health -     clonazePAM (KLONOPIN) 0.5 MG tablet; Take 1 tablet (0.5 mg total) by mouth at bedtime. -     sertraline (ZOLOFT) 100 MG tablet; Take 1 tablet (100 mg total) by mouth daily.  Insomnia due to other mental disorder -     clonazePAM (KLONOPIN) 0.5 MG tablet; Take 1 tablet (0.5 mg total) by mouth at  bedtime.  Mixed obsessional thoughts and acts -     sertraline (ZOLOFT) 100 MG tablet; Take 1 tablet (100 mg total) by mouth daily.  Current mild episode of major depressive disorder without prior episode (HCC) -     sertraline (ZOLOFT) 100 MG tablet; Take 1 tablet (100 mg total) by mouth daily.     Please see After Visit Summary for patient specific instructions.  Future Appointments  Date Time Provider Department Center  10/25/2021  9:20 AM [facility], , MD [facility]  11/28/2021 10:15 AM [facility], MD [facility] None    No orders of the defined types were placed in this encounter.   -------------------------------

## 2021-10-11 ENCOUNTER — Encounter: Payer: Self-pay | Admitting: Family

## 2021-10-13 ENCOUNTER — Other Ambulatory Visit: Payer: Self-pay | Admitting: Adult Health

## 2021-10-13 DIAGNOSIS — F418 Other specified anxiety disorders: Secondary | ICD-10-CM

## 2021-10-13 DIAGNOSIS — F320 Major depressive disorder, single episode, mild: Secondary | ICD-10-CM

## 2021-10-13 DIAGNOSIS — F422 Mixed obsessional thoughts and acts: Secondary | ICD-10-CM

## 2021-10-16 NOTE — Telephone Encounter (Signed)
she needs a virtual or office appointment please

## 2021-10-17 ENCOUNTER — Other Ambulatory Visit: Payer: Self-pay | Admitting: Family

## 2021-10-17 ENCOUNTER — Ambulatory Visit (INDEPENDENT_AMBULATORY_CARE_PROVIDER_SITE_OTHER): Payer: No Typology Code available for payment source | Admitting: Family

## 2021-10-17 ENCOUNTER — Encounter: Payer: Self-pay | Admitting: Family

## 2021-10-17 VITALS — BP 112/78 | HR 74 | Temp 98.7°F | Ht 61.0 in | Wt 131.2 lb

## 2021-10-17 DIAGNOSIS — L409 Psoriasis, unspecified: Secondary | ICD-10-CM | POA: Diagnosis not present

## 2021-10-17 DIAGNOSIS — M549 Dorsalgia, unspecified: Secondary | ICD-10-CM | POA: Diagnosis not present

## 2021-10-17 DIAGNOSIS — L700 Acne vulgaris: Secondary | ICD-10-CM

## 2021-10-17 MED ORDER — DOXYCYCLINE HYCLATE 100 MG PO TABS: 100.0000 mg | ORAL_TABLET | Freq: Two times a day (BID) | ORAL | 0 refills | Status: DC

## 2021-10-17 MED ORDER — CLINDAMYCIN-TRETINOIN 1.2-0.025 % EX GEL: Freq: Every day | CUTANEOUS | 0 refills | Status: DC

## 2021-10-17 NOTE — Progress Notes (Signed)
Patient ID: Kristina Blair, female    DOB: 07-Jan-1996, 26 y.o.   MRN: [id number]  Chief Complaint  Patient presents with   Back Pain    Pt states she has been having terrible back(upper near shoulder blade) for about 5 month and only on her menstrual cycle.  Pt states it has not resolved. Pt states she has seen chiropractors and physical therapist for it In the past. She states the pain is persistent and she is not able to sleep well at night. Has tried ibuprofen which does not help it.    Acne    Pt would like a referral to a dermatologist. Has tried peroxyl and differin gel which is making it worse.     HPI: Acne:  hx of psoriasis on her scalp, having painful cystic acne on her cheeks, and on lower jaw, has used LaRoche posay & CeraVe & Differin gel products that are not helping. Reports having bad acne as a teen and thinks she was Sprionolactone for a short time. Wondering if this is flaring again due to hormonal changes as she also is having irregular bleeding despite having an IUD. Scalp Psoriasis:  pt reports having this when younger and used a prescription shampoo that resolved it, does not remember name. Used to also have psoriatic lesions on neck and sides of face, but this flare is only on her scalp, with itchy patches. Upper back/neck pain: occurs with her menstrual cycles and abdominal pain at the same time, started about 5 months ago, also reports having more bleeding, spotting, irregular cycles which is new since she had an IUD placed about 3 yrs ago. Describes pain as achy, sharp, continuous, not relieved with Ibuprofen.  Assessment & Plan:  1. Acne vulgaris sending DOXY x2w & topical abt cream with tretinoin, advised on use & SE, pt to let me know if too expensive, and can send separately, sending referral to [facility]  - clindamycin-tretinoin (ZIANA) gel; Apply topically at bedtime.  Dispense: 30 g; Refill: 0 - doxycycline (VIBRA-TABS) 100 MG tablet; Take 1 tablet (100 mg total) by  mouth 2 (two) times daily.  Dispense: 28 tablet; Refill: 0 - Ambulatory referral to Dermatology  2. Acute upper back pain -  pt has appt with her OB as she states pain is only with her menstrual cycles and thinks something is wrong with her IUD. Advised trying the RX Naproxen I have sent in before, 1 pill bid and also can try OTC analgesic pain creams/patches.   3. Scalp psoriasis advised on trying T/Gel shampoo OTC extra strength. I have no other RX strength shampoos coming up in the medication database. If the pharmacy lets her know a RX shampoo, she can let me know and I will send in.   - Ambulatory referral to Dermatology   Subjective:    Outpatient Medications Prior to Visit  Medication Sig Dispense Refill   albuterol (VENTOLIN HFA) 108 (90 Base) MCG/ACT inhaler      clonazePAM (KLONOPIN) 0.5 MG tablet Take 1 tablet (0.5 mg total) by mouth at bedtime. 30 tablet 2   levonorgestrel (KYLEENA) 19.5 MG IUD 1 each by Intrauterine route once.     Multiple Vitamins-Calcium (ONE-A-DAY WOMENS FORMULA) TABS Take 1 tablet by mouth daily with breakfast.     naproxen (NAPROSYN) 500 MG tablet Take 1 tablet (500 mg total) by mouth 2 (two) times daily with a meal. 30 tablet 0   sertraline (ZOLOFT) 100 MG tablet TAKE 1 TABLET BY MOUTH  EVERY DAY 90 tablet 0   dicyclomine (BENTYL) 10 MG capsule TAKE 30 MINUTES BEFORE MEALS. 90 capsule 0   ondansetron (ZOFRAN-ODT) 4 MG disintegrating tablet Take 1 tablet (4 mg total) by mouth every 8 (eight) hours as needed for nausea or vomiting. 30 tablet 0   SUMAtriptan (IMITREX) 50 MG tablet  (Patient not taking: Reported on 10/17/2021)     Nutritional Supplements (,FEEDING SUPPLEMENT, PROSOURCE PLUS) liquid Take 30 mLs by mouth 3 (three) times daily between meals. 887 mL 1   No facility-administered medications prior to visit.   Past Medical History:  Diagnosis Date   Abnormal weight loss 06/18/2021   Allergy    ASCUS of cervix with negative high risk HPV     Asthma    Phreesia 02/13/2020   Generalized anxiety disorder 06/28/2021   GERD (gastroesophageal reflux disease)    Phreesia 02/13/2020   IUD (intrauterine device) in place 09/2019   liletta   Malnutrition of moderate degree 06/11/2021   Paroxysmal SVT (supraventricular tachycardia) (HCC)    Past Surgical History:  Procedure Laterality Date   ESOPHAGOGASTRODUODENOSCOPY (EGD) WITH PROPOFOL N/A 06/14/2021   Procedure: ESOPHAGOGASTRODUODENOSCOPY (EGD) WITH PROPOFOL;  Surgeon: Willis Modena, MD;  Location: [facility] ENDOSCOPY;  Service: Gastroenterology;  Laterality: N/A;   WISDOM TOOTH EXTRACTION     Allergies  Allergen Reactions   Kiwi Extract Itching and Other (See Comments)    Tongue itching   Reglan [Metoclopramide] Anxiety      Objective:    Physical Exam Vitals and nursing note reviewed.  Constitutional:      Appearance: Normal appearance.  Cardiovascular:     Rate and Rhythm: Normal rate and regular rhythm.  Pulmonary:     Effort: Pulmonary effort is normal.     Breath sounds: Normal breath sounds.  Musculoskeletal:        General: Normal range of motion.  Skin:    General: Skin is warm and dry.     Findings: Acne (forehead, cheeks, jaw line) and lesion (psoriatic patches scattered on scalp, mild) present.  Neurological:     Mental Status: She is alert.  Psychiatric:        Mood and Affect: Mood normal.        Behavior: Behavior normal.    BP 112/78 (BP Location: Left Arm, Patient Position: Sitting, Cuff Size: Large)   Pulse 74   Temp 98.7 F (37.1 C) (Temporal)   Ht 5\' 1"  (1.549 m)   Wt 131 lb 3.2 oz (59.5 kg)   LMP 10/16/2021 (Approximate) Comment: Every 2 weeks  SpO2 100%   BMI 24.79 kg/m  Wt Readings from Last 3 Encounters:  10/17/21 131 lb 3.2 oz (59.5 kg)  08/22/21 128 lb (58.1 kg)  07/26/21 124 lb 3.2 oz (56.3 kg)   *Extra time (30 min) spent with patient today which consisted of chart review, discussing diagnoses, work up, treatment, answering  questions, and documentation.     07/28/21, NP

## 2021-10-17 NOTE — Telephone Encounter (Signed)
Office visit with Judeth Cornfield today.

## 2021-10-17 NOTE — Patient Instructions (Addendum)
It was very nice to see you today!   I have sent over an antibiotic cream with Tretinoin to help your acne. Let me know if this is too expensive and I can send them separately.  Look for T/Gel Therapeutic Shampoo - extra strength - over the counter - for your scalp psoriasis, I could not find a prescription strength shampoo.  I have sent a referral for Dermatology, they will call you directly.  OK to use the Naproxen for your neck and back pain. You can also try over the counter Lidocaine cream, IcyHot, Biofreeze, Tiger Balm, etc and these all come in patches also to relieve pain as needed.    PLEASE NOTE:  If you had any lab tests please let us know if you have not heard back within a few days. You may see your results on MyChart before we have a chance to review them but we will give you a call once they are reviewed by [place]. If we ordered any referrals today, please let us know if you have not heard from their office within the next week.

## 2021-10-18 ENCOUNTER — Encounter: Payer: Self-pay | Admitting: Family

## 2021-10-18 DIAGNOSIS — R350 Frequency of micturition: Secondary | ICD-10-CM

## 2021-10-18 NOTE — Telephone Encounter (Signed)
Pt states she has not had a UTI in years and stated she used to get them all the time during her cycle when she use to wear pads but does not wear them anymore. Pt states she does drink lot of water and only symptom is Urinary frequency. Pt states when her grandmothers ketones are high she usually has to take her to the hospital because she is diabetic. Pt gave a verbalized understanding and stated she is mostly worried about the ketones being so high.

## 2021-10-18 NOTE — Telephone Encounter (Signed)
call and ask about last time she had a UTI? any other urinary sx? with mild leuks and abd pain would not normally treat. Her thirstiness & fatigue might indicate diabetes but her last blood sugars have all been in normal range. Thirst can be a sign to drink more water - need at least 2 liters daily. Urine should be light yellow to clear in color. Let me know what she says - thx

## 2021-10-23 ENCOUNTER — Telehealth: Payer: Self-pay | Admitting: Neurology

## 2021-10-23 NOTE — Telephone Encounter (Signed)
LVM and sent mychart msg asking pt to reschedule 9/27 appointment - MD out

## 2021-10-25 ENCOUNTER — Ambulatory Visit: Payer: No Typology Code available for payment source | Admitting: Gastroenterology

## 2021-10-25 NOTE — Telephone Encounter (Signed)
My message said for her to schedule this. Did she give a specimen to [facility]?

## 2021-10-30 NOTE — Telephone Encounter (Signed)
UA , microalbumin, and culture. Wonder why Melissa didn't ask for orders? Please apologize to Tierre, I did not order ahead.  I have put orders in this time, thx

## 2021-10-30 NOTE — Telephone Encounter (Signed)
Judeth Cornfield, there are no orders for pt to have Urine done in Epic. She will have to come back and give a specimen again. What orders do you want?

## 2021-11-02 ENCOUNTER — Ambulatory Visit: Payer: No Typology Code available for payment source | Admitting: Adult Health

## 2021-11-13 ENCOUNTER — Ambulatory Visit: Payer: No Typology Code available for payment source | Admitting: Adult Health

## 2021-11-26 ENCOUNTER — Encounter: Payer: Self-pay | Admitting: *Deleted

## 2021-11-27 ENCOUNTER — Ambulatory Visit (INDEPENDENT_AMBULATORY_CARE_PROVIDER_SITE_OTHER): Payer: Self-pay | Admitting: Adult Health

## 2021-11-27 DIAGNOSIS — F489 Nonpsychotic mental disorder, unspecified: Secondary | ICD-10-CM

## 2021-11-27 NOTE — Progress Notes (Signed)
Patient no show appointment. ? ?

## 2021-11-28 ENCOUNTER — Ambulatory Visit: Payer: No Typology Code available for payment source | Admitting: Neurology

## 2021-12-17 ENCOUNTER — Telehealth: Payer: Self-pay | Admitting: Adult Health

## 2021-12-17 NOTE — Telephone Encounter (Signed)
Pt called and made an appt for 11/8. She needs a refill on her klonopin. The pharmacy [facility] [place]

## 2021-12-17 NOTE — Telephone Encounter (Signed)
Patient should have a RF available at the pharmacy. Notified her.

## 2021-12-24 ENCOUNTER — Ambulatory Visit: Payer: No Typology Code available for payment source | Admitting: Adult Health

## 2022-01-01 ENCOUNTER — Ambulatory Visit: Payer: No Typology Code available for payment source | Admitting: Adult Health

## 2022-01-08 ENCOUNTER — Ambulatory Visit: Payer: No Typology Code available for payment source | Admitting: Gastroenterology

## 2022-01-16 ENCOUNTER — Telehealth: Payer: Self-pay | Admitting: Adult Health

## 2022-01-16 ENCOUNTER — Ambulatory Visit: Payer: No Typology Code available for payment source | Admitting: Adult Health

## 2022-01-16 NOTE — Telephone Encounter (Signed)
Pt lvm that she needs refills on her zoloft 100 mg and her klonopin. [facility] [place]. I have lvm for pt to call and schedule

## 2022-01-17 ENCOUNTER — Other Ambulatory Visit: Payer: Self-pay | Admitting: Adult Health

## 2022-01-17 ENCOUNTER — Telehealth: Payer: Self-pay | Admitting: Neurology

## 2022-01-17 ENCOUNTER — Telehealth: Payer: Self-pay | Admitting: Adult Health

## 2022-01-17 ENCOUNTER — Other Ambulatory Visit: Payer: Self-pay

## 2022-01-17 DIAGNOSIS — F422 Mixed obsessional thoughts and acts: Secondary | ICD-10-CM

## 2022-01-17 DIAGNOSIS — F5105 Insomnia due to other mental disorder: Secondary | ICD-10-CM

## 2022-01-17 DIAGNOSIS — F320 Major depressive disorder, single episode, mild: Secondary | ICD-10-CM

## 2022-01-17 DIAGNOSIS — F418 Other specified anxiety disorders: Secondary | ICD-10-CM

## 2022-01-17 MED ORDER — SERTRALINE HCL 100 MG PO TABS: 100.0000 mg | ORAL_TABLET | Freq: Every day | ORAL | 0 refills | Status: DC

## 2022-01-17 NOTE — Telephone Encounter (Signed)
Pended.

## 2022-01-17 NOTE — Telephone Encounter (Signed)
I am fine with the switch. Thank you. 

## 2022-01-17 NOTE — Telephone Encounter (Signed)
Called pt to reschedule 11/22 appointment- MD out. She is requesting a switch to a different provider as this is the second time she has been rescheduled and she does not feel her issues have been resolved. Pt is seen for headaches.  Dr. Teresa Coombs and Dr. Lucia Gaskins- would you be okay with switch?

## 2022-01-17 NOTE — Telephone Encounter (Signed)
Ok to send

## 2022-01-17 NOTE — Telephone Encounter (Signed)
Zoloft sent, clonazepam pended.

## 2022-01-17 NOTE — Telephone Encounter (Signed)
Pt called to schedule apt and request RF Sertraline and Clonazepam [facility].    Apt 11/28

## 2022-01-20 NOTE — Telephone Encounter (Signed)
Fine with me - thanks!!

## 2022-01-21 NOTE — Telephone Encounter (Signed)
LVM asking pt to call back to schedule appointment with Dr. Lucia Gaskins

## 2022-01-23 ENCOUNTER — Ambulatory Visit: Payer: No Typology Code available for payment source | Admitting: Neurology

## 2022-01-29 ENCOUNTER — Telehealth (INDEPENDENT_AMBULATORY_CARE_PROVIDER_SITE_OTHER): Payer: No Typology Code available for payment source | Admitting: Adult Health

## 2022-01-29 ENCOUNTER — Encounter: Payer: Self-pay | Admitting: Adult Health

## 2022-01-29 DIAGNOSIS — F5105 Insomnia due to other mental disorder: Secondary | ICD-10-CM | POA: Diagnosis not present

## 2022-01-29 DIAGNOSIS — F422 Mixed obsessional thoughts and acts: Secondary | ICD-10-CM | POA: Diagnosis not present

## 2022-01-29 DIAGNOSIS — F418 Other specified anxiety disorders: Secondary | ICD-10-CM | POA: Diagnosis not present

## 2022-01-29 DIAGNOSIS — F320 Major depressive disorder, single episode, mild: Secondary | ICD-10-CM | POA: Diagnosis not present

## 2022-01-29 MED ORDER — SERTRALINE HCL 100 MG PO TABS: ORAL_TABLET | ORAL | 2 refills | Status: DC

## 2022-01-29 MED ORDER — CLONAZEPAM 0.5 MG PO TABS: 0.5000 mg | ORAL_TABLET | Freq: Every day | ORAL | 2 refills | Status: DC

## 2022-01-29 NOTE — Progress Notes (Signed)
Kristina Blair [id number] 02-27-1996 26 y.o.  Virtual Visit via Video Note  I connected with pt @ on 01/29/22 at 10:20 AM EST by a video enabled telemedicine application and verified that I am speaking with the correct person using two identifiers.   I discussed the limitations of evaluation and management by telemedicine and the availability of in person appointments. The patient expressed understanding and agreed to proceed.  I discussed the assessment and treatment plan with the patient. The patient was provided an opportunity to ask questions and all were answered. The patient agreed with the plan and demonstrated an understanding of the instructions.   The patient was advised to call back or seek an in-person evaluation if the symptoms worsen or if the condition fails to improve as anticipated.  I provided 25 minutes of non-face-to-face time during this encounter.  The patient was located at home.  The provider was located at [place].   Aloha Gell, NP   Subjective:   Patient ID:  Kristina Blair is a 26 y.o. (DOB 1996-02-19) female.  Chief Complaint: No chief complaint on file.   HPI Kristina Blair presents for follow-up of anxiety about health, insomnia, mixed obsessional thoughts and acts, and current mild depression.  Describes mood today as "ok". Pleasant. Denies tearfulness. Mood symptoms - decreased depression - "I have my days" - "not as down as she was before starting Zoloft. Decreased anxiety. Decreased panic attacks. Decreased irritability. Decreased worry and rumination. Reports decreased obsessive thoughts. Reports decreased irrational thoughts. Stating "I worry when I need to worry". Feels like the Zoloft has been helpful - currently taking 141m - would like to increase dose to 1572mdaily. Stable interest and motivation. Taking medications as prescribed.  Energy levels lower. Active, does not have a regular exercise routine.   Enjoys some usual interests and  activities. Single. Lives with grandmother and 1 dog. Spending time with family and friends. Appetite adequate. Weight gain.  Sleeps well most nights. Averages 8 to 9 hours.   Focus and concentration "iffy" - struggles with ADD. Completing tasks. Managing aspects of household. Works full time for [employer] remote - going into the office once a month. Denies SI or HI.  Denies AH or VH. Denies self harm. Denies substance use.  Previous medication trials: Zoloft, Prozac, Lamictal, Wellbutrin - helpful, various other medication  Review of Systems:  Review of Systems  Musculoskeletal:  Negative for gait problem.  Neurological:  Negative for tremors.  Psychiatric/Behavioral:         Please refer to HPI    Medications: I have reviewed the patient's current medications.  Current Outpatient Medications  Medication Sig Dispense Refill   albuterol (VENTOLIN HFA) 108 (90 Base) MCG/ACT inhaler      clindamycin-tretinoin (ZIANA) gel Apply topically at bedtime. 30 g 0   clonazePAM (KLONOPIN) 0.5 MG tablet TAKE 1 TABLET BY MOUTH AT BEDTIME. 12 tablet 0   doxycycline (VIBRA-TABS) 100 MG tablet Take 1 tablet (100 mg total) by mouth 2 (two) times daily. 28 tablet 0   levonorgestrel (KYLEENA) 19.5 MG IUD 1 each by Intrauterine route once.     Multiple Vitamins-Calcium (ONE-A-DAY WOMENS FORMULA) TABS Take 1 tablet by mouth daily with breakfast.     naproxen (NAPROSYN) 500 MG tablet Take 1 tablet (500 mg total) by mouth 2 (two) times daily with a meal. 30 tablet 0   sertraline (ZOLOFT) 100 MG tablet Take 1 tablet (100 mg total) by mouth daily.  30 tablet 0   SUMAtriptan (IMITREX) 50 MG tablet  (Patient not taking: Reported on 10/17/2021)     No current facility-administered medications for this visit.    Medication Side Effects: None  Allergies:  Allergies  Allergen Reactions   Kiwi Extract Itching and Other (See Comments)    Tongue itching   Reglan [Metoclopramide] Anxiety     Past Medical History:  Diagnosis Date   Abnormal weight loss 06/18/2021   Allergy    Anxiety    ASCUS of cervix with negative high risk HPV    Asthma    Phreesia 02/13/2020   Generalized anxiety disorder 06/28/2021   GERD (gastroesophageal reflux disease)    [employer]reesia 02/13/2020   IUD (intrauterine device) in place 09/2019   liletta   Malnutrition of moderate degree 06/11/2021   Paroxysmal SVT (supraventricular tachycardia)     Family History  Problem Relation Age of Onset   Diabetes Mother    Hypertension Mother    Breast cancer Mother 53       recurrence age 28, reports BRCA1+   Early death Mother    Hyperlipidemia Mother    Miscarriages / Stillbirths Mother    Obesity Mother    Alcohol abuse Mother    Cancer Mother    Bipolar disorder Father    Cerebral palsy Brother    Epilepsy Brother    ADD / ADHD Brother    Cancer Maternal Aunt 93       died at 22, maternal great aunt   Asthma Maternal Aunt    Diabetes Maternal Grandmother    Heart disease Maternal Grandmother    Hyperlipidemia Maternal Grandmother    Hypertension Maternal Grandmother    Arthritis Maternal Grandmother    COPD Maternal Grandmother    Hearing loss Maternal Grandmother    Leukemia Maternal Grandfather        dx. childhood   Early death Maternal Grandfather    Colon cancer Maternal Grandfather 32   Prostate cancer Paternal Grandfather        metastatic   Ovarian cancer Maternal Great-grandmother 5   Esophageal cancer Neg Hx    Pancreatic cancer Neg Hx    Rectal cancer Neg Hx    Stomach cancer Neg Hx    Liver cancer Neg Hx     Social History   Socioeconomic History   Marital status: Single    Spouse name: Not on file   Number of children: Not on file   Years of education: Not on file   Highest education level: Not on file  Occupational History   Not on file  Tobacco Use   Smoking status: Never   Smokeless tobacco: Never  Vaping Use   Vaping Use: Never used  Substance and  Sexual Activity   Alcohol use: Never   Drug use: Never   Sexual activity: Not Currently    Birth control/protection: I.U.D.  Other Topics Concern   Not on file  Social History Narrative   Not on file   Social Determinants of Health   Financial Resource Strain: Not on file  Food Insecurity: Not on file  Transportation Needs: Not on file  Physical Activity: Not on file  Stress: Not on file  Social Connections: Not on file  Intimate Partner Violence: Not on file    Past Medical History, Surgical history, Social history, and Family history were reviewed and updated as appropriate.   Please see review of systems for further details on the patient's review from  today.   Objective:   Physical Exam:  There were no vitals taken for this visit.  Physical Exam Constitutional:      General: She is not in acute distress. Musculoskeletal:        General: No deformity.  Neurological:     Mental Status: She is alert and oriented to person, place, and time.     Coordination: Coordination normal.  Psychiatric:        Attention and Perception: Attention and perception normal. She does not perceive auditory or visual hallucinations.        Mood and Affect: Mood normal. Mood is not anxious or depressed. Affect is not labile, blunt, angry or inappropriate.        Speech: Speech normal.        Behavior: Behavior normal.        Thought Content: Thought content normal. Thought content is not paranoid or delusional. Thought content does not include homicidal or suicidal ideation. Thought content does not include homicidal or suicidal plan.        Cognition and Memory: Cognition and memory normal.        Judgment: Judgment normal.     Comments: Insight intact     Lab Review:     Component Value Date/Time   NA 141 06/24/2021 1628   NA 139 01/17/2021 0926   K 3.7 06/24/2021 1628   CL 112 (H) 06/24/2021 1628   CO2 24 06/24/2021 1628   GLUCOSE 86 06/24/2021 1628   BUN 7 06/24/2021 1628    BUN 8 01/17/2021 0926   CREATININE 0.62 06/24/2021 1628   CALCIUM 9.3 06/24/2021 1628   PROT 7.5 06/19/2021 0622   PROT 7.3 01/17/2021 0926   ALBUMIN 4.2 06/19/2021 0622   ALBUMIN 4.9 01/17/2021 0926   AST 19 06/19/2021 0622   ALT 14 06/19/2021 0622   ALKPHOS 43 06/19/2021 0622   BILITOT 0.7 06/19/2021 0622   BILITOT 0.3 01/17/2021 0926   GFRNONAA >60 06/24/2021 1628       Component Value Date/Time   WBC 5.9 06/24/2021 1628   RBC 4.65 06/24/2021 1628   HGB 13.3 06/24/2021 1628   HGB 13.0 01/17/2021 0926   HCT 42.1 06/24/2021 1628   HCT 39.1 01/17/2021 0926   PLT 311 06/24/2021 1628   PLT 297 01/17/2021 0926   MCV 90.5 06/24/2021 1628   MCV 85 01/17/2021 0926   MCH 28.6 06/24/2021 1628   MCHC 31.6 06/24/2021 1628   RDW 13.1 06/24/2021 1628   RDW 11.8 01/17/2021 0926   LYMPHSABS 2.0 06/24/2021 1628   LYMPHSABS 1.8 01/17/2021 0926   MONOABS 0.5 06/24/2021 1628   EOSABS 0.1 06/24/2021 1628   EOSABS 0.1 01/17/2021 0926   BASOSABS 0.0 06/24/2021 1628   BASOSABS 0.0 01/17/2021 0926    No results found for: "POCLITH", "LITHIUM"   No results found for: "PHENYTOIN", "PHENOBARB", "VALPROATE", "CBMZ"   .res Assessment: Plan:    Plan:  PDMP reviewed  Increase Zoloft 167m to 1553m daily Clonazepam 0.46m18maily - using for sleep  RTC 2 to 3 months  Patient advised to contact office with any questions, adverse effects, or acute worsening in signs and symptoms.   Discussed potential benefits, risk, and side effects of benzodiazepines to include potential risk of tolerance and dependence, as well as possible drowsiness.  Advised patient not to drive if experiencing drowsiness and to take lowest possible effective dose to minimize risk of dependence and tolerance.   Diagnoses and all orders  for this visit:  Current mild episode of major depressive disorder without prior episode ([place])  Mixed obsessional thoughts and acts  Anxiety about health  Insomnia due to other  mental disorder     Please see After Visit Summary for patient specific instructions.  No future appointments.   No orders of the defined types were placed in this encounter.     -------------------------------

## 2022-02-14 ENCOUNTER — Encounter: Payer: Self-pay | Admitting: *Deleted

## 2022-02-22 ENCOUNTER — Telehealth: Payer: Self-pay | Admitting: Family

## 2022-02-22 NOTE — Telephone Encounter (Signed)
Appointment For: Kristina Blair ([id number])  Visit Type: OFFICE VISIT (1004)    02/26/2022   9:40 AM  20 mins.  Dulce Sellar, NP    [facility]    Patient Comments:  High ketones in urine and severe lower back pain    Patient was contacted- vml for patient to call back and sch sooner

## 2022-02-26 ENCOUNTER — Ambulatory Visit: Payer: No Typology Code available for payment source | Admitting: Family

## 2022-02-28 ENCOUNTER — Emergency Department (HOSPITAL_BASED_OUTPATIENT_CLINIC_OR_DEPARTMENT_OTHER): Payer: No Typology Code available for payment source | Admitting: Radiology

## 2022-02-28 ENCOUNTER — Other Ambulatory Visit: Payer: Self-pay

## 2022-02-28 ENCOUNTER — Emergency Department (HOSPITAL_BASED_OUTPATIENT_CLINIC_OR_DEPARTMENT_OTHER)
Admission: EM | Admit: 2022-02-28 | Discharge: 2022-02-28 | Disposition: A | Discharge status: Home / Self Care | Payer: No Typology Code available for payment source | Attending: Emergency Medicine | Admitting: Emergency Medicine

## 2022-02-28 ENCOUNTER — Encounter (HOSPITAL_BASED_OUTPATIENT_CLINIC_OR_DEPARTMENT_OTHER): Payer: Self-pay | Admitting: Emergency Medicine

## 2022-02-28 DIAGNOSIS — R0602 Shortness of breath: Secondary | ICD-10-CM | POA: Diagnosis present

## 2022-02-28 DIAGNOSIS — M545 Low back pain, unspecified: Secondary | ICD-10-CM | POA: Diagnosis not present

## 2022-02-28 DIAGNOSIS — Z1152 Encounter for screening for COVID-19: Secondary | ICD-10-CM | POA: Diagnosis not present

## 2022-02-28 LAB — BASIC METABOLIC PANEL
Anion gap: 10 (ref 5–15)
BUN: 12 mg/dL (ref 6–20)
CO2: 23 mmol/L (ref 22–32)
Calcium: 9.9 mg/dL (ref 8.9–10.3)
Chloride: 106 mmol/L (ref 98–111)
Creatinine, Ser: 0.77 mg/dL (ref 0.44–1.00)
GFR, Estimated: >60 mL/min (ref >60)
Glucose, Bld: 96 mg/dL (ref 70–99)
Potassium: 4.3 mmol/L (ref 3.5–5.1)
Sodium: 139 mmol/L (ref 135–145)

## 2022-02-28 LAB — HEPATIC FUNCTION PANEL
ALT: 13 U/L (ref 0–44)
AST: 21 U/L (ref 15–41)
Albumin: 4.8 g/dL (ref 3.5–5.0)
Alkaline Phosphatase: 64 U/L (ref 38–126)
Bilirubin, Direct: 0.1 mg/dL (ref 0.0–0.2)
Indirect Bilirubin: 0.1 mg/dL — ABNORMAL LOW (ref 0.3–0.9)
Total Bilirubin: 0.2 mg/dL — ABNORMAL LOW (ref 0.3–1.2)
Total Protein: 8.4 g/dL — ABNORMAL HIGH (ref 6.5–8.1)

## 2022-02-28 LAB — CBC
HCT: 41.4 % (ref 36.0–46.0)
Hemoglobin: 13.2 g/dL (ref 12.0–15.0)
MCH: 28.4 pg (ref 26.0–34.0)
MCHC: 31.9 g/dL (ref 30.0–36.0)
MCV: 89.2 fL (ref 80.0–100.0)
Platelets: 341 10*3/uL (ref 150–400)
RBC: 4.64 MIL/uL (ref 3.87–5.11)
RDW: 12.9 % (ref 11.5–15.5)
WBC: 7.5 10*3/uL (ref 4.0–10.5)
nRBC: 0 % (ref 0.0–0.2)

## 2022-02-28 LAB — PREGNANCY, URINE: Preg Test, Ur: NEGATIVE

## 2022-02-28 LAB — URINALYSIS, ROUTINE W REFLEX MICROSCOPIC
Bilirubin Urine: NEGATIVE
Glucose, UA: NEGATIVE mg/dL
Hgb urine dipstick: NEGATIVE
Ketones, ur: NEGATIVE mg/dL
Leukocytes,Ua: NEGATIVE
Nitrite: NEGATIVE
Specific Gravity, Urine: 1.023 (ref 1.005–1.030)
pH: 6 (ref 5.0–8.0)

## 2022-02-28 LAB — RESP PANEL BY RT-PCR (RSV, FLU A&B, COVID)  RVPGX2
Influenza A by PCR: NEGATIVE
Influenza B by PCR: NEGATIVE
Resp Syncytial Virus by PCR: NEGATIVE
SARS Coronavirus 2 by RT PCR: NEGATIVE

## 2022-02-28 LAB — LIPASE, BLOOD: Lipase: 13 U/L (ref 11–51)

## 2022-02-28 LAB — TROPONIN I (HIGH SENSITIVITY): Troponin I (High Sensitivity): <2 ng/L (ref <18)

## 2022-02-28 MED ORDER — SODIUM CHLORIDE 0.9 % IV BOLUS
1000.0000 mL | Freq: Once | INTRAVENOUS | Status: AC
  Administered 2022-02-28: 1000 mL via INTRAVENOUS

## 2022-02-28 MED ORDER — METHOCARBAMOL 500 MG PO TABS: 500.0000 mg | ORAL_TABLET | Freq: Two times a day (BID) | ORAL | 0 refills | Status: DC

## 2022-02-28 MED ORDER — ACETAMINOPHEN 325 MG PO TABS
650.0000 mg | ORAL_TABLET | Freq: Once | ORAL | Status: AC
  Administered 2022-02-28: 650 mg via ORAL
  Filled 2022-02-28: qty 2

## 2022-02-28 MED ORDER — METHOCARBAMOL 500 MG PO TABS
500.0000 mg | ORAL_TABLET | Freq: Once | ORAL | Status: DC
  Filled 2022-02-28: qty 1

## 2022-02-28 NOTE — ED Notes (Signed)
Pt awake and alert; GCS 15.  RR even and unlabored on RA with symmetrical rise and fall of chest.  O2 sats 100% on RA.  Pt does however continue to report continuous sob.  Denies pain at this time.  Abdomen soft, flat, nontender.  Skin warm dry and intact.  Will continue to monitor for acute changes and maintain plan of care as pt awaits ED provider re-eval/dispo

## 2022-02-28 NOTE — ED Provider Notes (Signed)
[facility] EMERGENCY DEPT Provider Note   CSN: [id number] Arrival date & time: 02/28/22  1442     History  Chief Complaint  Patient presents with   Shortness of Breath    Kristina Blair is a 26 y.o. female with a past medical history significant for prediabetes, vitamin D deficiency, anxiety, and depression who presents to the ED due to bilateral low back pain that has been persistent for the past 4 to 5 days.  She admits to some dysuria.  She also admits to waking up this morning with shortness of breath and diaphoresis.  Patient states she felt like something was sitting on her chest. Patient was evaluated by PCP prior to arrival and found to have ketones in her urine and sent to the ED for further evaluation.  Patient admits to polydipsia.  Also endorses some blurry vision.  Family history of type I and type 2 diabetes.  Denies injury to low back.  Denies saddle anesthesia, bowel/bladder incontinence, lower extremity numbness/pain, lower extremity weakness.  Denies vaginal discharge.  Has not been sexually active in roughly 5 years.  No concern for STIs.  History obtained from patient and past medical records. No interpreter used during encounter.       Home Medications Prior to Admission medications   Medication Sig Start Date End Date Taking? Authorizing Provider  methocarbamol (ROBAXIN) 500 MG tablet Take 1 tablet (500 mg total) by mouth 2 (two) times daily. 02/28/22  Yes Kynan Peasley, Druscilla Brownie, PA-C  albuterol (VENTOLIN HFA) 108 (90 Base) MCG/ACT inhaler     [provider]  clindamycin-tretinoin Pershing Proud) gel Apply topically at bedtime. 10/17/21   Jeanie Sewer, NP  clonazePAM (KLONOPIN) 0.5 MG tablet Take 1 tablet (0.5 mg total) by mouth at bedtime. 01/29/22   Mozingo, Berdie Ogren, NP  doxycycline (VIBRA-TABS) 100 MG tablet Take 1 tablet (100 mg total) by mouth 2 (two) times daily. 10/17/21   Jeanie Sewer, NP  levonorgestrel (KYLEENA) 19.5 MG IUD 1  each by Intrauterine route once.    [provider]  Multiple Vitamins-Calcium (ONE-A-DAY WOMENS FORMULA) TABS Take 1 tablet by mouth daily with breakfast.    [provider]  naproxen (NAPROSYN) 500 MG tablet Take 1 tablet (500 mg total) by mouth 2 (two) times daily with a meal. 07/26/21   Jeanie Sewer, NP  sertraline (ZOLOFT) 100 MG tablet Take one and 1/2 tablets daily. 01/29/22   Mozingo, Berdie Ogren, NP  SUMAtriptan (IMITREX) 50 MG tablet     [provider]      Allergies    Kiwi extract and Reglan [metoclopramide]    Review of Systems   Review of Systems  Constitutional:  Positive for diaphoresis. Negative for chills and fever.  Respiratory:  Positive for shortness of breath.   Cardiovascular:  Positive for chest pain.  Gastrointestinal:  Positive for abdominal pain. Negative for diarrhea, nausea and vomiting.  Genitourinary:  Positive for difficulty urinating and dysuria.  Musculoskeletal:  Positive for back pain.  All other systems reviewed and are negative.   Physical Exam Updated Vital Signs BP 119/72   Pulse 71   Temp 98.4 F (36.9 C) (Oral)   Resp 18   Ht 5\' 1"  (1.549 m)   Wt 55.8 kg   LMP 02/09/2022 (Approximate)   SpO2 100%   BMI 23.24 kg/m  Physical Exam Vitals and nursing note reviewed.  Constitutional:      General: She is not in acute distress.    Appearance: She  is not ill-appearing.  HENT:     Head: Normocephalic.  Eyes:     Pupils: Pupils are equal, round, and reactive to light.  Cardiovascular:     Rate and Rhythm: Normal rate and regular rhythm.     Pulses: Normal pulses.     Heart sounds: Normal heart sounds. No murmur heard.    No friction rub. No gallop.  Pulmonary:     Effort: Pulmonary effort is normal.     Breath sounds: Normal breath sounds.  Abdominal:     General: Abdomen is flat. There is no distension.     Palpations: Abdomen is soft.     Tenderness: There is no abdominal tenderness. There is  no guarding or rebound.  Musculoskeletal:        General: Normal range of motion.     Cervical back: Neck supple.  Skin:    General: Skin is warm and dry.  Neurological:     General: No focal deficit present.     Mental Status: She is alert.  Psychiatric:        Mood and Affect: Mood normal.        Behavior: Behavior normal.     ED Results / Procedures / Treatments   Labs (all labs ordered are listed, but only abnormal results are displayed) Labs Reviewed  URINALYSIS, ROUTINE W REFLEX MICROSCOPIC - Abnormal; Notable for the following components:      Result Value   Protein, ur TRACE (*)    All other components within normal limits  HEPATIC FUNCTION PANEL - Abnormal; Notable for the following components:   Total Protein 8.4 (*)    Total Bilirubin 0.2 (*)    Indirect Bilirubin 0.1 (*)    All other components within normal limits  RESP PANEL BY RT-PCR (RSV, FLU A&B, COVID)  RVPGX2  PREGNANCY, URINE  BASIC METABOLIC PANEL  CBC  LIPASE, BLOOD  TROPONIN I (HIGH SENSITIVITY)    EKG None  Radiology DG Chest 2 View  Result Date: 02/28/2022 CLINICAL DATA:  Shortness of breath. EXAM: CHEST - 2 VIEW COMPARISON:  Chest x-ray June 19, 2021. FINDINGS: The heart size and mediastinal contours are within normal limits. Both lungs are clear. No visible pleural effusions or pneumothorax. No acute osseous abnormality. IMPRESSION: No active cardiopulmonary disease. Electronically Signed   By: Feliberto Harts M.D.   On: 02/28/2022 15:46    Procedures Procedures    Medications Ordered in ED Medications  methocarbamol (ROBAXIN) tablet 500 mg (500 mg Oral Patient Refused/Not Given 02/28/22 2011)  sodium chloride 0.9 % bolus 1,000 mL (0 mLs Intravenous Stopped 02/28/22 1926)  acetaminophen (TYLENOL) tablet 650 mg (650 mg Oral Given 02/28/22 1818)    ED Course/ Medical Decision Making/ A&P                           Medical Decision Making Amount and/or Complexity of Data  Reviewed Independent Historian: caregiver    Details: Grandmother at bedside Labs: ordered. Decision-making details documented in ED Course. Radiology: ordered and independent interpretation performed. Decision-making details documented in ED Course.  Risk OTC drugs. Prescription drug management.   This patient presents to the ED for concern of back pain, this involves an extensive number of treatment options, and is a complaint that carries with it a high risk of complications and morbidity.  The differential diagnosis includes muscle strain, bony fracture, cauda equina, pyelonephritis, etc  26 year old female presents to the ED  due to bilateral low back pain for the past 4 to 5 days.  Also endorses some difficulties urinating.  Patient states she woke up this morning and had shortness of breath and felt like something was sitting on her chest.  Patient was evaluated PCP prior to arrival and sent to the ED due to ketonuria.  No history of diabetes however, has been diagnosed with prediabetes.  Upon arrival, vitals all within normal limits.  Patient in no acute distress.  Reassuring physical exam.  No thoracic or lumbar midline tenderness.  Bilateral lower extremities neurovascularly intact.  Low suspicion for cauda equina or central cord compression.  Abdomen soft, nondistended, nontender.  Routine labs ordered.  Cardiac labs ordered to rule out cardiac etiology of chest pain.  Chest x-ray to rule out pneumonia.  Respiratory panel ordered.  IV fluids given. Low suspicion for PE/DVT.  CBC unremarkable.  No leukocytosis.  Normal hemoglobin.  BMP reassuring.  Normal renal function.  No major electrolyte derangements.  Lipase normal at 13.  Doubt pancreatitis.  Urine pregnancy test negative.  UA significant for proteinuria.  No ketones.  No evidence of infection.  Low suspicion for pyelonephritis.  COVID/influenza negative.  Troponin normal.  Low suspicion for cardiac etiology of chest pain and shortness  of breath.  Chest x-ray personally reviewed and interpreted which are negative for signs of pneumonia, pneumothorax, widened mediastinum. EKG demonstrates sinus tachycardia. Patient notes she felt anxious upon presentation. Offered to draw d-dimer to rule out PE, but patient declined which I find to be reasonable given my suspicion for PE is low.  It appears patient has been worked up previously for tachycardia in the ED with reassuring workup.  7:59 PM reassessed patient at bedside.  She admits to continued back pain.  Patient states while she was lying in bed she felt like she was having back spasms.  Robaxin given.  Workup reassuring.  Will discharge patient with Robaxin.  Advised patient take over-the-counter ibuprofen or Tylenol as needed for pain.  Instructed patient to follow-up with PCP as it is not improved the next few days. Strict ED precautions discussed with patient. Patient states understanding and agrees to plan. Patient discharged home in no acute distress and stable vitals.  Has PCP Hx of anxiety       Final Clinical Impression(s) / ED Diagnoses Final diagnoses:  Shortness of breath  Acute bilateral low back pain without sciatica    Rx / DC Orders ED Discharge Orders          Ordered    methocarbamol (ROBAXIN) 500 MG tablet  2 times daily        02/28/22 2016              Karie Kirks 02/28/22 2018    Deno Etienne, DO 02/28/22 2221

## 2022-02-28 NOTE — Discharge Instructions (Signed)
It was a pleasure taking care of you today.  As discussed, your workup was reassuring.  I am sending him with a muscle relaxer for your low back pain.  Take as needed for pain.  Muscle relaxer can cause drowsiness so do not drive or operate machinery while the medication.  Please follow-up with PCP if symptoms do not improve over the next few days.  Return to the ER for any worsening symptoms.

## 2022-02-28 NOTE — ED Notes (Signed)
Discharge paperwork given and verbally understood. 

## 2022-02-28 NOTE — ED Triage Notes (Signed)
Seen by pcp for back pain and sob. PCP checked urine and found high levels of ketones. Family history of dm1 and dm2

## 2022-03-07 ENCOUNTER — Telehealth: Payer: Self-pay | Admitting: Adult Health

## 2022-03-07 ENCOUNTER — Encounter: Payer: Self-pay | Admitting: Adult Health

## 2022-03-07 ENCOUNTER — Telehealth (INDEPENDENT_AMBULATORY_CARE_PROVIDER_SITE_OTHER): Payer: No Typology Code available for payment source | Admitting: Adult Health

## 2022-03-07 DIAGNOSIS — F5105 Insomnia due to other mental disorder: Secondary | ICD-10-CM

## 2022-03-07 DIAGNOSIS — F422 Mixed obsessional thoughts and acts: Secondary | ICD-10-CM

## 2022-03-07 DIAGNOSIS — F320 Major depressive disorder, single episode, mild: Secondary | ICD-10-CM | POA: Diagnosis not present

## 2022-03-07 DIAGNOSIS — F418 Other specified anxiety disorders: Secondary | ICD-10-CM | POA: Diagnosis not present

## 2022-03-07 MED ORDER — TEMAZEPAM 15 MG PO CAPS: ORAL_CAPSULE | ORAL | 0 refills | Status: DC

## 2022-03-07 MED ORDER — SERTRALINE HCL 100 MG PO TABS: ORAL_TABLET | ORAL | 2 refills | Status: DC

## 2022-03-07 NOTE — Telephone Encounter (Signed)
Noted. Ty!

## 2022-03-07 NOTE — Progress Notes (Signed)
Harlyn Rathmann [id number] 01/19/1996 27 y.o.  Subjective:   Patient ID:  Kristina Blair is a 27 y.o. (DOB 1995/04/13) female.  Chief Complaint: No chief complaint on file.   HPI Kristina Blair presents to the office today for follow-up of anxiety about health, insomnia, mixed obsessional thoughts and acts, and current mild depression.  Describes mood today as "not good". Pleasant. Tearful at times. Mood symptoms - reports increased depression and irritability. Unable to identify a precipitant. Feels like she is worrying about "everything". Feels overwhelmed. Increased anxiety. Reports increased panic attacks - recently seen in the ED for evaluation.  Reports increased obsessive thoughts. Reports nausea and vomiting. Having to hold her hands - can't sit still. Stating "I don't feel like I am able to work right now". Does not want to lose her job, but needs to take some time off and get some relief. Employer has been supportive. Has been taking her medications as prescribed, but does not feel like it is helping - willing to consider other options. Decreased interest and motivation. Taking medications as prescribed.  Energy levels lower. Active, does not have a regular exercise routine.   Unable to enjoy usual interests and activities. Single. Lives with grandmother and 1 dog. Spending time with family and friends. Appetite decreased. Weight stable. Reports increased sleep issues. Averages 2 to 3 hours - with Clonazepam. Focus and concentration difficulties. Completing minimal tasks. Managing minimal aspects of household. Works full time for [employer] - out of work since 03/01/2022. Denies SI or HI.  Denies AH or VH. Denies self harm. Denies substance use.  Previous medication trials: Zoloft, Prozac, Lamictal, Wellbutrin - helpful, various other medication    GAD-7    Flowsheet Row Office Visit from 06/28/2021 in [facility]  Total GAD-7 Score 21      PHQ2-9     [facility] Visit from 10/17/2021 in [facility] Visit from 06/28/2021 in [facility] Visit from 05/30/2021 in [facility] Visit from 04/04/2021 in [facility] Visit from 01/17/2021 in [facility]  PHQ-2 Total Score 2 4 6  0 4  PHQ-9 Total Score 4 17 17  -- [facility] from 02/28/2022 in [facility] ED from 06/19/2021 in [facility] ED from 06/16/2021 in [facility] No Risk No Risk No Risk        Review of Systems:  Review of Systems  Musculoskeletal:  Negative for gait problem.  Neurological:  Negative for tremors.  Psychiatric/Behavioral:         Please refer to HPI    Medications: I have reviewed the patient's current medications.  Current Outpatient Medications  Medication Sig Dispense Refill   albuterol (VENTOLIN HFA) 108 (90 Base) MCG/ACT inhaler      clindamycin-tretinoin (ZIANA) gel Apply topically at bedtime. 30 g 0   doxycycline (VIBRA-TABS) 100 MG tablet Take 1 tablet (100 mg total) by mouth 2 (two) times daily. 28 tablet 0   levonorgestrel (KYLEENA) 19.5 MG IUD 1 each by Intrauterine route once.     methocarbamol (ROBAXIN) 500 MG tablet Take 1 tablet (500 mg total) by mouth 2 (two) times daily. 20 tablet 0   Multiple Vitamins-Calcium (ONE-A-DAY WOMENS FORMULA) TABS Take 1 tablet by mouth daily with breakfast.  naproxen (NAPROSYN) 500 MG tablet Take 1 tablet (500 mg total) by mouth 2 (two) times daily with a meal. 30 tablet 0   sertraline (ZOLOFT) 100 MG tablet Take one and 1/2 tablets daily. 45 tablet 2   SUMAtriptan (IMITREX) 50 MG tablet  (Patient not taking: Reported on 10/17/2021)     No current facility-administered medications for this  visit.    Medication Side Effects: None  Allergies:  Allergies  Allergen Reactions   Kiwi Extract Itching and Other (See Comments)    Tongue itching   Reglan [Metoclopramide] Anxiety    Past Medical History:  Diagnosis Date   Abnormal weight loss 06/18/2021   Allergy    Anxiety    ASCUS of cervix with negative high risk HPV    Asthma    Phreesia 02/13/2020   Generalized anxiety disorder 06/28/2021   GERD (gastroesophageal reflux disease)    Phreesia 02/13/2020   IUD (intrauterine device) in place 09/2019   liletta   Malnutrition of moderate degree 06/11/2021   Paroxysmal SVT (supraventricular tachycardia)     Past Medical History, Surgical history, Social history, and Family history were reviewed and updated as appropriate.   Please see review of systems for further details on the patient's review from today.   Objective:   Physical Exam:  LMP 02/09/2022 (Approximate)   Physical Exam Constitutional:      General: She is not in acute distress. Musculoskeletal:        General: No deformity.  Neurological:     Mental Status: She is alert and oriented to person, place, and time.     Coordination: Coordination normal.  Psychiatric:        Attention and Perception: Attention and perception normal. She does not perceive auditory or visual hallucinations.        Mood and Affect: Mood normal. Mood is not anxious or depressed. Affect is not labile, blunt, angry or inappropriate.        Speech: Speech normal.        Behavior: Behavior normal.        Thought Content: Thought content normal. Thought content is not paranoid or delusional. Thought content does not include homicidal or suicidal ideation. Thought content does not include homicidal or suicidal plan.        Cognition and Memory: Cognition and memory normal.        Judgment: Judgment normal.     Comments: Insight intact     Lab Review:     Component Value Date/Time   NA 139 02/28/2022 1504   NA 139  01/17/2021 0926   K 4.3 02/28/2022 1504   CL 106 02/28/2022 1504   CO2 23 02/28/2022 1504   GLUCOSE 96 02/28/2022 1504   BUN 12 02/28/2022 1504   BUN 8 01/17/2021 0926   CREATININE 0.77 02/28/2022 1504   CALCIUM 9.9 02/28/2022 1504   PROT 8.4 (H) 02/28/2022 1504   PROT 7.3 01/17/2021 0926   ALBUMIN 4.8 02/28/2022 1504   ALBUMIN 4.9 01/17/2021 0926   AST 21 02/28/2022 1504   ALT 13 02/28/2022 1504   ALKPHOS 64 02/28/2022 1504   BILITOT 0.2 (L) 02/28/2022 1504   BILITOT 0.3 01/17/2021 0926   GFRNONAA >60 02/28/2022 1504       Component Value Date/Time   WBC 7.5 02/28/2022 1504   RBC 4.64 02/28/2022 1504   HGB 13.2 02/28/2022 1504   HGB 13.0 01/17/2021 0926   HCT 41.4 02/28/2022 1504   HCT 39.1 01/17/2021 0926  PLT 341 02/28/2022 1504   PLT 297 01/17/2021 0926   MCV 89.2 02/28/2022 1504   MCV 85 01/17/2021 0926   MCH 28.4 02/28/2022 1504   MCHC 31.9 02/28/2022 1504   RDW 12.9 02/28/2022 1504   RDW 11.8 01/17/2021 0926   LYMPHSABS 2.0 06/24/2021 1628   LYMPHSABS 1.8 01/17/2021 0926   MONOABS 0.5 06/24/2021 1628   EOSABS 0.1 06/24/2021 1628   EOSABS 0.1 01/17/2021 0926   BASOSABS 0.0 06/24/2021 1628   BASOSABS 0.0 01/17/2021 0926    No results found for: "POCLITH", "LITHIUM"   No results found for: "PHENYTOIN", "PHENOBARB", "VALPROATE", "CBMZ"   .res Assessment: Plan:    Plan:  PDMP reviewed  Increase Zoloft 150mg  to 200mg  daily Add Temazepam - take 1 to 2 at bedtime as needed for sleep.  D/C Clonazepam 0.5mg  daily - using for sleep.  Has been unable to work since 03/01/2022. Still unable to work at this will - continue leave of absence for the next 3 weeks - through 03/28/2022. Will make an appt for 03/27/2022.  Plan to set a therapist.  RTC 3 weeks  Plans   Patient advised to contact office with any questions, adverse effects, or acute worsening in signs and symptoms.   Discussed potential benefits, risk, and side effects of benzodiazepines to  include potential risk of tolerance and dependence, as well as possible drowsiness.  Advised patient not to drive if experiencing drowsiness and to take lowest possible effective dose to minimize risk of dependence and tolerance.    Diagnoses and all orders for this visit:  Insomnia due to other mental disorder  Anxiety about health  Mixed obsessional thoughts and acts  Current mild episode of major depressive disorder without prior episode [facility])     Please see After Visit Summary for patient specific instructions.  Future Appointments  Date Time Provider [place]  03/25/2022  9:30 AM Melvenia Beam, MD [facility] None  04/04/2022 10:00 AM Homar Weinkauf, Berdie Ogren, NP [facility] None    No orders of the defined types were placed in this encounter.   -------------------------------

## 2022-03-07 NOTE — Telephone Encounter (Signed)
Received via fax  STD & FMLA forms to complete. Placed in Traci's box

## 2022-03-11 ENCOUNTER — Telehealth: Payer: Self-pay | Admitting: Adult Health

## 2022-03-11 NOTE — Telephone Encounter (Signed)
Received fax from [facility] for completion of Short Term Disability Form. Placed in Traci's box to complete.

## 2022-03-12 ENCOUNTER — Telehealth: Payer: Self-pay | Admitting: Adult Health

## 2022-03-12 ENCOUNTER — Ambulatory Visit: Payer: No Typology Code available for payment source | Admitting: Behavioral Health

## 2022-03-12 DIAGNOSIS — Z0289 Encounter for other administrative examinations: Secondary | ICD-10-CM

## 2022-03-12 MED ORDER — TRAZODONE HCL 50 MG PO TABS: 50.0000 mg | ORAL_TABLET | Freq: Every day | ORAL | 0 refills | Status: DC

## 2022-03-12 NOTE — Telephone Encounter (Signed)
LVM to [facility] 

## 2022-03-12 NOTE — Telephone Encounter (Signed)
No history of trazodone or Seroquel in Epic. She was on amitriptyline 10 mg for a couple of months by Neurology. I didn't see note with reason for stopping.

## 2022-03-12 NOTE — Telephone Encounter (Signed)
Patient said she is taking 2 temazepam and is not sleeping. She is not able to get to sleep sometimes and is not able to stay asleep. She denies caffeine use. Routine bedtime, no electronics.

## 2022-03-12 NOTE — Telephone Encounter (Signed)
Can we review chart and see if she has tried Trazadone or Seroquel? Clonazepam was helping until recently and Temazepam was added.

## 2022-03-12 NOTE — Telephone Encounter (Signed)
Pt LVM @ 7:57a.  She had to cancel appt today with [facility] because she hasn't slept in 2 days.  She said the medicine that Gina prescribed for her hasn't helped at all.  Pls call her back to discuss.  Next appt 1/24

## 2022-03-12 NOTE — Telephone Encounter (Signed)
Paperwork completed and faxed.  °

## 2022-03-12 NOTE — Telephone Encounter (Signed)
We can suggest Trazadone 50mg  at hs.

## 2022-03-12 NOTE — Telephone Encounter (Signed)
Patient willing to try trazodone. Rx sent to requested pharmacy.

## 2022-03-15 ENCOUNTER — Ambulatory Visit (INDEPENDENT_AMBULATORY_CARE_PROVIDER_SITE_OTHER): Payer: No Typology Code available for payment source | Admitting: Behavioral Health

## 2022-03-15 DIAGNOSIS — F422 Mixed obsessional thoughts and acts: Secondary | ICD-10-CM

## 2022-03-15 DIAGNOSIS — F418 Other specified anxiety disorders: Secondary | ICD-10-CM
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
## 2022-03-20 ENCOUNTER — Telehealth: Payer: Self-pay | Admitting: Adult Health

## 2022-03-20 ENCOUNTER — Encounter: Payer: Self-pay | Admitting: Behavioral Health

## 2022-03-20 NOTE — Telephone Encounter (Signed)
She did not have any issues on the 150mg  - may need more time. The Zoloft can be activating. She can go ahead and decrease it back to the 150mg  dose and see if symptoms improve. She can also go to ED if she feels like she has serotonin syndrome.

## 2022-03-20 NOTE — Telephone Encounter (Signed)
Patient has not increased her Zoloft, still taking 150 mg. She said the pharmacy told her that trazodone with the Zoloft could cause SS. She said she is shaking like she is cold, but isn't cold, and she has a headache. She said it occurs all day, not just at night.   She asked if it was the trazodone in combination with the Zoloft causing the symptoms, said it never happened on clonazepam, so I suspect she meant temazepam and not trazodone.

## 2022-03-20 NOTE — Telephone Encounter (Signed)
Pt stated on 1/4 sertraline was increased to 200 mg daily.Since then she has been jittery/shaking all over.She stated the increase has not helped and only made things worse since shaking was a symptom of her anxiety already.

## 2022-03-20 NOTE — Telephone Encounter (Signed)
Pt LVM @ 10:30a.  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  She feels very "jittery" wants to know if maybe has had too much medicine and need stop taking something.  Next appt 1/24

## 2022-03-21 ENCOUNTER — Ambulatory Visit: Payer: No Typology Code available for payment source | Admitting: Behavioral Health

## 2022-03-21 NOTE — Telephone Encounter (Signed)
Noted. Ty!

## 2022-03-21 NOTE — Telephone Encounter (Signed)
Left another VM to RC.  

## 2022-03-21 NOTE — Telephone Encounter (Signed)
Patient said she was taking the trazodone and the temazepam, but not at the same time. She stopped the trazodone because she felt like that was what was causing her sx with the sertraline. She said there was a virus going through the hospital and she didn't want to go. She said she is feeling better today.

## 2022-03-21 NOTE — Telephone Encounter (Signed)
Can we follow up with her again this morning. I do not see she went to ED.

## 2022-03-21 NOTE — Telephone Encounter (Signed)
LVM to [facility] 

## 2022-03-22 ENCOUNTER — Ambulatory Visit: Payer: No Typology Code available for payment source | Admitting: Family

## 2022-03-25 ENCOUNTER — Ambulatory Visit: Payer: No Typology Code available for payment source | Admitting: Neurology

## 2022-03-25 ENCOUNTER — Encounter: Payer: Self-pay | Admitting: Neurology

## 2022-03-27 ENCOUNTER — Encounter: Payer: Self-pay | Admitting: Adult Health

## 2022-03-27 ENCOUNTER — Telehealth: Payer: No Typology Code available for payment source | Admitting: Adult Health

## 2022-03-27 DIAGNOSIS — F5105 Insomnia due to other mental disorder: Secondary | ICD-10-CM | POA: Diagnosis not present

## 2022-03-27 DIAGNOSIS — F330 Major depressive disorder, recurrent, mild: Secondary | ICD-10-CM

## 2022-03-27 MED ORDER — TEMAZEPAM 15 MG PO CAPS: ORAL_CAPSULE | ORAL | 0 refills | Status: DC

## 2022-03-27 NOTE — Progress Notes (Signed)
Reha Martinovich [id number] Nov 07, 1995 26 y.o.  Virtual Visit via Video Note  I connected with pt @ on 03/27/22 at  3:40 PM EST by a video enabled telemedicine application and verified that I am speaking with the correct person using two identifiers.   I discussed the limitations of evaluation and management by telemedicine and the availability of in person appointments. The patient expressed understanding and agreed to proceed.  I discussed the assessment and treatment plan with the patient. The patient was provided an opportunity to ask questions and all were answered. The patient agreed with the plan and demonstrated an understanding of the instructions.   The patient was advised to call back or seek an in-person evaluation if the symptoms worsen or if the condition fails to improve as anticipated.  I provided 25 minutes of non-face-to-face time during this encounter.  The patient was located at home.  The provider was located at [place].   Aloha Gell, NP   Subjective:   Patient ID:  Kristina Blair is a 27 y.o. (DOB [birth year]) female.  Chief Complaint: No chief complaint on file.   HPI Kristina Blair presents for follow-up of anxiety about health, insomnia, mixed obsessional thoughts and acts, and current mild depression.  Describes mood today as "not any better". Pleasant. Tearful at times. Mood symptoms - reports  depression, anxiety, and irritability. Reports worry, rumination, and over thinking. Reports obsessive thoughts. Reports panic attacks. Stating "I don't feel any better". Recently seen in the ED for possible medication reaction with the Zoloft and Trazadone. She has stopped the Trazadone. Is also willing to taper off the Zoloft since she hasn't had any benefit and consider other options. Decreased interest and motivation. Taking medications as prescribed.  Energy levels lower. Active, does not have a regular exercise routine.   Unable to enjoy usual interests and  activities. Single. Lives with grandmother and 1 dog. Spending time with family and friends. Appetite decreased. Weight stable. Reports ongoing sleep issues. Averages 4 hours - with Temazepam Focus and concentration difficulties. Completing minimal tasks. Managing minimal aspects of household. Works full time for [employer] - out of work since 03/01/2022. Denies SI or HI.  Denies AH or VH. Denies self harm. Denies substance use.  Previous medication trials: Zoloft, Prozac, Lamictal, Trazadone, Temazepam, Wellbutrin - helpful, various other medication     Review of Systems:  Review of Systems  Musculoskeletal:  Negative for gait problem.  Neurological:  Negative for tremors.  Psychiatric/Behavioral:         Please refer to HPI    Medications: I have reviewed the patient's current medications.  Current Outpatient Medications  Medication Sig Dispense Refill   albuterol (VENTOLIN HFA) 108 (90 Base) MCG/ACT inhaler      clindamycin-tretinoin (ZIANA) gel Apply topically at bedtime. 30 g 0   doxycycline (VIBRA-TABS) 100 MG tablet Take 1 tablet (100 mg total) by mouth 2 (two) times daily. 28 tablet 0   levonorgestrel (KYLEENA) 19.5 MG IUD 1 each by Intrauterine route once.     methocarbamol (ROBAXIN) 500 MG tablet Take 1 tablet (500 mg total) by mouth 2 (two) times daily. 20 tablet 0   Multiple Vitamins-Calcium (ONE-A-DAY WOMENS FORMULA) TABS Take 1 tablet by mouth daily with breakfast.     naproxen (NAPROSYN) 500 MG tablet Take 1 tablet (500 mg total) by mouth 2 (two) times daily with a meal. 30 tablet 0   sertraline (ZOLOFT) 100 MG tablet Take two tablets daily. [place]  tablet 2   SUMAtriptan (IMITREX) 50 MG tablet  (Patient not taking: Reported on 10/17/2021)     temazepam (RESTORIL) 15 MG capsule Take one to two capsules at bedtime as needed for sleep. 30 capsule 0   traZODone (DESYREL) 50 MG tablet Take 1 tablet (50 mg total) by mouth at bedtime. 30 tablet 0   No current  facility-administered medications for this visit.    Medication Side Effects: None  Allergies:  Allergies  Allergen Reactions   Kiwi Extract Itching and Other (See Comments)    Tongue itching   Reglan [Metoclopramide] Anxiety    Past Medical History:  Diagnosis Date   Abnormal weight loss 06/18/2021   Allergy    Anxiety    ASCUS of cervix with negative high risk HPV    Asthma    Phreesia 02/13/2020   Generalized anxiety disorder 06/28/2021   GERD (gastroesophageal reflux disease)    Phreesia 02/13/2020   IUD (intrauterine device) in place 09/2019   liletta   Malnutrition of moderate degree 06/11/2021   Paroxysmal SVT (supraventricular tachycardia)     Family History  Problem Relation Age of Onset   Diabetes Mother    Hypertension Mother    Breast cancer Mother 58       recurrence age 72, reports BRCA1+   Early death Mother    Hyperlipidemia Mother    Miscarriages / Stillbirths Mother    Obesity Mother    Alcohol abuse Mother    Cancer Mother    Bipolar disorder Father    Cerebral palsy Brother    Epilepsy Brother    ADD / ADHD Brother    Cancer Maternal Aunt 30       died at 72, maternal great aunt   Asthma Maternal Aunt    Diabetes Maternal Grandmother    Heart disease Maternal Grandmother    Hyperlipidemia Maternal Grandmother    Hypertension Maternal Grandmother    Arthritis Maternal Grandmother    COPD Maternal Grandmother    Hearing loss Maternal Grandmother    Leukemia Maternal Grandfather        dx. childhood   Early death Maternal Grandfather    Colon cancer Maternal Grandfather 32   Prostate cancer Paternal Grandfather        metastatic   Ovarian cancer Maternal Great-grandmother 45   Esophageal cancer Neg Hx    Pancreatic cancer Neg Hx    Rectal cancer Neg Hx    Stomach cancer Neg Hx    Liver cancer Neg Hx     Social History   Socioeconomic History   Marital status: Single    Spouse name: Not on file   Number of children: Not on  file   Years of education: Not on file   Highest education level: Not on file  Occupational History   Not on file  Tobacco Use   Smoking status: Never   Smokeless tobacco: Never  Vaping Use   Vaping Use: Never used  Substance and Sexual Activity   Alcohol use: Never   Drug use: Never   Sexual activity: Not Currently    Birth control/protection: I.U.D.  Other Topics Concern   Not on file  Social History Narrative   Not on file   Social Determinants of Health   Financial Resource Strain: Not on file  Food Insecurity: Not on file  Transportation Needs: Not on file  Physical Activity: Not on file  Stress: Not on file  Social Connections: Not on file  Intimate Partner Violence: Not on file    Past Medical History, Surgical history, Social history, and Family history were reviewed and updated as appropriate.   Please see review of systems for further details on the patient's review from today.   Objective:   Physical Exam:  LMP 02/09/2022 (Approximate)   Physical Exam  Lab Review:     Component Value Date/Time   NA 139 02/28/2022 1504   NA 139 01/17/2021 0926   K 4.3 02/28/2022 1504   CL 106 02/28/2022 1504   CO2 23 02/28/2022 1504   GLUCOSE 96 02/28/2022 1504   BUN 12 02/28/2022 1504   BUN 8 01/17/2021 0926   CREATININE 0.77 02/28/2022 1504   CALCIUM 9.9 02/28/2022 1504   PROT 8.4 (H) 02/28/2022 1504   PROT 7.3 01/17/2021 0926   ALBUMIN 4.8 02/28/2022 1504   ALBUMIN 4.9 01/17/2021 0926   AST 21 02/28/2022 1504   ALT 13 02/28/2022 1504   ALKPHOS 64 02/28/2022 1504   BILITOT 0.2 (L) 02/28/2022 1504   BILITOT 0.3 01/17/2021 0926   GFRNONAA >60 02/28/2022 1504       Component Value Date/Time   WBC 7.5 02/28/2022 1504   RBC 4.64 02/28/2022 1504   HGB 13.2 02/28/2022 1504   HGB 13.0 01/17/2021 0926   HCT 41.4 02/28/2022 1504   HCT 39.1 01/17/2021 0926   PLT 341 02/28/2022 1504   PLT 297 01/17/2021 0926   MCV 89.2 02/28/2022 1504   MCV 85 01/17/2021  0926   MCH 28.4 02/28/2022 1504   MCHC 31.9 02/28/2022 1504   RDW 12.9 02/28/2022 1504   RDW 11.8 01/17/2021 0926   LYMPHSABS 2.0 06/24/2021 1628   LYMPHSABS 1.8 01/17/2021 0926   MONOABS 0.5 06/24/2021 1628   EOSABS 0.1 06/24/2021 1628   EOSABS 0.1 01/17/2021 0926   BASOSABS 0.0 06/24/2021 1628   BASOSABS 0.0 01/17/2021 0926    No results found for: "POCLITH", "LITHIUM"   No results found for: "PHENYTOIN", "PHENOBARB", "VALPROATE", "CBMZ"   .res Assessment: Plan:    Plan:  PDMP reviewed  Decrease Zoloft 50mg  daily x 7 days, then leave off.   Initiate Gene sight testing before starting a new medication  Temazepam - take 1 to 2 at bedtime as needed for sleep.  Has been unable to work since 02/28/2022. Still unable to work at this time - will continue leave of absence for the next 4 weeks - through 05/01/2022. Will make an appt for 05/01/2022.  Plan to set a therapist - trauma  RTC 2 weeks  Plans   Patient advised to contact office with any questions, adverse effects, or acute worsening in signs and symptoms.   Discussed potential benefits, risk, and side effects of benzodiazepines to include potential risk of tolerance and dependence, as well as possible drowsiness.  Advised patient not to drive if experiencing drowsiness and to take lowest possible effective dose to minimize risk of dependence and tolerance.   There are no diagnoses linked to this encounter.   Please see After Visit Summary for patient specific instructions.  Future Appointments  Date Time Provider [place]  03/27/2022  3:40 PM Aeon Koors, Berdie Ogren, NP [facility] None  04/04/2022 10:00 AM Vernel Langenderfer, Berdie Ogren, NP [facility] None    No orders of the defined types were placed in this encounter.     -------------------------------

## 2022-03-29 ENCOUNTER — Telehealth: Payer: Self-pay | Admitting: Adult Health

## 2022-03-29 NOTE — Telephone Encounter (Signed)
Received STD paperwork. Given to [facility] 03/29/22

## 2022-04-04 ENCOUNTER — Telehealth: Payer: No Typology Code available for payment source | Admitting: Adult Health

## 2022-04-05 ENCOUNTER — Telehealth: Payer: Self-pay | Admitting: Adult Health

## 2022-04-05 NOTE — Telephone Encounter (Signed)
Paper work completed and given to [place] to review and sign

## 2022-04-05 NOTE — Telephone Encounter (Signed)
Received FMLA form from [employer] LOA Dept re: [name]. Placed in Traci's box to complete.

## 2022-04-08 ENCOUNTER — Telehealth: Payer: Self-pay

## 2022-04-08 NOTE — Telephone Encounter (Signed)
May need to go to an urgent care for evaluation.

## 2022-04-08 NOTE — Telephone Encounter (Signed)
Pt called to report she is having increased anxiety, unable to eat, profuse sweating during naps/sleep, muscle fatigue and weakness for 3-7 days. She reports not taking any medication. I asked if she got her Gene Sight test and she reports no one contacted her about that?   Let her know I would give symptoms to [facility] but without her taking medication currently I am not sure where these symptoms are coming from.

## 2022-04-08 NOTE — Telephone Encounter (Signed)
Paper work has been faxed

## 2022-04-09 ENCOUNTER — Telehealth: Payer: Self-pay | Admitting: Psychiatry

## 2022-04-09 NOTE — Telephone Encounter (Signed)
Patient said sweating and anxiety woke her up from sleep, which is why she called the on-call #. She said she is no better today.  I mentioned the Genesight test to her and she was interested in doing it. I told her she could come in anytime between 9-5, except between 12-1.

## 2022-04-09 NOTE — Telephone Encounter (Signed)
Noted. Ty!

## 2022-04-09 NOTE — Telephone Encounter (Signed)
RTC 310 AM  The patient called at 1:37 AM.  I could not reach the patient by phone but left a message.  From even reviewing the chart it appears the patient's complaint is similar to the complaints she has had at the last few visits.  She is anxious and sweating.  She and her provider have decided the sertraline is not effective for her anxiety and depression and are in the process of tapering it.  There is a plan to pursue genetic testing to determine or at least gain information as to the optimal medication to take for her.  That is a good plan.  The patient is young and healthy and there is no indication of dangerousness.  No medication changes should be made in the middle of the night and this's case.  In general if she has appropriate genetics while sertraline is the first choice for anxiety with depression and especially panic attacks, if that fails paroxetine would be a good second choice again providing that the genetics are consistent.  As we could not reach the patient tonight no further action is warranted   Lynder Parents, MD, DFAPA .

## 2022-04-09 NOTE — Telephone Encounter (Signed)
Let me know if you don't reach her, sometimes you have to call twice.

## 2022-04-09 NOTE — Telephone Encounter (Signed)
Would you please follow up with patient this morning - along with Gene Sight testing.

## 2022-04-12 ENCOUNTER — Encounter (HOSPITAL_BASED_OUTPATIENT_CLINIC_OR_DEPARTMENT_OTHER): Payer: Self-pay | Admitting: Emergency Medicine

## 2022-04-12 ENCOUNTER — Emergency Department (HOSPITAL_BASED_OUTPATIENT_CLINIC_OR_DEPARTMENT_OTHER): Payer: No Typology Code available for payment source | Admitting: Radiology

## 2022-04-12 ENCOUNTER — Emergency Department (HOSPITAL_BASED_OUTPATIENT_CLINIC_OR_DEPARTMENT_OTHER)
Admission: EM | Admit: 2022-04-12 | Discharge: 2022-04-12 | Disposition: A | Discharge status: Home / Self Care | Payer: No Typology Code available for payment source | Attending: Emergency Medicine | Admitting: Emergency Medicine

## 2022-04-12 ENCOUNTER — Other Ambulatory Visit: Payer: Self-pay

## 2022-04-12 DIAGNOSIS — R0602 Shortness of breath: Secondary | ICD-10-CM | POA: Diagnosis not present

## 2022-04-12 DIAGNOSIS — R0600 Dyspnea, unspecified: Secondary | ICD-10-CM

## 2022-04-12 DIAGNOSIS — R0789 Other chest pain: Secondary | ICD-10-CM | POA: Diagnosis not present

## 2022-04-12 LAB — HCG, SERUM, QUALITATIVE: Preg, Serum: NEGATIVE

## 2022-04-12 LAB — CBC
HCT: 40.3 % (ref 36.0–46.0)
Hemoglobin: 12.9 g/dL (ref 12.0–15.0)
MCH: 28.4 pg (ref 26.0–34.0)
MCHC: 32 g/dL (ref 30.0–36.0)
MCV: 88.8 fL (ref 80.0–100.0)
Platelets: 284 10*3/uL (ref 150–400)
RBC: 4.54 MIL/uL (ref 3.87–5.11)
RDW: 12.9 % (ref 11.5–15.5)
WBC: 5.7 10*3/uL (ref 4.0–10.5)
nRBC: 0 % (ref 0.0–0.2)

## 2022-04-12 LAB — BASIC METABOLIC PANEL
Anion gap: 11 (ref 5–15)
BUN: 9 mg/dL (ref 6–20)
CO2: 24 mmol/L (ref 22–32)
Calcium: 10.3 mg/dL (ref 8.9–10.3)
Chloride: 102 mmol/L (ref 98–111)
Creatinine, Ser: 0.92 mg/dL (ref 0.44–1.00)
GFR, Estimated: >60 mL/min (ref >60)
Glucose, Bld: 90 mg/dL (ref 70–99)
Potassium: 3.5 mmol/L (ref 3.5–5.1)
Sodium: 137 mmol/L (ref 135–145)

## 2022-04-12 LAB — TROPONIN I (HIGH SENSITIVITY)
Troponin I (High Sensitivity): <2 ng/L (ref <18)
Troponin I (High Sensitivity): <2 ng/L (ref <18)

## 2022-04-12 NOTE — ED Notes (Signed)
Pt oob ambulating to hall bathroom independently - gait steady; no acute distress noted.

## 2022-04-12 NOTE — ED Provider Notes (Signed)
[facility] Provider Note   CSN: [id number] Arrival date & time: 04/12/22  0030     History  Chief Complaint  Patient presents with   Shortness of Breath    Kristina Blair is a 27 y.o. female.  27 year old female without significant past medical history who presents the ER today secondary to shortness of breath.  Patient states that when she laid down last night on her left side she felt short of breath and a little chest tightness.  She states that she had rolled her right side and was there to but not as bad as she states when she sat up it was fine.  States she does not feel that way now.   Shortness of Breath      Home Medications Prior to Admission medications   Medication Sig Start Date End Date Taking? Authorizing Provider  albuterol (VENTOLIN HFA) 108 (90 Base) MCG/ACT inhaler     [provider]  clindamycin-tretinoin (ZIANA) gel Apply topically at bedtime. 10/17/21   Jeanie Sewer, NP  doxycycline (VIBRA-TABS) 100 MG tablet Take 1 tablet (100 mg total) by mouth 2 (two) times daily. 10/17/21   Jeanie Sewer, NP  levonorgestrel (KYLEENA) 19.5 MG IUD 1 each by Intrauterine route once.    [provider]  methocarbamol (ROBAXIN) 500 MG tablet Take 1 tablet (500 mg total) by mouth 2 (two) times daily. 02/28/22   Suzy Bouchard, PA-C  Multiple Vitamins-Calcium (ONE-A-DAY WOMENS FORMULA) TABS Take 1 tablet by mouth daily with breakfast.    [provider]  naproxen (NAPROSYN) 500 MG tablet Take 1 tablet (500 mg total) by mouth 2 (two) times daily with a meal. 07/26/21   Jeanie Sewer, NP  sertraline (ZOLOFT) 100 MG tablet Take two tablets daily. 03/07/22   Mozingo, Berdie Ogren, NP  temazepam (RESTORIL) 15 MG capsule Take one to two capsules at bedtime as needed for sleep. 03/27/22   Mozingo, Berdie Ogren, NP      Allergies    Kiwi extract and Reglan [metoclopramide]    Review of  Systems   Review of Systems  Respiratory:  Positive for shortness of breath.     Physical Exam Updated Vital Signs BP 124/80   Pulse 81   Temp 98.2 F (36.8 C)   Resp 13   SpO2 100%  Physical Exam Vitals and nursing note reviewed.  Constitutional:      Appearance: She is well-developed.  HENT:     Head: Normocephalic and atraumatic.  Cardiovascular:     Rate and Rhythm: Normal rate and regular rhythm.  Pulmonary:     Effort: No respiratory distress.     Breath sounds: No stridor. No decreased breath sounds or wheezing.     Comments: Patient laid flat here for approximately 5 minutes on her left side without any noted respiratory distress, hypoxia, tachycardia or any other changes. Abdominal:     General: There is no distension.  Musculoskeletal:        General: Normal range of motion.     Cervical back: Normal range of motion.  Skin:    General: Skin is warm and dry.  Neurological:     General: No focal deficit present.     Mental Status: She is alert.     ED Results / Procedures / Treatments   Labs (all labs ordered are listed, but only abnormal results are displayed) Labs Reviewed  BASIC METABOLIC PANEL  CBC  HCG, SERUM, QUALITATIVE  TROPONIN I (HIGH SENSITIVITY)  TROPONIN I (HIGH SENSITIVITY)    EKG EKG Interpretation  Date/Time:  Friday April 12 2022 01:04:05 EST Ventricular Rate:  97 PR Interval:  150 QRS Duration: 84 QT Interval:  344 QTC Calculation: 436 R Axis:   78 Text Interpretation: Normal sinus rhythm Nonspecific T wave abnormality Abnormal ECG When compared with ECG of 28-Feb-2022 14:57, Nonspecific T wave abnormality has replaced inverted T waves in Inferior leads Confirmed by Merrily Pew [phone]) on 04/12/2022 4:06:16 AM  Radiology DG Chest 2 View  Result Date: 04/12/2022 CLINICAL DATA:  chest tightness EXAM: CHEST - 2 VIEW COMPARISON:  Chest x-ray 02/28/2022 FINDINGS: The heart and mediastinal contours are within normal limits. No  focal consolidation. No pulmonary edema. No pleural effusion. No pneumothorax. No acute osseous abnormality. IMPRESSION: No active cardiopulmonary disease. Electronically Signed   By: Iven Finn M.D.   On: 04/12/2022 01:24    Procedures Procedures    Medications Ordered in ED Medications - No data to display  ED Course/ Medical Decision Making/ A&P                             Medical Decision Making Amount and/or Complexity of Data Reviewed Labs: ordered. Radiology: ordered.   Unclear why the patient is having her symptoms.  No think she has a PE.  No evidence of pneumonia on her chest x-ray or physical exam.  Could be anxiety versus indigestion she did have a coughing spell around this time and has had a history of GERD in the past.  She will continue to monitor at home follow-up PCP if it continues.   Final Clinical Impression(s) / ED Diagnoses Final diagnoses:  Dyspnea, unspecified type    Rx / DC Orders ED Discharge Orders     None         Aleyza Salmi, Corene Cornea, MD 04/12/22 [phone]

## 2022-04-12 NOTE — ED Triage Notes (Signed)
Sob chest tightness started around midnight

## 2022-04-12 NOTE — ED Notes (Signed)
Pt agreeable with d/c plan as discussed by provider - this nurse has verbally reinforced d/c instructions and provided pt with written copy.  Pt acknowledges verbal understanding and denies any addl questions, concerns, needs- pt ambulatory independently at d/c with steady gait; no acute changes/distress noted at departure

## 2022-04-16 ENCOUNTER — Telehealth: Payer: Self-pay | Admitting: *Deleted

## 2022-04-16 NOTE — Transitions of Care (Post Inpatient/ED Visit) (Signed)
   04/16/2022  Name: Kristina Blair MRN: [id number] DOB: [birth year]  Today's TOC FU Call Status: Today's TOC FU Call Status:: Successful TOC FU Call Competed TOC FU Call Complete Date: 04/16/22  Transition Care Management Follow-up Telephone Call Date of Discharge: 04/12/22 Discharge Facility: [facility] Type of Discharge: Emergency Department Reason for ED Visit: Respiratory Respiratory Diagnosis:  (shortness for breath when exercising) How have you been since you were released from the hospital?: Same Any questions or concerns?: No  Items Reviewed: Did you receive and understand the discharge instructions provided?: Yes Medications obtained and verified?: Yes (Medications Reviewed) Any new allergies since your discharge?: No Dietary orders reviewed?: NA Do you have support at home?: Yes  Home Care and Equipment/Supplies: [place] Ordered?: NA Any new equipment or medical supplies ordered?: NA  Functional Questionnaire: Do you need assistance with bathing/showering or dressing?: No Do you need assistance with meal preparation?: No Do you need assistance with eating?: No Do you have difficulty maintaining continence: No Do you need assistance with getting out of bed/getting out of a chair/moving?: No Do you have difficulty managing or taking your medications?: No  Folllow up appointments reviewed: PCP Follow-up appointment confirmed?: Yes Date of PCP follow-up appointment?: 04/17/22 Follow-up Provider: Jeanie Sewer, NP [facility] Follow-up appointment confirmed?: NA Do you need transportation to your follow-up appointment?: No Do you understand care options if your condition(s) worsen?: Yes-patient verbalized understanding    SIGNATURE:  Hildred Alamin, RN

## 2022-04-17 ENCOUNTER — Telehealth (HOSPITAL_BASED_OUTPATIENT_CLINIC_OR_DEPARTMENT_OTHER): Payer: Self-pay | Admitting: Cardiology

## 2022-04-17 ENCOUNTER — Telehealth: Payer: Self-pay | Admitting: Family

## 2022-04-17 ENCOUNTER — Encounter: Payer: Self-pay | Admitting: Family

## 2022-04-17 ENCOUNTER — Ambulatory Visit (INDEPENDENT_AMBULATORY_CARE_PROVIDER_SITE_OTHER): Payer: No Typology Code available for payment source | Admitting: Family

## 2022-04-17 VITALS — BP 147/70 | HR 91 | Temp 98.1°F | Ht 61.0 in | Wt 143.2 lb

## 2022-04-17 DIAGNOSIS — R000 Tachycardia, unspecified: Secondary | ICD-10-CM | POA: Diagnosis not present

## 2022-04-17 DIAGNOSIS — R0602 Shortness of breath: Secondary | ICD-10-CM

## 2022-04-17 DIAGNOSIS — R635 Abnormal weight gain: Secondary | ICD-10-CM

## 2022-04-17 DIAGNOSIS — Q796 Ehlers-Danlos syndrome, unspecified: Secondary | ICD-10-CM | POA: Insufficient documentation

## 2022-04-17 MED ORDER — PROPRANOLOL HCL 10 MG PO TABS: 10.0000 mg | ORAL_TABLET | Freq: Three times a day (TID) | ORAL | 0 refills | Status: DC | PRN

## 2022-04-17 NOTE — Telephone Encounter (Signed)
Pt would like to TOC from [employer] to [employer].   Patient also requested a call from [place].

## 2022-04-17 NOTE — Progress Notes (Addendum)
Patient ID: Kristina Blair, female    DOB: 18-Apr-1995, 27 y.o.   MRN: [id number]  Chief Complaint  Patient presents with   Follow-up    Pt was seen in ED on 2/9. Pt states she was having SOB , also a flutter in the center of her chest. Pt states she gets out of breath when walking. Present for about a couple of months.    *Pt provided verbal consent to allow NP student be present during entire visit to listen and observe.   HPI: ED f/u:  seen about a week ago for SOB and palpitations, fluttering in her chest, fast heart rate. All testing negative, CXR neg, no cardiac origin, reports she feels mostly with walking exertion, had near syncopal episode in past due to high heart rate when exercising. States she gets even more tachycardic when using her Albuterol inhaler but does help her SOB.  Seeing Psych for anxiety/depression, does not feel current sx are related, taking Zoloft & Temazepam. Per review of EMR & last Psych note in Jan pt informed provider she did not feel Zoloft was working as well any more for her sx. Pt also reports gaining weight without any change to her diet. Per chart record she has gained 20lbs over the last 2 months. HCG testing in ER neg. Denies any new meds, pt unable to exercise d/t tachycardia.  Assessment & Plan:   Problem List Items Addressed This Visit    Visit Diagnoses     Tachycardia    -  Primary pt reports having sx along w/palpitations and SOB. Advised pt regarding importance of controlling HR in preventing cascade of sx escalating. Seen in ER and CXR normal, cardiac origin ruled out. pt given Propranolol in past, did not think it helped her sx very well. Advised to try again as it was more for anxiety tx at that time than controlling heart rate.    Relevant Medications   propranolol (INDERAL) 10 MG tablet   Weight gain, abnormal    - 20lbs over last 2 mos. Advised on decreasing total calories per day, using phone apps to help track total calories and reducing  calories by 100/day weekly but not below 1100/day. Increasing water intake and exercise. Hoping Propranolol re-prescribed today will help with controlling HR so pt feels comfortable exercising.    Subjective:    Outpatient Medications Prior to Visit  Medication Sig Dispense Refill   albuterol (VENTOLIN HFA) 108 (90 Base) MCG/ACT inhaler      levonorgestrel (KYLEENA) 19.5 MG IUD 1 each by Intrauterine route once.     Multiple Vitamins-Calcium (ONE-A-DAY WOMENS FORMULA) TABS Take 1 tablet by mouth daily with breakfast.     sertraline (ZOLOFT) 100 MG tablet Take two tablets daily. 60 tablet 2   temazepam (RESTORIL) 15 MG capsule Take one to two capsules at bedtime as needed for sleep. 60 capsule 0   doxycycline (VIBRA-TABS) 100 MG tablet Take 1 tablet (100 mg total) by mouth 2 (two) times daily. 28 tablet 0   methocarbamol (ROBAXIN) 500 MG tablet Take 1 tablet (500 mg total) by mouth 2 (two) times daily. 20 tablet 0   naproxen (NAPROSYN) 500 MG tablet Take 1 tablet (500 mg total) by mouth 2 (two) times daily with a meal. 30 tablet 0   clindamycin-tretinoin (ZIANA) gel Apply topically at bedtime. (Patient not taking: Reported on 04/17/2022) 30 g 0   No facility-administered medications prior to visit.   Past Medical History:  Diagnosis Date  Abnormal thyroid function test 01/17/2021   Abnormal weight loss 06/18/2021   Allergy    Anxiety    ASCUS of cervix with negative high risk HPV    Asthma    Phreesia 02/13/2020   Generalized anxiety disorder 06/28/2021   GERD (gastroesophageal reflux disease)    Phreesia 02/13/2020   Hypoglycemia 01/17/2021   IUD (intrauterine device) in place 09/2019   liletta   Malnutrition of moderate degree 06/11/2021   Near syncope 05/30/2021   Paroxysmal SVT (supraventricular tachycardia)    Past Surgical History:  Procedure Laterality Date   ESOPHAGOGASTRODUODENOSCOPY (EGD) WITH PROPOFOL N/A 06/14/2021   Procedure: ESOPHAGOGASTRODUODENOSCOPY (EGD) WITH  PROPOFOL;  Surgeon: Arta Silence, MD;  Location: [place];  Service: Gastroenterology;  Laterality: N/A;   WISDOM TOOTH EXTRACTION     Allergies  Allergen Reactions   Kiwi Extract Itching and Other (See Comments)    Tongue itching   Reglan [Metoclopramide] Anxiety      Objective:    Physical Exam Vitals and nursing note reviewed.  Constitutional:      Appearance: Normal appearance.  Cardiovascular:     Rate and Rhythm: Normal rate and regular rhythm.  Pulmonary:     Effort: Pulmonary effort is normal.     Breath sounds: Normal breath sounds.  Musculoskeletal:        General: Normal range of motion.  Skin:    General: Skin is warm and dry.  Neurological:     Mental Status: She is alert.  Psychiatric:        Mood and Affect: Mood normal.        Behavior: Behavior normal.    BP (!) 147/70 (BP Location: Left Arm, Patient Position: Sitting, Cuff Size: Large)   Pulse 91   Temp 98.1 F (36.7 C) (Temporal)   Ht 5' 1"$  (1.549 m)   Wt 143 lb 4 oz (65 kg)   SpO2 99%   BMI 27.07 kg/m  Wt Readings from Last 3 Encounters:  04/17/22 143 lb 4 oz (65 kg)  02/28/22 123 lb (55.8 kg)  10/17/21 131 lb 3.2 oz (59.5 kg)      Jeanie Sewer, NP

## 2022-04-17 NOTE — Telephone Encounter (Signed)
Patient wants a provider switch from Dr. Harrell Gave to Dr. Oval Linsey.

## 2022-04-22 ENCOUNTER — Ambulatory Visit (INDEPENDENT_AMBULATORY_CARE_PROVIDER_SITE_OTHER): Payer: No Typology Code available for payment source | Admitting: Behavioral Health

## 2022-04-22 DIAGNOSIS — F418 Other specified anxiety disorders: Secondary | ICD-10-CM

## 2022-04-22 NOTE — Progress Notes (Unsigned)
[facility] Counselor/Therapist Progress Note  Patient ID: Kristina Blair, MRN: [id number],    Date: 04/22/2022  Time Spent: 30 minutes   Treatment Type: Individual Therapy  Reported Symptoms: Anxiety   Mental Status Exam:  Appearance:   Casual     Behavior:  Appropriate and Sharing  Motor:  Normal  Speech/Language:   Clear and Coherent  Affect:  Appropriate  Mood:  normal  Thought process:  normal  Thought content:    WNL  Sensory/Perceptual disturbances:    WNL  Orientation:  oriented to person  Attention:  Good  Concentration:  Good  Memory:  WNL  Fund of knowledge:   Good  Insight:    Good  Judgment:   Good  Impulse Control:  Good   Risk Assessment: Danger to Self:  No Self-injurious Behavior: No Danger to Others: No Duty to Warn:no Physical Aggression / Violence:No  Access to Firearms a concern: No  Gang Involvement:No   Subjective:   The patient reports "I am still off leave from work". 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 She reports she is waiting for the results from her genotype test. She states she has been without prescribed medication for almost two months. The patient identified symptoms experienced for anxiety as "Rapid heart beat, jitteriness, feeling restfulness, feeling like I am dying, not sleeping". She reports to experience panic attacks daily. She states to have random panic attacks. She states she is not doing anything prior to having a panic attack.    The patient reports to use deep breathing techniques to assist her with coping, putting cold water on her face or feet, listen to music or take a nap. She rated her fear of having another panic attack at a 2 on a scale of 1 to 5 with 5 being very worried. She rated her discomfort of experiencing a panic attack at a 5 on a scale of 1 to 5 with 5 being severe. The patient denied changing her behavior because of  her panic attacks she has experienced. The patient states others are not aware when she is having a panic attack. She rated anxiety at a 7 and depression at a 2 on a scale of 1 to 10 with 10 being severe. The patient states belief that medication would be helpful she states she has been prescribed Pro[facility]olol from [facility]. The patient states coping strategies she currently use are helpful and she is currently sleeping and that has helped subside her panic symptoms. She states she is taking Temazepam to help her sleep. She reports she has decreased her sugar intake, as well as lighting in her room, and listening to a pod cast to help with sleep. The patient reported interest in receiving trauma therapy. She was provided Elmer Bales and [employer] contact information. The patient declined interest with receiving general psychotherapy with this counselor at this time. She presents with understanding to follow up with [facility] if her needs change. The patient denied experiencing SI/HI, AH/VH.   Counselor conducted check in. Counselor discussed mental health symptoms experienced and coping strategies utilized. Counselor requested the patient rate her symptoms. Counselor assessed the patients panic attack symptoms and the severity. Counselor provided the patient with contact information of trauma therapist due to her interest in receiving trauma therapy. Counselor informed the patient if her needs change she may follow up with [facility]. Counselor assessed  for SI/HI, AH/VH.       Interventions: Motivational Interviewing  Diagnosis:   ICD-10-CM   1. Anxiety about health  F41.8       Plan:   1) Long Term Goal: Stabilize anxiety level while increasing ability to function on a daily basis.     Short Term Goal: Develop strategies to reduce symptoms.     Objective: Learn two new ways of coping with routine stressors.      Objective: Develop strategies for thought  distraction when fixating on the future.    2) Long Term Goal: Restore restful sleep pattern.     Short Term Goal: Achieve approximately 6 hours or more of restful sleep each night.     Objective: Learn and implement calming skills for use at bedtime.     Objective: Learn and implement stimulus control strategies to establish a consistent sleep-wake rhythm.         Jannifer Hick, [facility]

## 2022-04-23 ENCOUNTER — Encounter: Payer: Self-pay | Admitting: Behavioral Health

## 2022-04-24 ENCOUNTER — Encounter: Payer: Self-pay | Admitting: Adult Health

## 2022-04-25 ENCOUNTER — Encounter (HOSPITAL_BASED_OUTPATIENT_CLINIC_OR_DEPARTMENT_OTHER): Payer: Self-pay

## 2022-04-27 ENCOUNTER — Other Ambulatory Visit: Payer: Self-pay | Admitting: Adult Health

## 2022-04-27 DIAGNOSIS — F422 Mixed obsessional thoughts and acts: Secondary | ICD-10-CM

## 2022-04-27 DIAGNOSIS — F320 Major depressive disorder, single episode, mild: Secondary | ICD-10-CM

## 2022-04-27 DIAGNOSIS — F418 Other specified anxiety disorders: Secondary | ICD-10-CM

## 2022-04-28 NOTE — Telephone Encounter (Signed)
Has appt 2/27 with Barnett Applebaum

## 2022-04-30 ENCOUNTER — Ambulatory Visit (HOSPITAL_BASED_OUTPATIENT_CLINIC_OR_DEPARTMENT_OTHER): Payer: No Typology Code available for payment source | Admitting: Obstetrics & Gynecology

## 2022-04-30 ENCOUNTER — Telehealth (INDEPENDENT_AMBULATORY_CARE_PROVIDER_SITE_OTHER): Payer: No Typology Code available for payment source | Admitting: Adult Health

## 2022-04-30 ENCOUNTER — Encounter: Payer: Self-pay | Admitting: Adult Health

## 2022-04-30 DIAGNOSIS — F418 Other specified anxiety disorders: Secondary | ICD-10-CM

## 2022-04-30 DIAGNOSIS — F422 Mixed obsessional thoughts and acts: Secondary | ICD-10-CM | POA: Diagnosis not present

## 2022-04-30 DIAGNOSIS — F5105 Insomnia due to other mental disorder: Secondary | ICD-10-CM

## 2022-04-30 DIAGNOSIS — F99 Mental disorder, not otherwise specified: Secondary | ICD-10-CM

## 2022-04-30 DIAGNOSIS — F320 Major depressive disorder, single episode, mild: Secondary | ICD-10-CM

## 2022-04-30 DIAGNOSIS — R4589 Other symptoms and signs involving emotional state: Secondary | ICD-10-CM

## 2022-04-30 MED ORDER — DESVENLAFAXINE SUCCINATE ER 25 MG PO TB24: 25.0000 mg | ORAL_TABLET | Freq: Every day | ORAL | 2 refills | Status: DC

## 2022-04-30 MED ORDER — TEMAZEPAM 15 MG PO CAPS: ORAL_CAPSULE | ORAL | 0 refills | Status: DC

## 2022-04-30 NOTE — Progress Notes (Signed)
Kristina Blair [id number] 1996-03-04 27 y.o.  Virtual Visit via Video Note  I connected with pt @ on 04/30/22 at  2:00 PM EST by a video enabled telemedicine application and verified that I am speaking with the correct person using two identifiers.   I discussed the limitations of evaluation and management by telemedicine and the availability of in person appointments. The patient expressed understanding and agreed to proceed.  I discussed the assessment and treatment plan with the patient. The patient was provided an opportunity to ask questions and all were answered. The patient agreed with the plan and demonstrated an understanding of the instructions.   The patient was advised to call back or seek an in-person evaluation if the symptoms worsen or if the condition fails to improve as anticipated.  I provided 26 minutes of non-face-to-face time during this encounter.  The patient was located at home.  The provider was located at [place].   Aloha Gell, NP   Subjective:   Patient ID:  Kristina Blair is a 27 y.o. (DOB [birth year]) female.  Chief Complaint: No chief complaint on file.   HPI Kristina Blair presents for follow-up of anxiety about health, insomnia, mixed obsessional thoughts and acts, and current mild depression.  Describes mood today as "about the same". Pleasant. Tearful at times. Mood symptoms - reports depression, anxiety, and irritability. Reports worry, rumination, and over thinking. Reports obsessive thoughts and acts. Reports panic attacks - PCP prescribed Propranolol. Stating "I don't feel like I'm doing any better". Has received the Genesight testing results and is willing to do a trial of Pristiq. Decreased interest and motivation. Taking medications as prescribed.  Energy levels lower.  Active, does not have a regular exercise routine.   Unable to enjoy usual interests and activities. Single. Lives with grandmother and 1 dog. Spending time with family and  friends. Appetite stable. Weight stable. Sleep has improved. Averages 8 hours - with Temazepam Focus and concentration difficulties. Completing tasks. Managing aspects of household. Works full time for [employer] - out of work since 03/01/2022 - plans to return to work this week.. Denies SI or HI.  Denies AH or VH. Denies self harm. Denies substance use.  Previous medication trials: Zoloft, Prozac, Lamictal, Trazadone, Temazepam, Wellbutrin - helpful, various other medication  Review of Systems:  Review of Systems  Musculoskeletal:  Negative for gait problem.  Neurological:  Negative for tremors.  Psychiatric/Behavioral:         Please refer to HPI    Medications: I have reviewed the patient's current medications.  Current Outpatient Medications  Medication Sig Dispense Refill   desvenlafaxine (PRISTIQ) 25 MG 24 hr tablet Take 1 tablet (25 mg total) by mouth daily. 30 tablet 2   albuterol (VENTOLIN HFA) 108 (90 Base) MCG/ACT inhaler      levonorgestrel (KYLEENA) 19.5 MG IUD 1 each by Intrauterine route once.     Multiple Vitamins-Calcium (ONE-A-DAY WOMENS FORMULA) TABS Take 1 tablet by mouth daily with breakfast.     propranolol (INDERAL) 10 MG tablet Take 1 tablet (10 mg total) by mouth 3 (three) times daily as needed. 30 tablet 0   temazepam (RESTORIL) 15 MG capsule Take one to two capsules at bedtime as needed for sleep. 60 capsule 0   No current facility-administered medications for this visit.    Medication Side Effects: None  Allergies:  Allergies  Allergen Reactions   Kiwi Extract Itching and Other (See Comments)    Tongue itching  Reglan [Metoclopramide] Anxiety    Past Medical History:  Diagnosis Date   Abnormal thyroid function test 01/17/2021   Abnormal weight loss 06/18/2021   Allergy    Anxiety    ASCUS of cervix with negative high risk HPV    Asthma    Phreesia 02/13/2020   Generalized anxiety disorder 06/28/2021   GERD  (gastroesophageal reflux disease)    Phreesia 02/13/2020   Hypoglycemia 01/17/2021   IUD (intrauterine device) in place 09/2019   liletta   Malnutrition of moderate degree 06/11/2021   Near syncope 05/30/2021   Paroxysmal SVT (supraventricular tachycardia)     Family History  Problem Relation Age of Onset   Diabetes Mother    Hypertension Mother    Breast cancer Mother 14       recurrence age 60, reports BRCA1+   Early death Mother    Hyperlipidemia Mother    Miscarriages / Stillbirths Mother    Obesity Mother    Alcohol abuse Mother    Cancer Mother    Bipolar disorder Father    Cerebral palsy Brother    Epilepsy Brother    ADD / ADHD Brother    Cancer Maternal Aunt 73       died at 52, maternal great aunt   Asthma Maternal Aunt    Diabetes Maternal Grandmother    Heart disease Maternal Grandmother    Hyperlipidemia Maternal Grandmother    Hypertension Maternal Grandmother    Arthritis Maternal Grandmother    COPD Maternal Grandmother    Hearing loss Maternal Grandmother    Leukemia Maternal Grandfather        dx. childhood   Early death Maternal Grandfather    Colon cancer Maternal Grandfather 32   Prostate cancer Paternal Grandfather        metastatic   Ovarian cancer Maternal Great-grandmother 20   Esophageal cancer Neg Hx    Pancreatic cancer Neg Hx    Rectal cancer Neg Hx    Stomach cancer Neg Hx    Liver cancer Neg Hx     Social History   Socioeconomic History   Marital status: Single    Spouse name: Not on file   Number of children: Not on file   Years of education: Not on file   Highest education level: Not on file  Occupational History   Not on file  Tobacco Use   Smoking status: Never   Smokeless tobacco: Never  Vaping Use   Vaping Use: Never used  Substance and Sexual Activity   Alcohol use: Never   Drug use: Never   Sexual activity: Not Currently    Birth control/protection: I.U.D.  Other Topics Concern   Not on file  Social History  Narrative   Not on file   Social Determinants of Health   Financial Resource Strain: Not on file  Food Insecurity: Not on file  Transportation Needs: Not on file  Physical Activity: Not on file  Stress: Not on file  Social Connections: Not on file  Intimate Partner Violence: Not on file    Past Medical History, Surgical history, Social history, and Family history were reviewed and updated as appropriate.   Please see review of systems for further details on the patient's review from today.   Objective:   Physical Exam:  There were no vitals taken for this visit.  Physical Exam Constitutional:      General: She is not in acute distress. Musculoskeletal:        General:  No deformity.  Neurological:     Mental Status: She is alert and oriented to person, place, and time.     Coordination: Coordination normal.  Psychiatric:        Attention and Perception: Attention and perception normal. She does not perceive auditory or visual hallucinations.        Mood and Affect: Mood normal. Mood is not anxious or depressed. Affect is not labile, blunt, angry or inappropriate.        Speech: Speech normal.        Behavior: Behavior normal.        Thought Content: Thought content normal. Thought content is not paranoid or delusional. Thought content does not include homicidal or suicidal ideation. Thought content does not include homicidal or suicidal plan.        Cognition and Memory: Cognition and memory normal.        Judgment: Judgment normal.     Comments: Insight intact     Lab Review:     Component Value Date/Time   NA 137 04/12/2022 0054   NA 139 01/17/2021 0926   K 3.5 04/12/2022 0054   CL 102 04/12/2022 0054   CO2 24 04/12/2022 0054   GLUCOSE 90 04/12/2022 0054   BUN 9 04/12/2022 0054   BUN 8 01/17/2021 0926   CREATININE 0.92 04/12/2022 0054   CALCIUM 10.3 04/12/2022 0054   PROT 8.4 (H) 02/28/2022 1504   PROT 7.3 01/17/2021 0926   ALBUMIN 4.8 02/28/2022 1504    ALBUMIN 4.9 01/17/2021 0926   AST 21 02/28/2022 1504   ALT 13 02/28/2022 1504   ALKPHOS 64 02/28/2022 1504   BILITOT 0.2 (L) 02/28/2022 1504   BILITOT 0.3 01/17/2021 0926   GFRNONAA >60 04/12/2022 0054       Component Value Date/Time   WBC 5.7 04/12/2022 0054   RBC 4.54 04/12/2022 0054   HGB 12.9 04/12/2022 0054   HGB 13.0 01/17/2021 0926   HCT 40.3 04/12/2022 0054   HCT 39.1 01/17/2021 0926   PLT 284 04/12/2022 0054   PLT 297 01/17/2021 0926   MCV 88.8 04/12/2022 0054   MCV 85 01/17/2021 0926   MCH 28.4 04/12/2022 0054   MCHC 32.0 04/12/2022 0054   RDW 12.9 04/12/2022 0054   RDW 11.8 01/17/2021 0926   LYMPHSABS 2.0 06/24/2021 1628   LYMPHSABS 1.8 01/17/2021 0926   MONOABS 0.5 06/24/2021 1628   EOSABS 0.1 06/24/2021 1628   EOSABS 0.1 01/17/2021 0926   BASOSABS 0.0 06/24/2021 1628   BASOSABS 0.0 01/17/2021 0926    No results found for: "POCLITH", "LITHIUM"   No results found for: "PHENYTOIN", "PHENOBARB", "VALPROATE", "CBMZ"   .res Assessment: Plan:   Plan:  PDMP reviewed  Gene sight testing reviewed - will add a low dose of Pristiq - '25mg'$  every morning.  Continue Temazepam - take 1 to 2 at bedtime as needed for sleep.  Has been unable to work since 02/28/2022. Still unable to work at this time - will continue leave of absence for the next 4 weeks - through 05/01/2022. Will make an appt for 05/01/2022.  Plan to set a therapist - trauma  RTC 2 weeks  Patient advised to contact office with any questions, adverse effects, or acute worsening in signs and symptoms.   Discussed potential benefits, risk, and side effects of benzodiazepines to include potential risk of tolerance and dependence, as well as possible drowsiness.  Advised patient not to drive if experiencing drowsiness and to take lowest possible  effective dose to minimize risk of dependence and tolerance.   Diagnoses and all orders for this visit:  Anxiety about health -     desvenlafaxine (PRISTIQ)  25 MG 24 hr tablet; Take 1 tablet (25 mg total) by mouth daily.  Insomnia due to other mental disorder -     temazepam (RESTORIL) 15 MG capsule; Take one to two capsules at bedtime as needed for sleep.  Mixed obsessional thoughts and acts  Current mild episode of major depressive disorder without prior episode [facility])     Please see After Visit Summary for patient specific instructions.  Future Appointments  Date Time Provider [place]  05/07/2022  8:15 AM Elvera Maria, CNM [facility]  05/07/2022 12:45 PM Talbot Grumbling, MD [facility] None  05/28/2022  9:30 AM [facility] PFT ROOM [facility]  05/28/2022 10:30 AM Margaretha Seeds, MD [facility]    No orders of the defined types were placed in this encounter.     -------------------------------

## 2022-05-07 ENCOUNTER — Ambulatory Visit (HOSPITAL_BASED_OUTPATIENT_CLINIC_OR_DEPARTMENT_OTHER): Payer: No Typology Code available for payment source | Admitting: Advanced Practice Midwife

## 2022-05-07 ENCOUNTER — Ambulatory Visit: Payer: No Typology Code available for payment source | Admitting: Dermatology

## 2022-05-08 ENCOUNTER — Other Ambulatory Visit: Payer: Self-pay

## 2022-05-08 ENCOUNTER — Telehealth: Payer: Self-pay | Admitting: Adult Health

## 2022-05-08 DIAGNOSIS — F5105 Insomnia due to other mental disorder: Secondary | ICD-10-CM

## 2022-05-08 MED ORDER — TEMAZEPAM 15 MG PO CAPS: ORAL_CAPSULE | ORAL | 0 refills | Status: DC

## 2022-05-08 NOTE — Telephone Encounter (Signed)
Pt called at 1:26p.  She said she needs a 90 day script of Temazepam sent due to her insurance.  No upcoming appts scheduled

## 2022-05-08 NOTE — Telephone Encounter (Signed)
Pended.

## 2022-05-10 ENCOUNTER — Encounter (HOSPITAL_BASED_OUTPATIENT_CLINIC_OR_DEPARTMENT_OTHER): Payer: No Typology Code available for payment source

## 2022-05-23 ENCOUNTER — Ambulatory Visit: Payer: No Typology Code available for payment source | Admitting: Dermatology

## 2022-05-24 ENCOUNTER — Other Ambulatory Visit (HOSPITAL_BASED_OUTPATIENT_CLINIC_OR_DEPARTMENT_OTHER): Payer: Self-pay

## 2022-05-24 DIAGNOSIS — R0609 Other forms of dyspnea: Secondary | ICD-10-CM

## 2022-05-26 ENCOUNTER — Telehealth: Payer: Self-pay

## 2022-05-26 NOTE — Telephone Encounter (Signed)
Prior Authorization initiated for Temazepam 15 mg #60/30 days with [employer], pending response

## 2022-05-27 NOTE — Telephone Encounter (Signed)
Pt called and said that she needs to try klonopin  the  insurance denied the temazapam

## 2022-05-28 ENCOUNTER — Encounter (HOSPITAL_BASED_OUTPATIENT_CLINIC_OR_DEPARTMENT_OTHER): Payer: No Typology Code available for payment source

## 2022-05-28 ENCOUNTER — Institutional Professional Consult (permissible substitution) (HOSPITAL_BASED_OUTPATIENT_CLINIC_OR_DEPARTMENT_OTHER): Payer: No Typology Code available for payment source | Admitting: Pulmonary Disease

## 2022-05-28 NOTE — Telephone Encounter (Signed)
Prior Authorization for Temazepam #60/30 days was DENIED, pt's plan only covers up to 15 capsules per month.    Pt requesting different medication but this generic is cheap through [facility], [facility] she can get for $10.93 for Temazepam 15 mg #60.Other pharmacies such [facility], [facility], [facility] and [facility] is all around$14.00 as well.   Rollene Fare- Do you prefer she pay out of pocket? Or did you want to change medication?

## 2022-05-29 ENCOUNTER — Telehealth (INDEPENDENT_AMBULATORY_CARE_PROVIDER_SITE_OTHER): Payer: Self-pay | Admitting: Adult Health

## 2022-05-29 DIAGNOSIS — F489 Nonpsychotic mental disorder, unspecified: Secondary | ICD-10-CM

## 2022-05-29 NOTE — Progress Notes (Signed)
Patient no show appointment. ? ?

## 2022-05-30 ENCOUNTER — Telehealth: Payer: No Typology Code available for payment source | Admitting: Adult Health

## 2022-05-30 ENCOUNTER — Encounter: Payer: Self-pay | Admitting: Adult Health

## 2022-05-30 DIAGNOSIS — F320 Major depressive disorder, single episode, mild: Secondary | ICD-10-CM

## 2022-05-30 DIAGNOSIS — F418 Other specified anxiety disorders: Secondary | ICD-10-CM

## 2022-05-30 DIAGNOSIS — F422 Mixed obsessional thoughts and acts: Secondary | ICD-10-CM | POA: Diagnosis not present

## 2022-05-30 DIAGNOSIS — F5105 Insomnia due to other mental disorder: Secondary | ICD-10-CM | POA: Diagnosis not present

## 2022-05-30 DIAGNOSIS — F99 Mental disorder, not otherwise specified: Secondary | ICD-10-CM

## 2022-05-30 MED ORDER — TEMAZEPAM 15 MG PO CAPS: ORAL_CAPSULE | ORAL | 2 refills | Status: DC

## 2022-05-30 MED ORDER — DESVENLAFAXINE SUCCINATE ER 50 MG PO TB24: 50.0000 mg | ORAL_TABLET | Freq: Every day | ORAL | 2 refills | Status: DC

## 2022-05-30 NOTE — Progress Notes (Signed)
Kristina Blair [id number] [birth year] 27 y.o.  Virtual Visit via Video Note  I connected with pt @ on 05/30/22 at 11:20 AM EDT by a video enabled telemedicine application and verified that I am speaking with the correct person using two identifiers.   I discussed the limitations of evaluation and management by telemedicine and the availability of in person appointments. The patient expressed understanding and agreed to proceed.  I discussed the assessment and treatment plan with the patient. The patient was provided an opportunity to ask questions and all were answered. The patient agreed with the plan and demonstrated an understanding of the instructions.   The patient was advised to call back or seek an in-person evaluation if the symptoms worsen or if the condition fails to improve as anticipated.  I provided 15 minutes of non-face-to-face time during this encounter.  The patient was located at home.  The provider was located at [place].   Aloha Gell, NP   Subjective:   Patient ID:  Kristina Blair is a 27 y.o. (DOB 1995/12/31) female.  Chief Complaint: No chief complaint on file.   HPI Kristina Blair presents for follow-up of anxiety about health, insomnia, mixed obsessional thoughts and acts, and current mild depression.  Describes mood today as "about the same". Pleasant. Tearful at times. Mood symptoms - reports depression, anxiety. Reports increased irritability - more work related. Reports worry, rumination, and over thinking - "always". Denies obsessive thoughts and acts. Denies recent panic attacks.  Stating "I feel like I'm doing better". Has started the Pristiq at 25mg  without any toleration issues and would like to increase dose to 50mg  daily. Improved interest and motivation. Taking medications as prescribed.  Energy levels lower. Active, does not have a regular exercise routine.   Unable to enjoy usual interests and activities. Single. Lives with grandmother and 1  dog. Spending time with family and friends. Appetite stable. Weight gain - more hormonal. Sleep has improved. Averages 8 to 9 hours - with Temazepam Focus and concentration improved, but not where it needs to be - taking fish oil supplements. Completing tasks. Managing aspects of household. Works full time for [employer]. Denies SI or HI.  Denies AH or VH. Denies self harm. Denies substance use.  Previous medication trials: Zoloft, Prozac, Lamictal, Trazadone, Temazepam, Wellbutrin - helpful, various other medication   Review of Systems:  Review of Systems  Musculoskeletal:  Negative for gait problem.  Neurological:  Negative for tremors.  Psychiatric/Behavioral:         Please refer to HPI    Medications: I have reviewed the patient's current medications.  Current Outpatient Medications  Medication Sig Dispense Refill   albuterol (VENTOLIN HFA) 108 (90 Base) MCG/ACT inhaler      desvenlafaxine (PRISTIQ) 25 MG 24 hr tablet Take 1 tablet (25 mg total) by mouth daily. 30 tablet 2   levonorgestrel (KYLEENA) 19.5 MG IUD 1 each by Intrauterine route once.     Multiple Vitamins-Calcium (ONE-A-DAY WOMENS FORMULA) TABS Take 1 tablet by mouth daily with breakfast.     propranolol (INDERAL) 10 MG tablet Take 1 tablet (10 mg total) by mouth 3 (three) times daily as needed. 30 tablet 0   temazepam (RESTORIL) 15 MG capsule Take one to two capsules at bedtime as needed for sleep. 180 capsule 0   No current facility-administered medications for this visit.    Medication Side Effects: None  Allergies:  Allergies  Allergen Reactions   Kiwi Extract Itching and  Other (See Comments)    Tongue itching   Reglan [Metoclopramide] Anxiety    Past Medical History:  Diagnosis Date   Abnormal thyroid function test 01/17/2021   Abnormal weight loss 06/18/2021   Allergy    Anxiety    ASCUS of cervix with negative high risk HPV    Asthma    Phreesia 02/13/2020   Generalized  anxiety disorder 06/28/2021   GERD (gastroesophageal reflux disease)    Phreesia 02/13/2020   Hypoglycemia 01/17/2021   IUD (intrauterine device) in place 09/2019   liletta   Malnutrition of moderate degree 06/11/2021   Near syncope 05/30/2021   Paroxysmal SVT (supraventricular tachycardia)     Family History  Problem Relation Age of Onset   Diabetes Mother    Hypertension Mother    Breast cancer Mother 67       recurrence age 74, reports BRCA1+   Early death Mother    Hyperlipidemia Mother    Miscarriages / Stillbirths Mother    Obesity Mother    Alcohol abuse Mother    Cancer Mother    Bipolar disorder Father    Cerebral palsy Brother    Epilepsy Brother    ADD / ADHD Brother    Cancer Maternal Aunt 73       died at 48, maternal great aunt   Asthma Maternal Aunt    Diabetes Maternal Grandmother    Heart disease Maternal Grandmother    Hyperlipidemia Maternal Grandmother    Hypertension Maternal Grandmother    Arthritis Maternal Grandmother    COPD Maternal Grandmother    Hearing loss Maternal Grandmother    Leukemia Maternal Grandfather        dx. childhood   Early death Maternal Grandfather    Colon cancer Maternal Grandfather 32   Prostate cancer Paternal Grandfather        metastatic   Ovarian cancer Maternal Great-grandmother 28   Esophageal cancer Neg Hx    Pancreatic cancer Neg Hx    Rectal cancer Neg Hx    Stomach cancer Neg Hx    Liver cancer Neg Hx     Social History   Socioeconomic History   Marital status: Single    Spouse name: Not on file   Number of children: Not on file   Years of education: Not on file   Highest education level: Not on file  Occupational History   Not on file  Tobacco Use   Smoking status: Never   Smokeless tobacco: Never  Vaping Use   Vaping Use: Never used  Substance and Sexual Activity   Alcohol use: Never   Drug use: Never   Sexual activity: Not Currently    Birth control/protection: I.U.D.  Other Topics  Concern   Not on file  Social History Narrative   Not on file   Social Determinants of Health   Financial Resource Strain: Not on file  Food Insecurity: Not on file  Transportation Needs: Not on file  Physical Activity: Not on file  Stress: Not on file  Social Connections: Not on file  Intimate Partner Violence: Not on file    Past Medical History, Surgical history, Social history, and Family history were reviewed and updated as appropriate.   Please see review of systems for further details on the patient's review from today.   Objective:   Physical Exam:  There were no vitals taken for this visit.  Physical Exam Constitutional:      General: She is not in acute  distress. Musculoskeletal:        General: No deformity.  Neurological:     Mental Status: She is alert and oriented to person, place, and time.     Coordination: Coordination normal.  Psychiatric:        Attention and Perception: Attention and perception normal. She does not perceive auditory or visual hallucinations.        Mood and Affect: Mood normal. Mood is not anxious or depressed. Affect is not labile, blunt, angry or inappropriate.        Speech: Speech normal.        Behavior: Behavior normal.        Thought Content: Thought content normal. Thought content is not paranoid or delusional. Thought content does not include homicidal or suicidal ideation. Thought content does not include homicidal or suicidal plan.        Cognition and Memory: Cognition and memory normal.        Judgment: Judgment normal.     Comments: Insight intact     Lab Review:     Component Value Date/Time   NA 137 04/12/2022 0054   NA 139 01/17/2021 0926   K 3.5 04/12/2022 0054   CL 102 04/12/2022 0054   CO2 24 04/12/2022 0054   GLUCOSE 90 04/12/2022 0054   BUN 9 04/12/2022 0054   BUN 8 01/17/2021 0926   CREATININE 0.92 04/12/2022 0054   CALCIUM 10.3 04/12/2022 0054   PROT 8.4 (H) 02/28/2022 1504   PROT 7.3 01/17/2021  0926   ALBUMIN 4.8 02/28/2022 1504   ALBUMIN 4.9 01/17/2021 0926   AST 21 02/28/2022 1504   ALT 13 02/28/2022 1504   ALKPHOS 64 02/28/2022 1504   BILITOT 0.2 (L) 02/28/2022 1504   BILITOT 0.3 01/17/2021 0926   GFRNONAA >60 04/12/2022 0054       Component Value Date/Time   WBC 5.7 04/12/2022 0054   RBC 4.54 04/12/2022 0054   HGB 12.9 04/12/2022 0054   HGB 13.0 01/17/2021 0926   HCT 40.3 04/12/2022 0054   HCT 39.1 01/17/2021 0926   PLT 284 04/12/2022 0054   PLT 297 01/17/2021 0926   MCV 88.8 04/12/2022 0054   MCV 85 01/17/2021 0926   MCH 28.4 04/12/2022 0054   MCHC 32.0 04/12/2022 0054   RDW 12.9 04/12/2022 0054   RDW 11.8 01/17/2021 0926   LYMPHSABS 2.0 06/24/2021 1628   LYMPHSABS 1.8 01/17/2021 0926   MONOABS 0.5 06/24/2021 1628   EOSABS 0.1 06/24/2021 1628   EOSABS 0.1 01/17/2021 0926   BASOSABS 0.0 06/24/2021 1628   BASOSABS 0.0 01/17/2021 0926    No results found for: "POCLITH", "LITHIUM"   No results found for: "PHENYTOIN", "PHENOBARB", "VALPROATE", "CBMZ"   .res Assessment: Plan:    Plan:  PDMP reviewed  Gene sight testing reviewed  Increase Pristiq 25mg  to 50mg  daily.  Continue Temazepam - take one 15mg  at bedtime  Plan to set a therapist - trauma  RTC 2 weeks  Patient advised to contact office with any questions, adverse effects, or acute worsening in signs and symptoms.   Discussed potential benefits, risk, and side effects of benzodiazepines to include potential risk of tolerance and dependence, as well as possible drowsiness.  Advised patient not to drive if experiencing drowsiness and to take lowest possible effective dose to minimize risk of dependence and tolerance.   There are no diagnoses linked to this encounter.   Please see After Visit Summary for patient specific instructions.  Future Appointments  Date Time Provider Luck  05/30/2022 11:20 AM Tyeasha Ebbs, Berdie Ogren, NP [facility] None  06/25/2022 10:00 AM Talbot Grumbling, MD [facility] None    No orders of the defined types were placed in this encounter.     -------------------------------

## 2022-06-14 NOTE — Telephone Encounter (Signed)
Clonazepam was stopped when temazepam was started.

## 2022-06-14 NOTE — Telephone Encounter (Signed)
Temazepam last filled 3/21 for #15.

## 2022-06-14 NOTE — Telephone Encounter (Signed)
Clydie Braun at [place] called on behalf of pt @ 11:48a.  She said she can't get the Temazepam until 4/15;  she wants to know if we will appeal the denial for it.   Also pt wants Clonazepam 0.5mg  30days (60#) sent to [facility] [address].  No upcoming appt scheduled.

## 2022-06-17 NOTE — Telephone Encounter (Signed)
LVM to [facility] 

## 2022-06-20 NOTE — Telephone Encounter (Signed)
Left second VM to RC.  

## 2022-06-25 ENCOUNTER — Ambulatory Visit: Payer: No Typology Code available for payment source | Admitting: Dermatology

## 2022-06-26 ENCOUNTER — Telehealth: Payer: Self-pay | Admitting: Adult Health

## 2022-06-26 NOTE — Telephone Encounter (Signed)
[employer] called on behalf of the patient. She is requesting a PA for temazepam to be completed- says there is an unfinished PA on file. I mentioned to the rep that we may not have finished it for a reason and we have tried to reach out to the patient a few times about it. I recommended she let the patient know we will try again to contact her as I suspect that getting the temazepam thru [facility] is better. Please try Jordi at [phone]. Thanks.

## 2022-06-28 NOTE — Telephone Encounter (Signed)
Will contact pt to explain issue

## 2022-07-21 ENCOUNTER — Other Ambulatory Visit: Payer: Self-pay | Admitting: Adult Health

## 2022-07-21 DIAGNOSIS — F320 Major depressive disorder, single episode, mild: Secondary | ICD-10-CM

## 2022-07-21 DIAGNOSIS — R4589 Other symptoms and signs involving emotional state: Secondary | ICD-10-CM

## 2022-07-21 DIAGNOSIS — F422 Mixed obsessional thoughts and acts: Secondary | ICD-10-CM

## 2022-07-21 NOTE — Telephone Encounter (Signed)
Please call to schedule an appt, was due last month.  

## 2022-07-22 ENCOUNTER — Other Ambulatory Visit: Payer: Self-pay | Admitting: Adult Health

## 2022-07-22 DIAGNOSIS — F422 Mixed obsessional thoughts and acts: Secondary | ICD-10-CM

## 2022-07-22 DIAGNOSIS — F320 Major depressive disorder, single episode, mild: Secondary | ICD-10-CM

## 2022-07-22 DIAGNOSIS — R4589 Other symptoms and signs involving emotional state: Secondary | ICD-10-CM

## 2022-07-22 NOTE — Telephone Encounter (Signed)
Lvm for patient to call and schedule 

## 2022-09-28 ENCOUNTER — Other Ambulatory Visit (HOSPITAL_BASED_OUTPATIENT_CLINIC_OR_DEPARTMENT_OTHER): Payer: Self-pay

## 2022-09-28 ENCOUNTER — Emergency Department (HOSPITAL_BASED_OUTPATIENT_CLINIC_OR_DEPARTMENT_OTHER)
Admission: EM | Admit: 2022-09-28 | Discharge: 2022-09-28 | Disposition: A | Discharge status: Home / Self Care | Payer: No Typology Code available for payment source | Attending: Emergency Medicine | Admitting: Emergency Medicine

## 2022-09-28 DIAGNOSIS — R1084 Generalized abdominal pain: Secondary | ICD-10-CM | POA: Diagnosis not present

## 2022-09-28 DIAGNOSIS — R112 Nausea with vomiting, unspecified: Secondary | ICD-10-CM | POA: Diagnosis not present

## 2022-09-28 DIAGNOSIS — R000 Tachycardia, unspecified: Secondary | ICD-10-CM | POA: Diagnosis not present

## 2022-09-28 LAB — COMPREHENSIVE METABOLIC PANEL
ALT: 8 U/L (ref 0–44)
AST: 18 U/L (ref 15–41)
Albumin: 4.8 g/dL (ref 3.5–5.0)
Alkaline Phosphatase: 61 U/L (ref 38–126)
Anion gap: 10 (ref 5–15)
BUN: 11 mg/dL (ref 6–20)
CO2: 24 mmol/L (ref 22–32)
Calcium: 9.6 mg/dL (ref 8.9–10.3)
Chloride: 103 mmol/L (ref 98–111)
Creatinine, Ser: 0.87 mg/dL (ref 0.44–1.00)
GFR, Estimated: >60 mL/min (ref >60)
Glucose, Bld: 116 mg/dL — ABNORMAL HIGH (ref 70–99)
Potassium: 3.5 mmol/L (ref 3.5–5.1)
Sodium: 137 mmol/L (ref 135–145)
Total Bilirubin: 0.6 mg/dL (ref 0.3–1.2)
Total Protein: 8.3 g/dL — ABNORMAL HIGH (ref 6.5–8.1)

## 2022-09-28 LAB — CBC
HCT: 42 % (ref 36.0–46.0)
Hemoglobin: 13.7 g/dL (ref 12.0–15.0)
MCH: 28.7 pg (ref 26.0–34.0)
MCHC: 32.6 g/dL (ref 30.0–36.0)
MCV: 88.1 fL (ref 80.0–100.0)
Platelets: 306 10*3/uL (ref 150–400)
RBC: 4.77 MIL/uL (ref 3.87–5.11)
RDW: 12.7 % (ref 11.5–15.5)
WBC: 10.7 10*3/uL — ABNORMAL HIGH (ref 4.0–10.5)
nRBC: 0 % (ref 0.0–0.2)

## 2022-09-28 LAB — URINALYSIS, ROUTINE W REFLEX MICROSCOPIC
Bacteria, UA: NONE SEEN
Bilirubin Urine: NEGATIVE
Glucose, UA: NEGATIVE mg/dL
Hgb urine dipstick: NEGATIVE
Leukocytes,Ua: NEGATIVE
Nitrite: NEGATIVE
Protein, ur: 30 mg/dL — AB
Specific Gravity, Urine: 1.027 (ref 1.005–1.030)
pH: 6 (ref 5.0–8.0)

## 2022-09-28 LAB — PREGNANCY, URINE: Preg Test, Ur: NEGATIVE

## 2022-09-28 LAB — LIPASE, BLOOD: Lipase: 10 U/L — ABNORMAL LOW (ref 11–51)

## 2022-09-28 MED ORDER — ONDANSETRON HCL 8 MG PO TABS
8.0000 mg | ORAL_TABLET | Freq: Three times a day (TID) | ORAL | 0 refills | Status: DC | PRN
  Filled 2022-09-28: qty 20, 7d supply, fill #0

## 2022-09-28 MED ORDER — ONDANSETRON 4 MG PO TBDP
4.0000 mg | ORAL_TABLET | Freq: Once | ORAL | Status: AC | PRN
  Administered 2022-09-28: 4 mg via ORAL
  Filled 2022-09-28: qty 1

## 2022-09-28 MED ORDER — SODIUM CHLORIDE 0.9 % IV SOLN
12.5000 mg | Freq: Four times a day (QID) | INTRAVENOUS | Status: DC | PRN
  Administered 2022-09-28: 12.5 mg via INTRAVENOUS
  Filled 2022-09-28: qty 0.5

## 2022-09-28 MED ORDER — PROMETHAZINE HCL 25 MG/ML IJ SOLN
INTRAMUSCULAR | Status: AC
  Filled 2022-09-28: qty 1

## 2022-09-28 MED ORDER — LACTATED RINGERS IV BOLUS
1000.0000 mL | Freq: Once | INTRAVENOUS | Status: AC
  Administered 2022-09-28: 1000 mL via INTRAVENOUS

## 2022-09-28 NOTE — ED Notes (Signed)
Dc instructions reviewed with patient. Patient voiced understanding. Dc with belongings.  °

## 2022-09-28 NOTE — ED Triage Notes (Signed)
Pt c/o nausea, vomiting and generalized abdominal pain for 4 days. Unable to eat or drink much without worsening of symptoms and reports resting heart rate over 100bpm at home this morning.   NAD AAOx4 in triage.

## 2022-09-28 NOTE — ED Provider Notes (Addendum)
[facility] EMERGENCY DEPARTMENT AT [facility] Provider Note   CSN: [id number] Arrival date & time: 09/28/22  1138     History  Chief Complaint  Patient presents with   Nausea   Abdominal Pain    Kristina Blair is a 27 y.o. female.  HPI 27 year old female presents today complaining of nausea vomiting and generalized abdominal cramping for 4 days.  She states when she eats or drinks she is having vomiting.  Heart rate has gone up over the [place] several days with decreased p.o. intake to over 100 at home this morning.  States she has had some similar episodes in the past with no definitive diagnosis.  She denies fever, chills, diarrhea.  She denies urinary frequency or pain.  She reports that she has an IUD in place and has not had any abnormal vaginal discharge.  She denies pelvic pain     Home Medications Prior to Admission medications   Medication Sig Start Date End Date Taking? Authorizing Provider  ondansetron (ZOFRAN) 8 MG tablet Take 1 tablet (8 mg total) by mouth every 8 (eight) hours as needed for nausea or vomiting. 09/28/22  Yes Margarita Grizzle, MD  albuterol (VENTOLIN HFA) 108 (90 Base) MCG/ACT inhaler     [provider]  desvenlafaxine (PRISTIQ) 50 MG 24 hr tablet TAKE 1 TABLET BY MOUTH EVERY DAY 07/23/22   Mozingo, Thereasa Solo, NP  levonorgestrel (KYLEENA) 19.5 MG IUD 1 each by Intrauterine route once.    [provider]  Multiple Vitamins-Calcium (ONE-A-DAY WOMENS FORMULA) TABS Take 1 tablet by mouth daily with breakfast.    [provider]  propranolol (INDERAL) 10 MG tablet Take 1 tablet (10 mg total) by mouth 3 (three) times daily as needed. 04/17/22   Dulce Sellar, NP  temazepam (RESTORIL) 15 MG capsule Take one capsule at bedtime as needed for sleep. 05/30/22   Mozingo, Thereasa Solo, NP      Allergies    Kiwi extract and Reglan [metoclopramide]    Review of Systems   Review of Systems  Physical Exam Updated Vital  Signs BP 122/72   Pulse 83   Temp 98.3 F (36.8 C) (Oral)   Resp 15   SpO2 100%  Physical Exam Vitals and nursing note reviewed.  Constitutional:      General: She is not in acute distress.    Appearance: She is well-developed.  HENT:     Head: Normocephalic and atraumatic.     Right Ear: External ear normal.     Left Ear: External ear normal.     Nose: Nose normal.  Eyes:     Conjunctiva/sclera: Conjunctivae normal.     Pupils: Pupils are equal, round, and reactive to light.  Cardiovascular:     Rate and Rhythm: Tachycardia present.  Pulmonary:     Effort: Pulmonary effort is normal.  Abdominal:     General: Abdomen is flat. Bowel sounds are normal.     Palpations: Abdomen is soft.     Tenderness: There is no abdominal tenderness.  Musculoskeletal:        General: Normal range of motion.     Cervical back: Normal range of motion and neck supple.  Skin:    General: Skin is warm and dry.  Neurological:     Mental Status: She is alert and oriented to person, place, and time.     Motor: No abnormal muscle tone.     Coordination: Coordination normal.  Psychiatric:  Behavior: Behavior normal.        Thought Content: Thought content normal.     ED Results / Procedures / Treatments   Labs (all labs ordered are listed, but only abnormal results are displayed) Labs Reviewed  LIPASE, BLOOD - Abnormal; Notable for the following components:      Result Value   Lipase 10 (*)    All other components within normal limits  COMPREHENSIVE METABOLIC PANEL - Abnormal; Notable for the following components:   Glucose, Bld 116 (*)    Total Protein 8.3 (*)    All other components within normal limits  CBC - Abnormal; Notable for the following components:   WBC 10.7 (*)    All other components within normal limits  URINALYSIS, ROUTINE W REFLEX MICROSCOPIC - Abnormal; Notable for the following components:   Ketones, ur TRACE (*)    Protein, ur 30 (*)    All other components  within normal limits  PREGNANCY, URINE    EKG EKG Interpretation Date/Time:  Saturday September 28 2022 11:47:50 EDT Ventricular Rate:  115 PR Interval:  138 QRS Duration:  82 QT Interval:  302 QTC Calculation: 417 R Axis:   82  Text Interpretation: Sinus tachycardia Diffuse Non-specific ST-t changes likely rate related Confirmed by Margarita Grizzle [phone]) on 09/28/2022 1:54:49 PM  Radiology No results found.  Procedures Procedures    Medications Ordered in ED Medications  promethazine (PHENERGAN) 12.5 mg in sodium chloride 0.9 % 50 mL IVPB (0 mg Intravenous Stopped 09/28/22 1255)  promethazine (PHENERGAN) 25 MG/ML injection (has no administration in time range)  ondansetron (ZOFRAN-ODT) disintegrating tablet 4 mg (4 mg Oral Given 09/28/22 1155)  lactated ringers bolus 1,000 mL (1,000 mLs Intravenous New Bag/Given 09/28/22 1238)    ED Course/ Medical Decision Making/ A&P Clinical Course as of 09/28/22 1355  Sat Sep 28, 2022  1344 CBC reviewed interpreted within normal limits Complete metabolic panel reviewed interpreted within normal limits Urinalysis with squamous epithelial 0-5 and 0-5 red blood cells and 1 blood cells not consistent with acute UTI Pregnancy test is negative [DR]    Clinical Course User Index [DR] Margarita Grizzle, MD                             Medical Decision Making Amount and/or Complexity of Data Reviewed Labs: ordered.  Risk Prescription drug management.  Patient received 1 L LR and Zofran she states that she feels very improved.  She received ondansetron.  She reports that she has been eating crackers while in the room with no vomiting. Patient appears stable for discharge she is given prescription for ondansetron.  We have discussed return precautions and she voiced understanding.         Final Clinical Impression(s) / ED Diagnoses Final diagnoses:  Nausea and vomiting, unspecified vomiting type    Rx / DC Orders ED Discharge Orders           Ordered    ondansetron (ZOFRAN) 8 MG tablet  Every 8 hours PRN        09/28/22 1346              Margarita Grizzle, MD 09/28/22 1346    Margarita Grizzle, MD 09/28/22 1355

## 2022-09-28 NOTE — ED Notes (Signed)
Pt ambulatory with steady gait to restroom, will attempt to provide urine

## 2022-09-29 ENCOUNTER — Other Ambulatory Visit: Payer: Self-pay | Admitting: Family

## 2022-09-29 DIAGNOSIS — R000 Tachycardia, unspecified: Secondary | ICD-10-CM

## 2022-10-07 ENCOUNTER — Telehealth: Payer: Self-pay | Admitting: Family

## 2022-10-07 NOTE — Telephone Encounter (Signed)
Patient is requesting to transfer care from [facility] to [facility]. Is this okay with you?

## 2022-10-09 NOTE — Telephone Encounter (Signed)
Called and informed patient of decline for [facility] request. I asked pt if there was someone else she had in mind and she stated she didn't Pt verbalized understanding of decline and stated she would try again in a couple of months.

## 2022-11-01 ENCOUNTER — Encounter (HOSPITAL_BASED_OUTPATIENT_CLINIC_OR_DEPARTMENT_OTHER): Payer: Self-pay | Admitting: Emergency Medicine

## 2022-11-01 ENCOUNTER — Other Ambulatory Visit (HOSPITAL_BASED_OUTPATIENT_CLINIC_OR_DEPARTMENT_OTHER): Payer: Self-pay

## 2022-11-01 ENCOUNTER — Emergency Department (HOSPITAL_BASED_OUTPATIENT_CLINIC_OR_DEPARTMENT_OTHER)
Admission: EM | Admit: 2022-11-01 | Discharge: 2022-11-01 | Disposition: A | Discharge status: Home / Self Care | Payer: No Typology Code available for payment source | Attending: Emergency Medicine | Admitting: Emergency Medicine

## 2022-11-01 ENCOUNTER — Other Ambulatory Visit: Payer: Self-pay

## 2022-11-01 DIAGNOSIS — J449 Chronic obstructive pulmonary disease, unspecified: Secondary | ICD-10-CM | POA: Insufficient documentation

## 2022-11-01 DIAGNOSIS — M549 Dorsalgia, unspecified: Secondary | ICD-10-CM | POA: Insufficient documentation

## 2022-11-01 DIAGNOSIS — U071 COVID-19: Secondary | ICD-10-CM

## 2022-11-01 LAB — CBC WITH DIFFERENTIAL/PLATELET
Abs Immature Granulocytes: 0.02 10*3/uL (ref 0.00–0.07)
Basophils Absolute: 0 10*3/uL (ref 0.0–0.1)
Basophils Relative: 0 %
Eosinophils Absolute: 0 10*3/uL (ref 0.0–0.5)
Eosinophils Relative: 0 %
HCT: 41.5 % (ref 36.0–46.0)
Hemoglobin: 13.3 g/dL (ref 12.0–15.0)
Immature Granulocytes: 0 %
Lymphocytes Relative: 5 %
Lymphs Abs: 0.4 10*3/uL — ABNORMAL LOW (ref 0.7–4.0)
MCH: 28.9 pg (ref 26.0–34.0)
MCHC: 32 g/dL (ref 30.0–36.0)
MCV: 90.2 fL (ref 80.0–100.0)
Monocytes Absolute: 1 10*3/uL (ref 0.1–1.0)
Monocytes Relative: 12 %
Neutro Abs: 7.1 10*3/uL (ref 1.7–7.7)
Neutrophils Relative %: 83 %
Platelets: 296 10*3/uL (ref 150–400)
RBC: 4.6 MIL/uL (ref 3.87–5.11)
RDW: 13.2 % (ref 11.5–15.5)
WBC: 8.6 10*3/uL (ref 4.0–10.5)
nRBC: 0 % (ref 0.0–0.2)

## 2022-11-01 LAB — BASIC METABOLIC PANEL
Anion gap: 7 (ref 5–15)
BUN: 9 mg/dL (ref 6–20)
CO2: 23 mmol/L (ref 22–32)
Calcium: 8.9 mg/dL (ref 8.9–10.3)
Chloride: 106 mmol/L (ref 98–111)
Creatinine, Ser: 0.86 mg/dL (ref 0.44–1.00)
GFR, Estimated: >60 mL/min (ref >60)
Glucose, Bld: 100 mg/dL — ABNORMAL HIGH (ref 70–99)
Potassium: 4.2 mmol/L (ref 3.5–5.1)
Sodium: 136 mmol/L (ref 135–145)

## 2022-11-01 LAB — URINALYSIS, ROUTINE W REFLEX MICROSCOPIC
Bilirubin Urine: NEGATIVE
Glucose, UA: NEGATIVE mg/dL
Hgb urine dipstick: NEGATIVE
Ketones, ur: NEGATIVE mg/dL
Leukocytes,Ua: NEGATIVE
Nitrite: NEGATIVE
Specific Gravity, Urine: 1.021 (ref 1.005–1.030)
pH: 7 (ref 5.0–8.0)

## 2022-11-01 LAB — SARS CORONAVIRUS 2 BY RT PCR: SARS Coronavirus 2 by RT PCR: POSITIVE — AB

## 2022-11-01 LAB — PREGNANCY, URINE: Preg Test, Ur: NEGATIVE

## 2022-11-01 MED ORDER — SODIUM CHLORIDE 0.9 % IV BOLUS
1000.0000 mL | Freq: Once | INTRAVENOUS | Status: AC
  Administered 2022-11-01: 1000 mL via INTRAVENOUS

## 2022-11-01 MED ORDER — ONDANSETRON 4 MG PO TBDP
ORAL_TABLET | ORAL | 0 refills | Status: DC
  Filled 2022-11-01: qty 20, 5d supply, fill #0

## 2022-11-01 MED ORDER — BENZONATATE 100 MG PO CAPS
100.0000 mg | ORAL_CAPSULE | Freq: Three times a day (TID) | ORAL | 0 refills | Status: DC
  Filled 2022-11-01: qty 21, 7d supply, fill #0

## 2022-11-01 MED ORDER — ACETAMINOPHEN 500 MG PO TABS
1000.0000 mg | ORAL_TABLET | Freq: Once | ORAL | Status: AC
  Administered 2022-11-01: 1000 mg via ORAL
  Filled 2022-11-01: qty 2

## 2022-11-01 NOTE — ED Provider Notes (Signed)
[place] EMERGENCY DEPARTMENT AT [facility] Provider Note   CSN: [id number] Arrival date & time: 11/01/22  4098     History  Chief Complaint  Patient presents with   Tachycardia   Back Pain    Kristina Blair is a 27 y.o. female.  27 yo F with a chief complaints of not feeling well.  Cough subjective fevers and chills at home.  Feeling her heart was racing this morning and so came in to be evaluated.  She has a family member who has been coughing a bit more than normal though they have COPD so did not think much of it.  No other known sick contacts.  Feels like she has been eating and drinking normally.  No abdominal pain no urinary symptoms.   Back Pain      Home Medications Prior to Admission medications   Medication Sig Start Date End Date Taking? Authorizing Provider  benzonatate (TESSALON) 100 MG capsule Take 1 capsule (100 mg total) by mouth every 8 (eight) hours. 11/01/22  Yes Melene Plan, DO  ondansetron (ZOFRAN-ODT) 4 MG disintegrating tablet Take 1 tablet under the tongue every 4 hours  as  needed for nausea 11/01/22  Yes Melene Plan, DO  albuterol (VENTOLIN HFA) 108 (90 Base) MCG/ACT inhaler     [provider]  desvenlafaxine (PRISTIQ) 50 MG 24 hr tablet TAKE 1 TABLET BY MOUTH EVERY DAY 07/23/22   Mozingo, Thereasa Solo, NP  levonorgestrel (KYLEENA) 19.5 MG IUD 1 each by Intrauterine route once.    [provider]  Multiple Vitamins-Calcium (ONE-A-DAY WOMENS FORMULA) TABS Take 1 tablet by mouth daily with breakfast.    [provider]  ondansetron (ZOFRAN) 8 MG tablet Take 1 tablet (8 mg total) by mouth every 8 (eight) hours as needed for nausea or vomiting. 09/28/22   Margarita Grizzle, MD  propranolol (INDERAL) 10 MG tablet TAKE 1 TABLET BY MOUTH THREE TIMES A DAY AS NEEDED *INS ONLY COVERS 90 DAYS 09/30/22   Hudnell, Judeth Cornfield, NP  temazepam (RESTORIL) 15 MG capsule Take one capsule at bedtime as needed for sleep. 05/30/22   Mozingo, Thereasa Solo, NP      Allergies    Kiwi extract and Reglan [metoclopramide]    Review of Systems   Review of Systems  Musculoskeletal:  Positive for back pain.    Physical Exam Updated Vital Signs BP 124/87   Pulse (!) 118   Temp (!) 100.5 F (38.1 C) (Oral)   Resp 18   Wt 63.5 kg   LMP  (LMP Unknown)   SpO2 100%   BMI 26.45 kg/m  Physical Exam Vitals and nursing note reviewed.  Constitutional:      General: She is not in acute distress.    Appearance: She is well-developed. She is not diaphoretic.  HENT:     Head: Normocephalic and atraumatic.  Eyes:     Pupils: Pupils are equal, round, and reactive to light.  Cardiovascular:     Rate and Rhythm: Regular rhythm. Tachycardia present.     Heart sounds: No murmur heard.    No friction rub. No gallop.  Pulmonary:     Effort: Pulmonary effort is normal.     Breath sounds: No wheezing or rales.  Abdominal:     General: There is no distension.     Palpations: Abdomen is soft.     Tenderness: There is no abdominal tenderness.  Musculoskeletal:        General: No tenderness.  Cervical back: Normal range of motion and neck supple.  Skin:    General: Skin is warm and dry.  Neurological:     Mental Status: She is alert and oriented to person, place, and time.  Psychiatric:        Behavior: Behavior normal.     ED Results / Procedures / Treatments   Labs (all labs ordered are listed, but only abnormal results are displayed) Labs Reviewed  SARS CORONAVIRUS 2 BY RT PCR - Abnormal; Notable for the following components:      Result Value   SARS Coronavirus 2 by RT PCR POSITIVE (*)    All other components within normal limits  BASIC METABOLIC PANEL - Abnormal; Notable for the following components:   Glucose, Bld 100 (*)    All other components within normal limits  CBC WITH DIFFERENTIAL/PLATELET - Abnormal; Notable for the following components:   Lymphs Abs 0.4 (*)    All other components within normal limits   URINALYSIS, ROUTINE W REFLEX MICROSCOPIC - Abnormal; Notable for the following components:   Protein, ur TRACE (*)    All other components within normal limits  PREGNANCY, URINE    EKG EKG Interpretation Date/Time:  Friday November 01 2022 06:17:09 EDT Ventricular Rate:  135 PR Interval:  138 QRS Duration:  74 QT Interval:  271 QTC Calculation: 407 R Axis:   63  Text Interpretation: Sinus tachycardia Borderline repolarization abnormality Baseline wander in lead(s) I aVR V2 No significant change since last tracing Confirmed by Melene Plan [phone]) on 11/01/2022 6:55:26 AM  Radiology No results found.  Procedures Procedures    Medications Ordered in ED Medications  acetaminophen (TYLENOL) tablet 1,000 mg (1,000 mg Oral Given 11/01/22 0656)  sodium chloride 0.9 % bolus 1,000 mL (1,000 mLs Intravenous New Bag/Given 11/01/22 0715)    ED Course/ Medical Decision Making/ A&P                                 Medical Decision Making Risk Prescription drug management.   27 yo F with a chief complaints of a fever and cough.  Going on for a couple days now.  She is well-appearing nontoxic.  Heart rate up into the 140s on arrival.  On my record review the patient does seem to present tachycardic not uncommonly to the emergency department.  She is febrile here.  Will give a bolus of IV fluids Tylenol check blood work.  UA negative for infection on my independent interpretation pregnancy test negative.  Clear lung sounds without any tachypnea I doubt she would benefit from chest imaging at this time.  No other obvious bacterial source found on exam.  I wonder if the patient's tachycardia could be due to a rebound effect by missing a dose of her propranolol.  Patient's blood work is resulted without anemia, no leukocytosis, no significant electrolyte abnormalities.  Her COVID test is positive here.  Suspect this explains her symptoms.  Will discharge home.  PCP follow-up.  8:05 AM:  I have  discussed the diagnosis/risks/treatment options with the patient.  Evaluation and diagnostic testing in the emergency department does not suggest an emergent condition requiring admission or immediate intervention beyond what has been performed at this time.  They will follow up with PCP. We also discussed returning to the ED immediately if new or worsening sx occur. We discussed the sx which are most concerning (e.g., sudden worsening pain, fever,  inability to tolerate by mouth) that necessitate immediate return. Medications administered to the patient during their visit and any new prescriptions provided to the patient are listed below.  Medications given during this visit Medications  acetaminophen (TYLENOL) tablet 1,000 mg (1,000 mg Oral Given 11/01/22 0656)  sodium chloride 0.9 % bolus 1,000 mL (1,000 mLs Intravenous New Bag/Given 11/01/22 0715)     The patient appears reasonably screen and/or stabilized for discharge and I doubt any other medical condition or other [facility] requiring further screening, evaluation, or treatment in the ED at this time prior to discharge.          Final Clinical Impression(s) / ED Diagnoses Final diagnoses:  COVID-19 virus infection    Rx / DC Orders ED Discharge Orders          Ordered    ondansetron (ZOFRAN-ODT) 4 MG disintegrating tablet        11/01/22 0742    benzonatate (TESSALON) 100 MG capsule  Every 8 hours        11/01/22 0742              Melene Plan, DO 11/01/22 0102

## 2022-11-01 NOTE — ED Triage Notes (Addendum)
Pt in reporting feelings of heart racing when she woke up. She took her pulse at home and it was in the 150's. Hx of SVT. States she feels like she has a fever, +sob. Denies any cp, does endorse low back pain and urinary frequency that began 3 days ago. Grandmother also sick with virus at home

## 2022-11-01 NOTE — Discharge Instructions (Addendum)
You have covid.  This usually will get better on its own.  I have prescribed you cough and nausea medicine in case you need it.  If you are still taking the propranolol, continue to take it regularly or stopping it will make your heart race.  Take tylenol 2 pills 4 times a day and motrin 4 pills 3 times a day.  Drink plenty of fluids.  Return for worsening shortness of breath, headache, confusion. Follow up with your family doctor.

## 2022-11-01 NOTE — ED Notes (Signed)
Pt discharged to home using teachback Method. Discharge instructions have been discussed with patient and/or family members. Pt verbally acknowledges understanding d/c instructions, has been given opportunity for questions to be answered, and endorses comprehension to checkout at registration before leaving.  

## 2022-11-12 ENCOUNTER — Other Ambulatory Visit (HOSPITAL_BASED_OUTPATIENT_CLINIC_OR_DEPARTMENT_OTHER): Payer: Self-pay

## 2022-11-12 ENCOUNTER — Other Ambulatory Visit: Payer: Self-pay | Admitting: Adult Health

## 2022-11-12 DIAGNOSIS — R4589 Other symptoms and signs involving emotional state: Secondary | ICD-10-CM

## 2022-12-03 ENCOUNTER — Other Ambulatory Visit (HOSPITAL_COMMUNITY)
Admission: RE | Admit: 2022-12-03 | Discharge: 2022-12-03 | Disposition: A | Discharge status: Home / Self Care | Payer: Medicaid Other | Source: Ambulatory Visit | Attending: Advanced Practice Midwife | Admitting: Advanced Practice Midwife

## 2022-12-03 ENCOUNTER — Encounter (HOSPITAL_BASED_OUTPATIENT_CLINIC_OR_DEPARTMENT_OTHER): Payer: Self-pay | Admitting: Advanced Practice Midwife

## 2022-12-03 ENCOUNTER — Ambulatory Visit (INDEPENDENT_AMBULATORY_CARE_PROVIDER_SITE_OTHER): Payer: Medicaid Other | Admitting: Advanced Practice Midwife

## 2022-12-03 ENCOUNTER — Other Ambulatory Visit (HOSPITAL_BASED_OUTPATIENT_CLINIC_OR_DEPARTMENT_OTHER): Payer: Self-pay

## 2022-12-03 VITALS — BP 122/83 | HR 76 | Ht 61.0 in | Wt 152.8 lb

## 2022-12-03 DIAGNOSIS — Z01419 Encounter for gynecological examination (general) (routine) without abnormal findings: Secondary | ICD-10-CM

## 2022-12-03 DIAGNOSIS — N644 Mastodynia: Secondary | ICD-10-CM

## 2022-12-03 DIAGNOSIS — Z113 Encounter for screening for infections with a predominantly sexual mode of transmission: Secondary | ICD-10-CM | POA: Diagnosis present

## 2022-12-03 DIAGNOSIS — Z30431 Encounter for routine checking of intrauterine contraceptive device: Secondary | ICD-10-CM

## 2022-12-03 DIAGNOSIS — Z124 Encounter for screening for malignant neoplasm of cervix: Secondary | ICD-10-CM | POA: Insufficient documentation

## 2022-12-03 DIAGNOSIS — R102 Pelvic and perineal pain unspecified side: Secondary | ICD-10-CM

## 2022-12-03 DIAGNOSIS — N939 Abnormal uterine and vaginal bleeding, unspecified: Secondary | ICD-10-CM

## 2022-12-03 DIAGNOSIS — T839XXA Unspecified complication of genitourinary prosthetic device, implant and graft, initial encounter: Secondary | ICD-10-CM

## 2022-12-03 DIAGNOSIS — R232 Flushing: Secondary | ICD-10-CM

## 2022-12-03 NOTE — Progress Notes (Signed)
Subjective:     Kristina Blair is a 27 y.o. female here at [facility] for a routine exam.  Current complaints: Pt has IUD and had rare bleeding until the last few months when she started having periods every 2 weeks, hot flashes, and breast tenderness.  Personal health history reviewed: yes.  Do you have a primary care provider? yes Do you feel safe at home? yes  Flowsheet Row Office Visit from 12/03/2022 in [facility] for [facility] Total Score 0       Health Maintenance Due  Topic Date Due   Hepatitis C Screening  Never done   INFLUENZA VACCINE  10/03/2022   COVID-19 Vaccine (4 - 2023-24 season) 11/03/2022     Risk factors for chronic health problems: Smoking: never Alchohol/how much: never Pt BMI: Body mass index is 28.87 kg/m.   Gynecologic History No LMP recorded. (Menstrual status: IUD). Contraception: IUD Last Pap: unknown. Results were: normal Last mammogram: n/a.   Obstetric History OB History  Gravida Para Term Preterm AB Living  0 0 0 0 0 0  SAB IAB Ectopic Multiple Live Births  0 0 0 0 0     The following portions of the patient's history were reviewed and updated as appropriate: allergies, current medications, past family history, past medical history, past social history, past surgical history, and problem list.  Review of Systems Pertinent items noted in HPI and remainder of comprehensive ROS otherwise negative.    Objective:  BP 122/83 (BP Location: Left Arm, Patient Position: Sitting, Cuff Size: Normal)   Pulse 76   Ht 5\' 1"  (1.549 m)   Wt 152 lb 12.8 oz (69.3 kg)   BMI 28.87 kg/m   VS reviewed, nursing note reviewed,  Constitutional: well developed, well nourished, no distress HEENT: normocephalic, thyroid without enlargement or mass HEART: RRR, no murmurs rubs/gallops RESP: clear and equal to auscultation bilaterally in all lobes  Breast Exam:  exam performed: right breast normal without mass,  skin or nipple changes or axillary nodes, left breast normal without mass, skin or nipple changes or axillary nodes Abdomen: soft Neuro: alert and oriented x 3 Skin: warm, dry Psych: affect normal Pelvic exam: Performed: Cervix pink, visually closed, IUD string visible, ~ 3 cm in length from cervical os, without lesion, scant white creamy discharge, vaginal walls and external genitalia normal Bimanual exam: Cervix 0/long/high, firm, anterior, neg CMT, uterus nontender, nonenlarged, adnexa without tenderness, enlargement, or mass        Assessment/Plan:   1. Encounter for annual routine gynecological examination   2. Hot flashes  - TSH - FSH - LH - Testosterone - Beta hCG quant (ref lab) - Estradiol  3. Breast tenderness -Breast exam wnl. Family hx breast cancer, testing for BRCA negative. --Reviewed importance of self breast exams  - Beta hCG quant (ref lab) - Prolactin  4. Abnormal uterine bleeding (AUB)  - TSH  5. Screening examination for STI  - HIV antibody (with reflex) - RPR - Hepatitis B Surface AntiGEN - Hepatitis C Antibody - Cytology - PAP( [place])  6. Cervical cancer screening  - Cytology - PAP( [place])    7. IUD check up --IUD wnl    8. Abnormal uterine bleeding  - US PELVIC COMPLETE WITH TRANSVAGINAL; Future  9. Pelvic pain in female  - US PELVIC COMPLETE WITH TRANSVAGINAL; Future  10. Complication of intrauterine device (IUD), unspecified complication, initial encounter (HCC)  - US  PELVIC COMPLETE WITH TRANSVAGINAL; Future   Return for As scheduled.   Sharen Counter, CNM 5:34 PM

## 2022-12-04 LAB — FOLLICLE STIMULATING HORMONE: FSH: 16.2 m[IU]/mL

## 2022-12-04 LAB — PROLACTIN: Prolactin: 5.5 ng/mL (ref 4.8–33.4)

## 2022-12-04 LAB — TSH: TSH: 1.52 u[IU]/mL (ref 0.450–4.500)

## 2022-12-04 LAB — ESTRADIOL: Estradiol: 24.2 pg/mL

## 2022-12-04 LAB — RPR: RPR Ser Ql: NONREACTIVE

## 2022-12-04 LAB — BETA HCG QUANT (REF LAB): hCG Quant: <1 m[IU]/mL

## 2022-12-04 LAB — HEPATITIS B SURFACE ANTIGEN: Hepatitis B Surface Ag: NEGATIVE

## 2022-12-04 LAB — TESTOSTERONE: Testosterone: 32 ng/dL (ref 13–71)

## 2022-12-04 LAB — HIV ANTIBODY (ROUTINE TESTING W REFLEX): HIV Screen 4th Generation wRfx: NONREACTIVE

## 2022-12-04 LAB — LUTEINIZING HORMONE: LH: 13.2 m[IU]/mL

## 2022-12-04 LAB — HEPATITIS C ANTIBODY: Hep C Virus Ab: NONREACTIVE

## 2022-12-09 LAB — CYTOLOGY - PAP
Chlamydia: NEGATIVE
Comment: NEGATIVE
Comment: NEGATIVE
Comment: NORMAL
Diagnosis: NEGATIVE
Neisseria Gonorrhea: NEGATIVE
Trichomonas: NEGATIVE

## 2023-01-20 ENCOUNTER — Other Ambulatory Visit: Payer: Self-pay | Admitting: Family

## 2023-01-20 DIAGNOSIS — R000 Tachycardia, unspecified: Secondary | ICD-10-CM

## 2023-01-22 ENCOUNTER — Ambulatory Visit: Payer: No Typology Code available for payment source | Admitting: Dermatology

## 2023-02-04 ENCOUNTER — Ambulatory Visit (HOSPITAL_BASED_OUTPATIENT_CLINIC_OR_DEPARTMENT_OTHER): Payer: Medicaid Other | Admitting: Family Medicine

## 2023-02-14 ENCOUNTER — Ambulatory Visit (INDEPENDENT_AMBULATORY_CARE_PROVIDER_SITE_OTHER): Payer: Medicaid Other | Admitting: Family Medicine

## 2023-02-14 ENCOUNTER — Encounter (HOSPITAL_BASED_OUTPATIENT_CLINIC_OR_DEPARTMENT_OTHER): Payer: Self-pay | Admitting: Family Medicine

## 2023-02-14 VITALS — BP 125/88 | HR 94 | Ht 61.0 in | Wt 153.2 lb

## 2023-02-14 DIAGNOSIS — R300 Dysuria: Secondary | ICD-10-CM | POA: Diagnosis not present

## 2023-02-14 LAB — POCT URINALYSIS DIP (CLINITEK)
Bilirubin, UA: NEGATIVE
Glucose, UA: NEGATIVE mg/dL
Ketones, POC UA: NEGATIVE mg/dL
Leukocytes, UA: NEGATIVE
Nitrite, UA: NEGATIVE
POC PROTEIN,UA: NEGATIVE
Spec Grav, UA: 1.025 (ref 1.010–1.025)
Urobilinogen, UA: 0.2 U/dL
pH, UA: 6 (ref 5.0–8.0)

## 2023-02-14 NOTE — Patient Instructions (Signed)
  Medication Instructions:  Your physician recommends that you continue on your current medications as directed. Please refer to the Current Medication list given to you today. --If you need a refill on any your medications before your next appointment, please call your pharmacy first. If no refills are authorized on file call the office.--   Follow-Up: Your next appointment:   Your physician recommends that you schedule a follow-up appointment in: New Patient Appointment with Jerre Simon NP2  You will receive a text message or e-mail with a link to a survey about your care and experience with [place] today! We would greatly appreciate your feedback!   Thanks for letting us be apart of your health journey!!  Primary Care and Sports Medicine   Dr. Ceasar Mons [place]   We encourage you to activate your patient portal called "MyChart".  Sign up information is provided on this After Visit Summary.  MyChart is used to connect with patients for Virtual Visits (Telemedicine).  Patients are able to view lab/test results, encounter notes, upcoming appointments, etc.  Non-urgent messages can be sent to your provider as well. To learn more about what you can do with MyChart, please visit --  [email or web address].

## 2023-02-14 NOTE — Progress Notes (Unsigned)
    Procedures performed today:    None.  Independent interpretation of notes and tests performed by another provider:   None.  Brief History, Exam, Impression, and Recommendations:    BP 125/88 (BP Location: Right Arm, Patient Position: Sitting, Cuff Size: Normal)   Pulse 94   Ht 5\' 1"  (1.549 m)   Wt 153 lb 3.2 oz (69.5 kg)   SpO2 100%   BMI 28.95 kg/m   Patient presenting for acute issue.  She previously was being seen in our office as primary care patient, however she did establish with different office locally and has been following with them for about 2 years.  She is interested in reestablishing with our practice.  Today, she is being seen for her acute issue and will have office visit in the near future to reestablish in our practice here.  Dysuria Assessment & Plan: Patient reports that she initially noted mild dysuria last night with some worsening this morning.  She has not noticed any change in urine color, odor.  She thinks that she may have slight discharge currently.  She has not had any fever, chills, sweats, no abdominal pain.  Has not noted any urinary frequency. On exam, patient is in no acute distress, vital signs stable.  Cardiovascular exam regular rate and rhythm, lungs clear to auscultation bilaterally.  Abdomen with normal bowel sounds, soft, nontender, nondistended, no suprapubic tenderness.  No CVA tenderness present. Symptoms suggestive of possible underlying UTI.  Urine dip in office is generally unremarkable with only trace hematuria noted, negative nitrites and leukocyte esterase.  Given laboratory findings, would hold off on empiric antibiotic therapy for now.  We will  Orders: -     POCT URINALYSIS DIP (CLINITEK) -     Urinalysis, Routine w reflex microscopic -     Urine Culture  Other orders -     Urinalysis, Routine w reflex microscopic  Return for establish with Jon Gills at earliest  convenience.   ___________________________________________ Kristina Qin de [place], MD, ABFM, [facility] Primary Care and Sports Medicine [facility]

## 2023-02-14 NOTE — Assessment & Plan Note (Signed)
Patient reports that she initially noted mild dysuria last night with some worsening this morning.  She has not noticed any change in urine color, odor.  She thinks that she may have slight discharge currently.  She has not had any fever, chills, sweats, no abdominal pain.  Has not noted any urinary frequency. On exam, patient is in no acute distress, vital signs stable.  Cardiovascular exam regular rate and rhythm, lungs clear to auscultation bilaterally.  Abdomen with normal bowel sounds, soft, nontender, nondistended, no suprapubic tenderness.  No CVA tenderness present. Symptoms suggestive of possible underlying UTI.  Urine dip in office is generally unremarkable with only trace hematuria noted, negative nitrites and leukocyte esterase.  Given laboratory findings, would hold off on empiric antibiotic therapy for now.  We will

## 2023-02-16 LAB — URINALYSIS, ROUTINE W REFLEX MICROSCOPIC
Bilirubin, UA: NEGATIVE
Glucose, UA: NEGATIVE
Ketones, UA: NEGATIVE
Leukocytes,UA: NEGATIVE
Nitrite, UA: NEGATIVE
RBC, UA: NEGATIVE
Specific Gravity, UA: 1.022 (ref 1.005–1.030)
Urobilinogen, Ur: 0.2 mg/dL (ref 0.2–1.0)
pH, UA: 6 (ref 5.0–7.5)

## 2023-02-16 LAB — URINE CULTURE

## 2023-02-20 ENCOUNTER — Encounter (HOSPITAL_BASED_OUTPATIENT_CLINIC_OR_DEPARTMENT_OTHER): Payer: Self-pay

## 2023-02-20 ENCOUNTER — Ambulatory Visit (HOSPITAL_BASED_OUTPATIENT_CLINIC_OR_DEPARTMENT_OTHER): Payer: Medicaid Other | Admitting: Family Medicine

## 2023-02-20 ENCOUNTER — Ambulatory Visit: Payer: Medicaid Other | Admitting: Family

## 2023-02-20 ENCOUNTER — Ambulatory Visit (HOSPITAL_BASED_OUTPATIENT_CLINIC_OR_DEPARTMENT_OTHER): Payer: Self-pay

## 2023-03-10 ENCOUNTER — Ambulatory Visit (HOSPITAL_BASED_OUTPATIENT_CLINIC_OR_DEPARTMENT_OTHER): Payer: Self-pay

## 2023-03-18 ENCOUNTER — Ambulatory Visit (HOSPITAL_BASED_OUTPATIENT_CLINIC_OR_DEPARTMENT_OTHER): Payer: Self-pay

## 2023-05-11 ENCOUNTER — Other Ambulatory Visit: Payer: Self-pay

## 2023-05-11 ENCOUNTER — Encounter (HOSPITAL_COMMUNITY): Payer: Self-pay | Admitting: Emergency Medicine

## 2023-05-11 ENCOUNTER — Emergency Department (HOSPITAL_COMMUNITY)

## 2023-05-11 ENCOUNTER — Emergency Department (HOSPITAL_COMMUNITY)
Admission: EM | Admit: 2023-05-11 | Discharge: 2023-05-11 | Disposition: A | Discharge status: Home / Self Care | Attending: Emergency Medicine | Admitting: Emergency Medicine

## 2023-05-11 DIAGNOSIS — R079 Chest pain, unspecified: Secondary | ICD-10-CM

## 2023-05-11 DIAGNOSIS — R0789 Other chest pain: Secondary | ICD-10-CM | POA: Diagnosis not present

## 2023-05-11 LAB — BASIC METABOLIC PANEL
Anion gap: 6 (ref 5–15)
BUN: 12 mg/dL (ref 6–20)
CO2: 23 mmol/L (ref 22–32)
Calcium: 9.2 mg/dL (ref 8.9–10.3)
Chloride: 103 mmol/L (ref 98–111)
Creatinine, Ser: 0.89 mg/dL (ref 0.44–1.00)
GFR, Estimated: >60 mL/min (ref >60)
Glucose, Bld: 113 mg/dL — ABNORMAL HIGH (ref 70–99)
Potassium: 4.1 mmol/L (ref 3.5–5.1)
Sodium: 132 mmol/L — ABNORMAL LOW (ref 135–145)

## 2023-05-11 LAB — CBC
HCT: 41.5 % (ref 36.0–46.0)
Hemoglobin: 12.9 g/dL (ref 12.0–15.0)
MCH: 28.5 pg (ref 26.0–34.0)
MCHC: 31.1 g/dL (ref 30.0–36.0)
MCV: 91.6 fL (ref 80.0–100.0)
Platelets: 341 10*3/uL (ref 150–400)
RBC: 4.53 MIL/uL (ref 3.87–5.11)
RDW: 12.8 % (ref 11.5–15.5)
WBC: 9.4 10*3/uL (ref 4.0–10.5)
nRBC: 0 % (ref 0.0–0.2)

## 2023-05-11 LAB — TROPONIN I (HIGH SENSITIVITY): Troponin I (High Sensitivity): <2 ng/L (ref <18)

## 2023-05-11 LAB — HCG, SERUM, QUALITATIVE: Preg, Serum: NEGATIVE

## 2023-05-11 NOTE — ED Triage Notes (Signed)
 Pt laying in bed and started to have sharp shooting pain in center and right of chest. Had a nose bleed right after but has since stopped.

## 2023-05-11 NOTE — Discharge Instructions (Signed)
 You are blood tests are reassuring.    Follow up with your family doc in the office.    Please return for worsening symptoms if you cough up blood or if you pass out.

## 2023-05-11 NOTE — ED Provider Notes (Signed)
 [place] EMERGENCY DEPARTMENT AT [facility] Provider Note   CSN: [id number] Arrival date & time: 05/11/23  4098     History  Chief Complaint  Patient presents with   Chest Pain    Kristina Blair is a 28 y.o. female.  28 yo with a chief complaints of chest pain.  This is right-sided and occurs with certain positions.  Going on since yesterday.  She denies trauma to the area denies congestion or fever.  She coughs intermittently she says maybe once at a time when she lies on her left side.  Her mom had a heart attack in her 75s.  She was concerned about her symptoms and came in for evaluation.  Patient denies history of MI, denies hypertension hyperlipidemia diabetes or smoking.  Mom had an MI in her 47s. Patient denies history of PE or DVT denies hemoptysis denies unilateral lower extremity edema denies recent surgery immobilization hospitalization estrogen use or history of cancer.     Chest Pain      Home Medications Prior to Admission medications   Medication Sig Start Date End Date Taking? Authorizing Provider  levonorgestrel (KYLEENA) 19.5 MG IUD 1 each by Intrauterine route once.    [provider]  Multiple Vitamins-Calcium (ONE-A-DAY WOMENS FORMULA) TABS Take 1 tablet by mouth daily with breakfast.    [provider]      Allergies    Kiwi extract and Reglan [metoclopramide]    Review of Systems   Review of Systems  Cardiovascular:  Positive for chest pain.    Physical Exam Updated Vital Signs BP 126/84   Pulse 86   Temp 98.8 F (37.1 C)   Resp 16   Ht 5\' 1"  (1.549 m)   Wt 70.3 kg   SpO2 100%   BMI 29.29 kg/m  Physical Exam Vitals and nursing note reviewed.  Constitutional:      General: She is not in acute distress.    Appearance: She is well-developed. She is not diaphoretic.  HENT:     Head: Normocephalic and atraumatic.  Eyes:     Pupils: Pupils are equal, round, and reactive to light.  Cardiovascular:     Rate  and Rhythm: Normal rate and regular rhythm.     Heart sounds: No murmur heard.    No friction rub. No gallop.  Pulmonary:     Effort: Pulmonary effort is normal.     Breath sounds: No wheezing or rales.  Chest:     Chest wall: Tenderness present.     Comments: Palpation of the anterior chest wall reproduces the patient's symptoms. Abdominal:     General: There is no distension.     Palpations: Abdomen is soft.     Tenderness: There is no abdominal tenderness.  Musculoskeletal:        General: No tenderness.     Cervical back: Normal range of motion and neck supple.  Skin:    General: Skin is warm and dry.  Neurological:     Mental Status: She is alert and oriented to person, place, and time.  Psychiatric:        Behavior: Behavior normal.     ED Results / Procedures / Treatments   Labs (all labs ordered are listed, but only abnormal results are displayed) Labs Reviewed  BASIC METABOLIC PANEL - Abnormal; Notable for the following components:      Result Value   Sodium 132 (*)    Glucose, Bld 113 (*)  All other components within normal limits  CBC  HCG, SERUM, QUALITATIVE  TROPONIN I (HIGH SENSITIVITY)  TROPONIN I (HIGH SENSITIVITY)    EKG EKG Interpretation Date/Time:  Sunday May 11 2023 19:03:13 EDT Ventricular Rate:  91 PR Interval:  148 QRS Duration:  76 QT Interval:  340 QTC Calculation: 419 R Axis:   72  Text Interpretation: Sinus rhythm Borderline T abnormalities, inferior leads Since last tracing rate slower Otherwise no significant change Confirmed by Melene Plan [phone]) on 05/11/2023 7:38:32 PM  Radiology DG Chest 2 View Result Date: 05/11/2023 CLINICAL DATA:  Chest pain EXAM: CHEST - 2 VIEW COMPARISON:  Chest x-ray 04/12/2022 FINDINGS: The heart size and mediastinal contours are within normal limits. Both lungs are clear. The visualized skeletal structures are unremarkable. IMPRESSION: No active cardiopulmonary disease. Electronically Signed   By: Darliss Cheney M.D.   On: 05/11/2023 19:24    Procedures Procedures    Medications Ordered in ED Medications - No data to display  ED Course/ Medical Decision Making/ A&P                                 Medical Decision Making Amount and/or Complexity of Data Reviewed Labs: ordered. Radiology: ordered.   28 yo F with a chief complaints of chest pain.  Atypical in nature reproduced on exam going on since yesterday.  Troponin negative, no anemia, no significant electrolyte abnormalities.  Chest x-ray independently interpreted by me without focal infiltrate or pneumothorax.  EKG is nonischemic.  She is PERC negative.  Will treat as musculoskeletal.  PCP follow-up.  8:54 PM:  I have discussed the diagnosis/risks/treatment options with the patient.  Evaluation and diagnostic testing in the emergency department does not suggest an emergent condition requiring admission or immediate intervention beyond what has been performed at this time.  They will follow up with PCP. We also discussed returning to the ED immediately if new or worsening sx occur. We discussed the sx which are most concerning (e.g., sudden worsening pain, fever, inability to tolerate by mouth) that necessitate immediate return. Medications administered to the patient during their visit and any new prescriptions provided to the patient are listed below.  Medications given during this visit Medications - No data to display   The patient appears reasonably screen and/or stabilized for discharge and I doubt any other medical condition or other [facility] requiring further screening, evaluation, or treatment in the ED at this time prior to discharge.          Final Clinical Impression(s) / ED Diagnoses Final diagnoses:  Nonspecific chest pain    Rx / DC Orders ED Discharge Orders     None         Melene Plan, DO 05/11/23 2054

## 2023-05-17 ENCOUNTER — Other Ambulatory Visit: Payer: Self-pay

## 2023-05-17 ENCOUNTER — Encounter (HOSPITAL_COMMUNITY): Payer: Self-pay

## 2023-05-17 ENCOUNTER — Emergency Department (HOSPITAL_COMMUNITY)
Admission: EM | Admit: 2023-05-17 | Discharge: 2023-05-17 | Disposition: A | Discharge status: Home / Self Care | Attending: Emergency Medicine | Admitting: Emergency Medicine

## 2023-05-17 ENCOUNTER — Ambulatory Visit (HOSPITAL_BASED_OUTPATIENT_CLINIC_OR_DEPARTMENT_OTHER)
Admission: RE | Admit: 2023-05-17 | Discharge: 2023-05-17 | Disposition: A | Discharge status: Home / Self Care | Source: Ambulatory Visit | Attending: Emergency Medicine | Admitting: Emergency Medicine

## 2023-05-17 ENCOUNTER — Emergency Department (HOSPITAL_COMMUNITY)

## 2023-05-17 ENCOUNTER — Other Ambulatory Visit (HOSPITAL_BASED_OUTPATIENT_CLINIC_OR_DEPARTMENT_OTHER): Payer: Self-pay

## 2023-05-17 DIAGNOSIS — M79604 Pain in right leg: Secondary | ICD-10-CM

## 2023-05-17 DIAGNOSIS — M79605 Pain in left leg: Secondary | ICD-10-CM | POA: Insufficient documentation

## 2023-05-17 LAB — HCG, SERUM, QUALITATIVE: Preg, Serum: NEGATIVE

## 2023-05-17 MED ORDER — ACETAMINOPHEN 500 MG PO TABS
1000.0000 mg | ORAL_TABLET | Freq: Once | ORAL | Status: AC
  Administered 2023-05-17: 1000 mg via ORAL
  Filled 2023-05-17: qty 2

## 2023-05-17 MED ORDER — LIDOCAINE 5 % EX PTCH
1.0000 | MEDICATED_PATCH | CUTANEOUS | 0 refills | Status: DC
  Filled 2023-05-17 (×2): qty 30, 30d supply, fill #0

## 2023-05-17 MED ORDER — NAPROXEN 500 MG PO TABS
500.0000 mg | ORAL_TABLET | Freq: Once | ORAL | Status: AC
  Administered 2023-05-17: 500 mg via ORAL
  Filled 2023-05-17: qty 1

## 2023-05-17 NOTE — Progress Notes (Signed)
 Bilateral lower extremity venous duplex has been completed. Preliminary results can be found in CV Proc through chart review.   05/17/23 11:05 AM Olen Cordial RVT

## 2023-05-17 NOTE — ED Triage Notes (Signed)
 Pt states that she has been having bil leg pain  accompanied by heavy, center, chest pressure and tightness x5 days.Pt also endorses lightheadedness, dizziness, sob, numbness and tingling bilaterally in hands. Recently seen on Sunday for the same but states that the symptoms has gotten worse. Denies n/v.

## 2023-05-17 NOTE — ED Notes (Signed)
 Pt came to this tech and stated her leg pain is getting worse

## 2023-05-17 NOTE — ED Provider Notes (Signed)
 [place] EMERGENCY DEPARTMENT AT [facility] Provider Note   CSN: [id number] Arrival date & time: 05/17/23  0025     History  Chief Complaint  Patient presents with   Leg Pain    Kristina Blair is a 28 y.o. female.  The history is provided by the patient.  Leg Pain Lower extremity pain location: BLE pain. Injury: no   Pain details:    Quality:  Aching   Radiates to:  Does not radiate   Severity:  Moderate   Onset quality:  Gradual   Duration:  6 days   Timing:  Constant   Progression:  Unchanged Chronicity:  New Foreign body present:  No foreign bodies Tetanus status:  Out of date Relieved by:  Nothing Worsened by:  Nothing Ineffective treatments:  None tried Associated symptoms: no back pain and no fever   Risk factors: no concern for non-accidental trauma        Home Medications Prior to Admission medications   Medication Sig Start Date End Date Taking? Authorizing Provider  levonorgestrel (KYLEENA) 19.5 MG IUD 1 each by Intrauterine route once.    [provider]  Multiple Vitamins-Calcium (ONE-A-DAY WOMENS FORMULA) TABS Take 1 tablet by mouth daily with breakfast.    [provider]      Allergies    Kiwi extract and Reglan [metoclopramide]    Review of Systems   Review of Systems  Constitutional:  Negative for fever.  HENT:  Negative for facial swelling.   Respiratory:  Negative for wheezing and stridor.   Cardiovascular:  Negative for chest pain and leg swelling.  Musculoskeletal:  Negative for back pain.  All other systems reviewed and are negative.   Physical Exam Updated Vital Signs BP 118/82   Pulse 80   Temp 98.2 F (36.8 C) (Oral)   Resp 17   Ht 5\' 1"  (1.549 m)   Wt 70.3 kg   SpO2 100%   BMI 29.29 kg/m  Physical Exam Vitals and nursing note reviewed.  Constitutional:      General: She is not in acute distress.    Appearance: She is well-developed.  HENT:     Head: Normocephalic and atraumatic.      Nose: Nose normal.  Eyes:     Pupils: Pupils are equal, round, and reactive to light.  Cardiovascular:     Rate and Rhythm: Normal rate and regular rhythm.     Pulses: Normal pulses.     Heart sounds: Normal heart sounds.  Pulmonary:     Effort: Pulmonary effort is normal. No respiratory distress.     Breath sounds: Normal breath sounds.  Abdominal:     General: Bowel sounds are normal. There is no distension.     Palpations: Abdomen is soft.     Tenderness: There is no abdominal tenderness. There is no guarding or rebound.  Musculoskeletal:        General: No tenderness. Normal range of motion.     Cervical back: Neck supple.     Right lower leg: No edema.     Left lower leg: No edema.     Comments: No cords negative Homan's sign   Skin:    General: Skin is dry.     Capillary Refill: Capillary refill takes less than 2 seconds.     Findings: No erythema or rash.  Neurological:     General: No focal deficit present.     Deep Tendon Reflexes: Reflexes normal.  Psychiatric:  Mood and Affect: Mood normal.     ED Results / Procedures / Treatments   Labs (all labs ordered are listed, but only abnormal results are displayed) Labs Reviewed  HCG, SERUM, QUALITATIVE    EKG None  Radiology DG Chest Portable 1 View Result Date: 05/17/2023 CLINICAL DATA:  Chest pressure, bilateral leg pain EXAM: PORTABLE CHEST 1 VIEW COMPARISON:  05/11/2023 FINDINGS: The heart size and mediastinal contours are within normal limits. Both lungs are clear. The visualized skeletal structures are unremarkable. IMPRESSION: Normal study. Electronically Signed   By: Charlett Nose M.D.   On: 05/17/2023 01:02    Procedures Procedures    Medications Ordered in ED Medications  naproxen (NAPROSYN) tablet 500 mg (has no administration in time range)  acetaminophen (TYLENOL) tablet 1,000 mg (has no administration in time range)    ED Course/ Medical Decision Making/ A&P                                  Medical Decision Making Patient with BLE pain x 6 days   Amount and/or Complexity of Data Reviewed External Data Reviewed: notes.    Details: Previous notes reviewed  Labs: ordered. Radiology: ordered.  Risk OTC drugs. Prescription drug management. Risk Details: Set up for outpatient DVT study. Will start lidoderm.  Stable for discharge.  Strict returns.      Final Clinical Impression(s) / ED Diagnoses Final diagnoses:  None    No signs of systemic illness or infection. The patient is nontoxic-appearing on exam and vital signs are within normal limits.  I have reviewed the triage vital signs and the nursing notes. Pertinent labs & imaging results that were available during my care of the patient were reviewed by me and considered in my medical decision making (see chart for details). After history, exam, and medical workup I feel the patient has been appropriately medically screened and is safe for discharge home. Pertinent diagnoses were discussed with the patient. Patient was given return precautions.  Rx / DC Orders ED Discharge Orders     None         Jacole Capley, MD 05/17/23 0981

## 2023-05-17 NOTE — ED Notes (Signed)
 05/17/23 1120 pt called requesting prior authorization for prescription Lidocaine patches. Spoke with Dr Rhunette Croft who verified she can by the 4% OTC which will work. Pt updated and in agreement.

## 2023-06-10 ENCOUNTER — Telehealth: Payer: Self-pay

## 2023-06-10 ENCOUNTER — Telehealth: Payer: Self-pay | Admitting: General Practice

## 2023-06-10 NOTE — Telephone Encounter (Signed)
 Patient scheduled for a new patient appt with Jarold Motto on 07/01/2023.    Patient last seen [facility] back on 04/17/2022.  Per Jarold Motto she is unable to take patient on at this time.  She also declined TOC on 10/07/2022 and on 04/17/22..  I have inform patient of this information today via phone conversation.  Patient states she no longer wished to have Dulce Sellar as her PCP.  Patient also disclosed she had been seen by Dr. De Peru at [facility]  States she no longer wanted to see De Peru because she wanted a female provider.  Patient was upset she could not Transfer Care to [facility].  I tried to offer other suggestions .  Patient was escalating with dismay.  I had to disconnect the call.

## 2023-06-10 NOTE — Telephone Encounter (Signed)
 Patient called into [facility] and Bonita Quin answered the phone call. Per Bonita Quin, the E[employer] agent patient was crying due to not being able to be seen by Jarold Motto. Due to management not being available at that time, I took the prepared to take the phone call to explain this to the patient. Unfortunately when [employer] agent was trying to connect me with the patient, the patient was unresponsive. I informed [employer] agent that I would call back, which I did but received no answer. I LVM informing pt that she will get call back from management.

## 2023-06-10 NOTE — Telephone Encounter (Signed)
 Error

## 2023-06-18 NOTE — Telephone Encounter (Signed)
 Left VM for patient to call back.

## 2023-06-30 ENCOUNTER — Telehealth (HOSPITAL_BASED_OUTPATIENT_CLINIC_OR_DEPARTMENT_OTHER): Payer: Self-pay | Admitting: *Deleted

## 2023-06-30 ENCOUNTER — Ambulatory Visit (HOSPITAL_BASED_OUTPATIENT_CLINIC_OR_DEPARTMENT_OTHER): Admitting: Family Medicine

## 2023-06-30 NOTE — Telephone Encounter (Signed)
 Copied from CRM [id number]. Topic: Referral - Request for Referral >> Jun 30, 2023  1:26 PM Opal Bill wrote: Did the patient discuss referral with their provider in the last year? No (If No - schedule appointment) (If Yes - send message)  Appointment offered? Yes  Type of order/referral and detailed reason for visit: Psychiatrist, Chiropractor, and Dietician.  Preference of office, provider, location: No preference just yet, but will have the information at time of appt.  If referral order, have you been seen by this specialty before? No (If Yes, this issue or another issue? When? Where?  Can we respond through MyChart? Yes

## 2023-06-30 NOTE — Telephone Encounter (Signed)
 No further action needed.

## 2023-06-30 NOTE — Telephone Encounter (Signed)
 Copied from CRM [id number]. Topic: Appointments - Transfer of Care >> Jun 30, 2023  2:36 PM Adrionna Y wrote: Pt is requesting to transfer FROM: Kristina Blair Pt is requesting to transfer TO: [name] Reason for requested transfer: relocated It is the responsibility of the team the patient would like to transfer to (Dr. [name]) to reach out to the patient if for any reason this transfer is not acceptable.

## 2023-06-30 NOTE — Telephone Encounter (Signed)
 This is not our patient.

## 2023-07-01 ENCOUNTER — Ambulatory Visit: Payer: Self-pay | Admitting: Physician Assistant

## 2023-07-08 ENCOUNTER — Ambulatory Visit (INDEPENDENT_AMBULATORY_CARE_PROVIDER_SITE_OTHER): Payer: Self-pay | Admitting: Podiatry

## 2023-07-08 ENCOUNTER — Other Ambulatory Visit (HOSPITAL_BASED_OUTPATIENT_CLINIC_OR_DEPARTMENT_OTHER): Payer: Self-pay

## 2023-07-08 DIAGNOSIS — L600 Ingrowing nail: Secondary | ICD-10-CM | POA: Diagnosis not present

## 2023-07-08 MED ORDER — NEOMYCIN-POLYMYXIN-HC 1 % OT SOLN
OTIC | 1 refills | Status: DC
  Filled 2023-07-08: qty 10, 50d supply, fill #0

## 2023-07-08 NOTE — Patient Instructions (Signed)

## 2023-07-09 ENCOUNTER — Encounter: Payer: Self-pay | Admitting: Podiatry

## 2023-07-09 ENCOUNTER — Other Ambulatory Visit (HOSPITAL_BASED_OUTPATIENT_CLINIC_OR_DEPARTMENT_OTHER): Payer: Self-pay

## 2023-07-09 NOTE — Progress Notes (Signed)
  Subjective:  Patient ID: Kristina Blair, female    DOB: [birth year],  MRN: [id number] HPI Chief Complaint  Patient presents with   Ingrown Toenail    Pt presents for ingrown toenail of left great toe.    28 y.o. female presents with the above complaint.   ROS: Denies fever chills nausea vomiting muscle aches pains calf pain back pain chest pain shortness of breath.  Past Medical History:  Diagnosis Date   Abnormal thyroid  function test 01/17/2021   Abnormal weight loss 06/18/2021   Allergy    Anxiety    ASCUS of cervix with negative high risk HPV    Asthma    Phreesia 02/13/2020   Generalized anxiety disorder 06/28/2021   GERD (gastroesophageal reflux disease)    Phreesia 02/13/2020   Hypoglycemia 01/17/2021   IUD (intrauterine device) in place 09/2019   liletta   Malnutrition of moderate degree 06/11/2021   Near syncope 05/30/2021   Paroxysmal SVT (supraventricular tachycardia) (HCC)    Past Surgical History:  Procedure Laterality Date   ESOPHAGOGASTRODUODENOSCOPY (EGD) WITH PROPOFOL  N/A 06/14/2021   Procedure: ESOPHAGOGASTRODUODENOSCOPY (EGD) WITH PROPOFOL ;  Surgeon: Evangeline Hilts, MD;  Location: [facility] ENDOSCOPY;  Service: Gastroenterology;  Laterality: N/A;   WISDOM TOOTH EXTRACTION      Current Outpatient Medications:    NEOMYCIN-POLYMYXIN-HYDROCORTISONE (CORTISPORIN) 1 % SOLN OTIC solution, Apply 1-2 drops to toe twice daily after soaking., Disp: 10 mL, Rfl: 1   levonorgestrel (KYLEENA) 19.5 MG IUD, 1 each by Intrauterine route once., Disp: , Rfl:    Multiple Vitamins-Calcium (ONE-A-DAY WOMENS FORMULA) TABS, Take 1 tablet by mouth daily with breakfast., Disp: , Rfl:   Allergies  Allergen Reactions   Kiwi Extract Itching and Other (See Comments)    Tongue itching   Reglan  [Metoclopramide ] Anxiety   Review of Systems Objective:  There were no vitals filed for this visit.  General: Well developed, nourished, in no acute distress, alert and oriented x3    Dermatological: Skin is warm, dry and supple bilateral. Nails x 10 are well maintained; remaining integument appears unremarkable at this time. There are no open sores, no preulcerative lesions, no rash or signs of infection present.  Left hallux fibular border red erythematous tender to the touch Sharp incurvated nail margin.  Vascular: Dorsalis Pedis artery and Posterior Tibial artery pedal pulses are 2/4 bilateral with immedate capillary fill time. Pedal hair growth present. No varicosities and no lower extremity edema present bilateral.   Neruologic: Grossly intact via light touch bilateral. Vibratory intact via tuning fork bilateral. Protective threshold with Semmes Wienstein monofilament intact to all pedal sites bilateral. Patellar and Achilles deep tendon reflexes 2+ bilateral. No Babinski or clonus noted bilateral.   Musculoskeletal: No gross boney pedal deformities bilateral. No pain, crepitus, or limitation noted with foot and ankle range of motion bilateral. Muscular strength 5/5 in all groups tested bilateral.  Gait: Unassisted, Nonantalgic.    Radiographs:  None taken  Assessment & Plan:   Assessment: Ingrown toenail hallux left fibular  Plan: Chemical matricectomy was performed today after local anesthetic was administered she tolerated the procedure well without complications.  Was given both oral and written home-going instructions for the care and soaking of the toe as well as a prescription for Cortisporin Otic to be applied twice daily after soaking.  She will follow-up with me in about 2 weeks     Rowland Ericsson T. [place], [place]

## 2023-07-15 ENCOUNTER — Encounter (INDEPENDENT_AMBULATORY_CARE_PROVIDER_SITE_OTHER): Payer: Self-pay

## 2023-07-15 ENCOUNTER — Encounter: Payer: Self-pay | Admitting: Family Medicine

## 2023-07-15 ENCOUNTER — Ambulatory Visit: Payer: Self-pay | Admitting: Family Medicine

## 2023-07-15 ENCOUNTER — Ambulatory Visit (INDEPENDENT_AMBULATORY_CARE_PROVIDER_SITE_OTHER): Admitting: Family Medicine

## 2023-07-15 VITALS — BP 118/70 | HR 98 | Resp 16 | Ht 61.0 in | Wt 161.2 lb

## 2023-07-15 DIAGNOSIS — M549 Dorsalgia, unspecified: Secondary | ICD-10-CM

## 2023-07-15 DIAGNOSIS — G8929 Other chronic pain: Secondary | ICD-10-CM | POA: Insufficient documentation

## 2023-07-15 DIAGNOSIS — F419 Anxiety disorder, unspecified: Secondary | ICD-10-CM

## 2023-07-15 DIAGNOSIS — E669 Obesity, unspecified: Secondary | ICD-10-CM | POA: Insufficient documentation

## 2023-07-15 DIAGNOSIS — F330 Major depressive disorder, recurrent, mild: Secondary | ICD-10-CM | POA: Diagnosis not present

## 2023-07-15 DIAGNOSIS — E785 Hyperlipidemia, unspecified: Secondary | ICD-10-CM | POA: Diagnosis not present

## 2023-07-15 DIAGNOSIS — L74510 Primary focal hyperhidrosis, axilla: Secondary | ICD-10-CM | POA: Insufficient documentation

## 2023-07-15 DIAGNOSIS — R7303 Prediabetes: Secondary | ICD-10-CM | POA: Diagnosis not present

## 2023-07-15 DIAGNOSIS — E559 Vitamin D deficiency, unspecified: Secondary | ICD-10-CM | POA: Diagnosis not present

## 2023-07-15 HISTORY — DX: Hyperlipidemia, unspecified: E78.5

## 2023-07-15 LAB — BASIC METABOLIC PANEL WITH GFR
BUN: 7 mg/dL (ref 6–23)
CO2: 24 meq/L (ref 19–32)
Calcium: 9.6 mg/dL (ref 8.4–10.5)
Chloride: 104 meq/L (ref 96–112)
Creatinine, Ser: 0.85 mg/dL (ref 0.40–1.20)
GFR: 94.27 mL/min (ref >60.00)
Glucose, Bld: 72 mg/dL (ref 70–99)
Potassium: 3.8 meq/L (ref 3.5–5.1)
Sodium: 137 meq/L (ref 135–145)

## 2023-07-15 LAB — LIPID PANEL
Cholesterol: 173 mg/dL (ref 0–200)
HDL: 56.2 mg/dL (ref >39.00)
LDL Cholesterol: 99 mg/dL (ref 0–99)
NonHDL: 116.87
Total CHOL/HDL Ratio: 3
Triglycerides: 90 mg/dL (ref 0.0–149.0)
VLDL: 18 mg/dL (ref 0.0–40.0)

## 2023-07-15 LAB — HEMOGLOBIN A1C: Hgb A1c MFr Bld: 5.1 % (ref 4.6–6.5)

## 2023-07-15 LAB — VITAMIN D 25 HYDROXY (VIT D DEFICIENCY, FRACTURES): VITD: 10.64 ng/mL — ABNORMAL LOW (ref 30.00–100.00)

## 2023-07-15 MED ORDER — VITAMIN D (ERGOCALCIFEROL) 1.25 MG (50000 UNIT) PO CAPS: 50000.0000 [IU] | ORAL_CAPSULE | ORAL | 0 refills | Status: AC

## 2023-07-15 NOTE — Assessment & Plan Note (Signed)
Currently she is not on vitamin D supplementation. Further recommendation will be given according to 25 OH vitamin D result. 

## 2023-07-15 NOTE — Patient Instructions (Signed)
 A few things to remember from today's visit:  Hyperlipidemia, unspecified hyperlipidemia type - Plan: Lipid panel  Prediabetes - Plan: Basic metabolic panel with GFR, Hemoglobin A1c  Depression, major, recurrent, mild (HCC) - Plan: Ambulatory referral to Psychiatry  Anxiety disorder, unspecified type - Plan: Ambulatory referral to Psychiatry  Upper back pain, chronic - Plan: Ambulatory referral to Chiropractic  Hyperhidrosis of axilla - Plan: Ambulatory referral to Dermatology  Vitamin D deficiency, unspecified - Plan: VITAMIN D 25 Hydroxy (Vit-D Deficiency, Fractures), Amb Ref to Medical Weight Management  Obesity with body mass index (BMI) of 30.0 to 39.9  Do not use My Chart to request refills or for acute issues that need immediate attention. If you send a my chart message, it may take a few days to be addressed, specially if I am not in the office.  Please be sure medication list is accurate. If a new problem present, please set up appointment sooner than planned today.

## 2023-07-15 NOTE — Progress Notes (Signed)
 HPI: Kristina Blair is a 28 y.o. female with a PMHx significant for vitamin D deficiency, hypokalemia, Ehlers-Danlos syndrome, HLD, prediabetes, PCOS, ADD, and anxiety and depression, among some, who is here today to establish care.  Former PCPs: Versa Gore, NP and Court Distance [place], MD.  Last preventive routine visit: more than a year ago  Exercise: She walks for about an hour daily and also goes to the gym occasionally.  Diet: She eats meals cooked at home by her grandmother. She eats vegetables.  Sleep: She sleeps 11-12 hours per night.  Alcohol Use: none Smoking: never Vision: Not established with an eye doctor at this time.  Dental: UTD on routine dental care.   She follows regularly with gynecology. She has an IUD.   Chronic medical problems:   Prediabetes: Dx'ed a few years ago. Lab Results  Component Value Date   HGBA1C 5.2 01/17/2021   Hyperlipidemia: Not currently on pharmacologic treatment.  Lab Results  Component Value Date   CHOL 188 05/14/2021   HDL 64 05/14/2021   LDLCALC 111 (H) 05/14/2021   TRIG 69 05/14/2021   CHOLHDL 2.9 05/14/2021   Vitamin D deficiency:  Not currently taking vitamin D supplementation.   Anxiety and Depression: dx'ed at 21 She had been seeing a psychiatrist in the past but had to stop for insurance reasons. Last visit with psychiatrist in 07/2022. She had been prescribed Pristiq  but lost her insurance shortly after starting. Not taking anything at this time.  FMHx of bipolar disorder on her father's side of the family and anxiety and depression in her mother.  She would like a referral to a new psychiatrist.      07/15/2023   10:25 AM 02/14/2023   11:28 AM 12/03/2022    2:48 PM 04/17/2022    9:43 AM 10/17/2021   11:49 AM  Depression screen PHQ 2/9  Decreased Interest 1 0 0 1 0  Down, Depressed, Hopeless 1 0 0 1 2  PHQ - 2 Score 2 0 0 2 2  Altered sleeping 1 0  1 0  Tired, decreased energy 3 0  1 0  Change in appetite 2 0   1 0  Feeling bad or failure about yourself  2 0  1 1  Trouble concentrating 0 0  1 1  Moving slowly or fidgety/restless 0 0  1 0  Suicidal thoughts 0 0  1 0  PHQ-9 Score 10 0  9 4  Difficult doing work/chores Somewhat difficult Not difficult at all  Very difficult Somewhat difficult      07/15/2023   10:25 AM 02/14/2023   11:28 AM 04/17/2022    9:44 AM 06/28/2021   10:05 AM  GAD 7 : Generalized Anxiety Score  Nervous, Anxious, on Edge 1 0 3 3  Control/stop worrying 1 0 3 3  Worry too much - different things 1 0 3 3  Trouble relaxing 0 0 3 3  Restless 0 0 3 3  Easily annoyed or irritable 1 0 3 3  Afraid - awful might happen 0 0 3 3  Total GAD 7 Score 4 0 21 21  Anxiety Difficulty Somewhat difficult Not difficult at all Very difficult Extremely difficult   She would also like a referral to dermatology for moderate to severe sweating in her armpits. She has tried Certain Dri and Carpe for the sweating.  Denies any rashes.   She further requests a referral to a chiropractor for upper back pain related to sleeping  positions. This is a chronic problem and chiropractic treatment has helped. She would like to see [facility]. She rates the pain as a 5/10 at its worst, it is not radiated and not associated numbness/tingling of UE's   -Patient also complains of recent weight gain.  She is interested in seeing the weight loss clinic.   Review of Systems  Constitutional:  Negative for activity change, appetite change and fever.  HENT:  Negative for mouth sores, nosebleeds and sore throat.   Respiratory:  Negative for cough, shortness of breath and wheezing.   Cardiovascular:  Negative for chest pain and leg swelling.  Gastrointestinal:  Negative for abdominal pain, nausea and vomiting.  Endocrine: Negative for cold intolerance, heat intolerance, polydipsia, polyphagia and polyuria.  Genitourinary:  Negative for decreased urine volume and hematuria.  Musculoskeletal:  Positive for myalgias.  Negative for arthralgias.  Neurological:  Negative for syncope, weakness and headaches.  Psychiatric/Behavioral:  Negative for confusion and hallucinations.   See other pertinent positives and negatives in HPI.  Current Outpatient Medications on File Prior to Visit  Medication Sig Dispense Refill   levonorgestrel (KYLEENA) 19.5 MG IUD 1 each by Intrauterine route once.     Multiple Vitamins-Calcium (ONE-A-DAY WOMENS FORMULA) TABS Take 1 tablet by mouth daily with breakfast.     NEOMYCIN -POLYMYXIN-HYDROCORTISONE (CORTISPORIN ) 1 % SOLN OTIC solution Apply 1-2 drops to toe twice daily after soaking. 10 mL 1   No current facility-administered medications on file prior to visit.   Past Medical History:  Diagnosis Date   Abnormal thyroid  function test 01/17/2021   Abnormal weight loss 06/18/2021   Allergy    Anxiety    ASCUS of cervix with negative high risk HPV    Asthma    Phreesia 02/13/2020   Generalized anxiety disorder 06/28/2021   GERD (gastroesophageal reflux disease)    Phreesia 02/13/2020   Hypoglycemia 01/17/2021   IUD (intrauterine device) in place 09/2019   liletta   Malnutrition of moderate degree 06/11/2021   Near syncope 05/30/2021   Paroxysmal SVT (supraventricular tachycardia) (HCC)    Allergies  Allergen Reactions   Kiwi Extract Itching and Other (See Comments)    Tongue itching   Reglan  [Metoclopramide ] Anxiety   Family History  Problem Relation Age of Onset   Diabetes Mother    Hypertension Mother    Breast cancer Mother 13       recurrence age 49, reports BRCA1+   Early death Mother    Hyperlipidemia Mother    Miscarriages / Stillbirths Mother    Obesity Mother    Alcohol abuse Mother    Cancer Mother    Bipolar disorder Father    Cerebral palsy Brother    Epilepsy Brother    ADD / ADHD Brother    Cancer Maternal Aunt 30       died at 28, maternal great aunt   Asthma Maternal Aunt    Diabetes Maternal Grandmother    Heart disease Maternal  Grandmother    Hyperlipidemia Maternal Grandmother    Hypertension Maternal Grandmother    Arthritis Maternal Grandmother    COPD Maternal Grandmother    Hearing loss Maternal Grandmother    Leukemia Maternal Grandfather        dx. childhood   Early death Maternal Grandfather    Colon cancer Maternal Grandfather 32   Prostate cancer Paternal Grandfather        metastatic   Ovarian cancer Maternal Great-grandmother 14   Esophageal cancer Neg Hx  Pancreatic cancer Neg Hx    Rectal cancer Neg Hx    Stomach cancer Neg Hx    Liver cancer Neg Hx    Social History   Socioeconomic History   Marital status: Single    Spouse name: Not on file   Number of children: Not on file   Years of education: Not on file   Highest education level: Some college, no degree  Occupational History   Not on file  Tobacco Use   Smoking status: Never   Smokeless tobacco: Never  Vaping Use   Vaping status: Never Used  Substance and Sexual Activity   Alcohol use: Not Currently   Drug use: Never   Sexual activity: Not Currently    Birth control/protection: I.U.D.  Other Topics Concern   Not on file  Social History Narrative   Not on file   Social Drivers of Health   Financial Resource Strain: Medium Risk (02/19/2023)   Overall Financial Resource Strain (CARDIA)    Difficulty of Paying Living Expenses: Somewhat hard  Food Insecurity: Food Insecurity Present (02/19/2023)   Hunger Vital Sign    Worried About Running Out of Food in the Last Year: Sometimes true    Ran Out of Food in the Last Year: Sometimes true  Transportation Needs: Unmet Transportation Needs (02/19/2023)   PRAPARE - Administrator, Civil Service (Medical): Yes    Lack of Transportation (Non-Medical): Yes  Physical Activity: Unknown (02/19/2023)   Exercise Vital Sign    Days of Exercise per Week: 0 days    Minutes of Exercise per Session: Not on file  Stress: No Stress Concern Present (02/19/2023)   [employer] of Occupational Health - Occupational Stress Questionnaire    Feeling of Stress : Not at all  Social Connections: Socially Isolated (02/19/2023)   Social Connection and Isolation Panel [NHANES]    Frequency of Communication with Friends and Family: More than three times a week    Frequency of Social Gatherings with Friends and Family: Once a week    Attends Religious Services: Never    Database administrator or Organizations: No    Attends Banker Meetings: Not on file    Marital Status: Never married   Vitals:   07/15/23 1014  BP: 118/70  Pulse: 98  Resp: 16  SpO2: 99%   Body mass index is 30.47 kg/m.  Physical Exam Vitals and nursing note reviewed.  Constitutional:      General: She is not in acute distress.    Appearance: She is well-developed.  HENT:     Head: Normocephalic and atraumatic.     Mouth/Throat:     Mouth: Mucous membranes are moist.     Pharynx: Oropharynx is clear.  Eyes:     Conjunctiva/sclera: Conjunctivae normal.  Cardiovascular:     Rate and Rhythm: Normal rate and regular rhythm.     Pulses:          Dorsalis pedis pulses are 2+ on the right side and 2+ on the left side.     Heart sounds: No murmur heard. Pulmonary:     Effort: Pulmonary effort is normal. No respiratory distress.     Breath sounds: Normal breath sounds.  Abdominal:     Palpations: Abdomen is soft. There is no hepatomegaly or mass.     Tenderness: There is no abdominal tenderness.  Musculoskeletal:     Right lower leg: No edema.     Left lower  leg: No edema.  Lymphadenopathy:     Cervical: No cervical adenopathy.  Skin:    General: Skin is warm.     Findings: No erythema or rash.  Neurological:     General: No focal deficit present.     Mental Status: She is alert and oriented to person, place, and time.     Cranial Nerves: No cranial nerve deficit.     Gait: Gait normal.  Psychiatric:        Mood and Affect: Mood and affect normal.     ASSESSMENT AND PLAN:  Kristina Blair was seen today to establish care.  Lab Results  Component Value Date   HGBA1C 5.1 07/15/2023   Lab Results  Component Value Date   NA 137 07/15/2023   CL 104 07/15/2023   K 3.8 07/15/2023   CO2 24 07/15/2023   BUN 7 07/15/2023   CREATININE 0.85 07/15/2023   GFR 94.27 07/15/2023   CALCIUM 9.6 07/15/2023   PHOS 3.4 06/04/2021   ALBUMIN 4.8 09/28/2022   GLUCOSE 72 07/15/2023   Lab Results  Component Value Date   CHOL 173 07/15/2023   HDL 56.20 07/15/2023   LDLCALC 99 07/15/2023   TRIG 90.0 07/15/2023   CHOLHDL 3 07/15/2023   Lab Results  Component Value Date   VD25OH 10.64 (L) 07/15/2023   Upper back pain, chronic Assessment & Plan: Chronic. Has been treated by chiropractor in the past and it helped, so she would like a referral to establish with new provider.  Orders: -     Ambulatory referral to Chiropractic  Hyperlipidemia, unspecified hyperlipidemia type Assessment & Plan: Mild, reported by patient. She would like to have lipid panel done today. Further recommendation will be given according to lab results. Continue low-fat diet.  Orders: -     Lipid panel; Future  Prediabetes Assessment & Plan: Consistency with a healthy lifestyle encouraged for diabetes prevention. Further recommendation will be given according to hemoglobin A1c result.  Orders: -     Basic metabolic panel with GFR; Future -     Hemoglobin A1c; Future  Depression, major, recurrent, mild (HCC) Assessment & Plan: Currently on non pharmacologic treatment. Last visit with psychiatrist in 07/2022. Pristiq  was recommended after genetic testing, she took med for a few days, did not follow because lost health insurance. She prefers to hold on resuming pharmacologic treatment until she sees psychiatrist. Referral placed and contact information for [facility] given, so she can call and arrange appt.  Orders: -     Ambulatory referral to  Psychiatry  Anxiety disorder, unspecified type Assessment & Plan: Currently she is not on pharmacologic treatment nor CBT. Referral to psychiatrist placed as requested.  Orders: -     Ambulatory referral to Psychiatry  Hyperhidrosis of axilla Assessment & Plan: Failed treatment with OTC products. Dermatology referral placed as requested.  Orders: -     Ambulatory referral to Dermatology  Vitamin D deficiency, unspecified Assessment & Plan: Currently she is not on vitamin D supplementation. Further recommendation will be given according to 25 OH vitamin D result.  Orders: -     VITAMIN D 25 Hydroxy (Vit-D Deficiency, Fractures); Future -     Amb Ref to Medical Weight Management  Obesity with body mass index (BMI) of 30.0 to 39.9 Assessment & Plan: She understands the benefits of wt loss as well as adverse effects of obesity. Consistency with healthy diet and physical activity encouraged. She would like referral to Healthy wt  and wellness clinic.   I spent a total of 43 minutes in both face to face and non face to face activities for this visit on the date of this encounter. During this time history was obtained and documented, examination was performed, prior labs reviewed, and assessment/plan discussed.  Return in about 1 year (around 07/14/2024) for CPE.  I, Fritz Jewel Wierda, acting as a scribe for Stephenie Navejas [place], MD., have documented all relevant documentation on the behalf of Tye Vigo [place], MD, as directed by  Bao Bazen [place], MD while in the presence of Zikeria Keough [place], MD.   I, Iori Gigante [place], MD, have reviewed all documentation for this visit. The documentation on 07/15/23 for the exam, diagnosis, procedures, and orders are all accurate and complete.  Jayni Prescher G. [place], MD  [facility]. [place] office.

## 2023-07-15 NOTE — Assessment & Plan Note (Signed)
 Chronic. Has been treated by chiropractor in the past and it helped, so she would like a referral to establish with new provider.

## 2023-07-15 NOTE — Assessment & Plan Note (Signed)
 Currently she is not on pharmacologic treatment nor CBT. Referral to psychiatrist placed as requested.

## 2023-07-15 NOTE — Assessment & Plan Note (Signed)
Consistency with a healthy life style encouraged for diabetes prevention. ?Further recommendation will be given according to hemoglobin A1c result. ?

## 2023-07-15 NOTE — Assessment & Plan Note (Addendum)
 Currently on non pharmacologic treatment. Last visit with psychiatrist in 07/2022. Pristiq  was recommended after genetic testing, she took med for a few days, did not follow because lost health insurance. She prefers to hold on resuming pharmacologic treatment until she sees psychiatrist. Referral placed and contact information for [facility] given, so she can call and arrange appt.

## 2023-07-15 NOTE — Assessment & Plan Note (Signed)
 Failed treatment with OTC products. Dermatology referral placed as requested.

## 2023-07-15 NOTE — Assessment & Plan Note (Signed)
 She understands the benefits of wt loss as well as adverse effects of obesity. Consistency with healthy diet and physical activity encouraged. She would like referral to Healthy wt and wellness clinic.

## 2023-07-15 NOTE — Assessment & Plan Note (Signed)
 Mild, reported by patient. She would like to have lipid panel done today. Further recommendation will be given according to lab results. Continue low-fat diet.

## 2023-07-22 ENCOUNTER — Ambulatory Visit: Admitting: Podiatry

## 2023-07-31 ENCOUNTER — Ambulatory Visit: Admitting: Podiatry

## 2023-09-01 ENCOUNTER — Encounter (INDEPENDENT_AMBULATORY_CARE_PROVIDER_SITE_OTHER): Payer: Self-pay

## 2023-09-09 ENCOUNTER — Encounter (HOSPITAL_BASED_OUTPATIENT_CLINIC_OR_DEPARTMENT_OTHER): Payer: Self-pay

## 2023-09-09 ENCOUNTER — Emergency Department (HOSPITAL_BASED_OUTPATIENT_CLINIC_OR_DEPARTMENT_OTHER)
Admission: EM | Admit: 2023-09-09 | Discharge: 2023-09-09 | Disposition: A | Discharge status: Home / Self Care | Attending: Emergency Medicine | Admitting: Emergency Medicine

## 2023-09-09 ENCOUNTER — Other Ambulatory Visit: Payer: Self-pay

## 2023-09-09 DIAGNOSIS — R21 Rash and other nonspecific skin eruption: Secondary | ICD-10-CM | POA: Diagnosis not present

## 2023-09-09 DIAGNOSIS — R2230 Localized swelling, mass and lump, unspecified upper limb: Secondary | ICD-10-CM | POA: Diagnosis present

## 2023-09-09 MED ORDER — PREDNISONE 10 MG (21) PO TBPK: ORAL_TABLET | ORAL | 0 refills | Status: DC

## 2023-09-09 NOTE — ED Provider Notes (Signed)
 [place] EMERGENCY DEPARTMENT AT [facility]  Provider Note  CSN: [id number] Arrival date & time: 09/09/23 9446  History Chief Complaint  Patient presents with   Arm Swelling    Kristina Blair is a 28 y.o. female with history of pSVT, Ehlers-Danlos, GAD, reports she woke up with bumps on her arms. She has been having some upper back discomfort the last few weeks and reports not sleeping well. Recently started trazodone  to see if that would help but she states it makes her feel funny. She denies any pain or injury. No cough, fever or SOB. No new soaps, lotions, detergents, etc. Grandmother sleeps in same bed and did not have a rash this morning.    Home Medications Prior to Admission medications   Medication Sig Start Date End Date Taking? Authorizing Provider  predniSONE  (STERAPRED UNI-PAK 21 TAB) 10 MG (21) TBPK tablet 10mg  Tabs, 6 day taper. Use as directed 09/09/23  Yes Roselyn Carlin NOVAK, MD  levonorgestrel [facility]) 19.5 MG IUD 1 each by Intrauterine route once.    [provider]  Multiple Vitamins-Calcium (ONE-A-DAY WOMENS FORMULA) TABS Take 1 tablet by mouth daily with breakfast.    [provider]  NEOMYCIN -POLYMYXIN-HYDROCORTISONE (CORTISPORIN ) 1 % SOLN OTIC solution Apply 1-2 drops to toe twice daily after soaking. 07/08/23   Hyatt, Max T, DPM  Vitamin D , Ergocalciferol , (DRISDOL ) 1.25 MG (50000 UNIT) CAPS capsule Take 1 capsule (50,000 Units total) by mouth every 7 (seven) days for 12 doses. 07/15/23 10/01/23  [place], Betty G, MD     Allergies    Kiwi extract and Reglan  [metoclopramide ]   Review of Systems   Review of Systems Please see HPI for pertinent positives and negatives  Physical Exam BP (!) 154/87   Pulse (!) 108   Temp 98.3 F (36.8 C) (Oral)   Resp 16   Ht 5' 1 (1.549 m)   Wt 72.6 kg   SpO2 100%   BMI 30.23 kg/m   Physical Exam Vitals and nursing note reviewed.  HENT:     Head: Normocephalic.     Nose: Nose normal.  Eyes:      Extraocular Movements: Extraocular movements intact.  Cardiovascular:     Pulses: Normal pulses.  Pulmonary:     Effort: Pulmonary effort is normal.  Musculoskeletal:        General: Normal range of motion.     Cervical back: Neck supple.  Skin:    Findings: Rash (mild erythema/hives to BUE R>L) present.  Neurological:     Mental Status: She is alert and oriented to person, place, and time.  Psychiatric:        Mood and Affect: Mood normal.     ED Results / Procedures / Treatments   EKG None  Procedures Procedures  Medications Ordered in the ED Medications - No data to display  Initial Impression and Plan  Patient here with ?hives to her arms, airway is normal. She is NVI, no concern for acute vascular compromise. No concern for DVT. Will plan short course of steroids. Discuss alternatives to Trazodone  with prescriber. PCP follow up, RTED for any other concerns.    ED Course       MDM Rules/Calculators/A&P Medical Decision Making Problems Addressed: Rash: acute illness or injury  Risk Prescription drug management.     Final Clinical Impression(s) / ED Diagnoses Final diagnoses:  Rash    Rx / DC Orders ED Discharge Orders  Ordered    predniSONE  (STERAPRED UNI-PAK 21 TAB) 10 MG (21) TBPK tablet        09/09/23 9378             Roselyn Carlin NOVAK, MD 09/09/23 [phone]

## 2023-09-09 NOTE — ED Triage Notes (Signed)
 Pt presents via POV c/o bilateral shoulder aching and upper extremity swelling. Denies injury. Reports aching feeling in both arms.

## 2023-09-11 ENCOUNTER — Ambulatory Visit: Payer: Self-pay

## 2023-09-11 NOTE — Telephone Encounter (Signed)
 FYI Only or Action Required?: FYI only for provider.  Patient was last seen in primary care on 07/15/2023 by [place], Betty G, MD.  Called Nurse Triage reporting Rash.  Symptoms began 3 DAYS AGO.  Interventions attempted: OTC medications: BENADRYL  and Prescription medications: PREDNISONE .  Symptoms are: gradually improving.  Triage Disposition: Home Care  Patient/caregiver understands and will follow disposition?: Yes---Patient wants to keep the appointment that she had made herself on Monday to follow up with her PCP             Copied from CRM #[id number]. Topic: Clinical - Red Word Triage >> Sep 11, 2023 10:20 AM Larissa RAMAN wrote: [facility] that prompted transfer to Nurse Triage: rash on both hands and  upper lip swelling Reason for Disposition  Widespread hives    No itching unless area is touched.  The only area is on her inner thighs and looks better than it did two days ago.  Answer Assessment - Initial Assessment Questions 1. APPEARANCE: What does the rash look like?      welts 2. LOCATION: Where is the rash located?      Inner thighs---looking better per patient 3. NUMBER: How many hives are there?      varies 4. SIZE: How big are the hives? (e.g., inches, cm, compare to coins) Do they all look the same or do they vary in shape and size?      Different sizes 5. ONSET: When did the hives begin? (e.g., hours or days ago)      July 7th 6. ITCHING: Does it itch? If Yes, ask: How bad is the itch?  (e.g., none, mild, moderate, severe)     Only if touched 7. RECURRENT PROBLEM: Have you had hives before? If Yes, ask: When was the last time? and What happened that time?      Yes in the past but it was a stress rash in 2019 8. TRIGGERS: Were you exposed to any new food, plant, cosmetic product or animal just before the hives began?     Unsure exactly--- 9. OTHER SYMPTOMS: Do you have any other symptoms? (e.g., fever, tongue swelling, difficulty  breathing, abdomen pain)     No 10. PREGNANCY: Is there any chance you are pregnant? When was your last menstrual period?       No   Patient started taking 50mg  Trazadone on Saturday July 5th and reaction started on July 7th.  Patient states that she stopped taking that Trazodone  on the 7th and hasn't taken it since then. Just started first dose of Prednisone  today. Patient has been taking Benadryl  as well every 4-6 hours.    Patient states she feels a lot better and her upper lip is not swollen like it was prior to the ER visit, her arms have cleared up completely, and the welts on her inner thighs have gotten better as well.  Patient asked about possibly being referred to an allergy specialist. She denies any difficulty breathing, chest pain, abdominal pain, or pain anywhere else. She denies anything being worse since her ER visit two days and states that she has gotten better. She is advised that if anything worsens to go immediately to the Emergency Room or call 911. She verbalized understanding.  Protocols used: Hives-A-AH

## 2023-09-11 NOTE — Telephone Encounter (Signed)
 Called [facility] to inform them that patient was triaged and that the patient wanted to keep her appointment for Monday. [facility] advised that she could still keep that appointment for Monday.

## 2023-09-15 ENCOUNTER — Ambulatory Visit: Admitting: Family Medicine

## 2023-10-03 ENCOUNTER — Encounter: Payer: Self-pay | Admitting: Family Medicine

## 2023-10-03 ENCOUNTER — Ambulatory Visit (INDEPENDENT_AMBULATORY_CARE_PROVIDER_SITE_OTHER): Admitting: Family Medicine

## 2023-10-03 VITALS — BP 118/70 | HR 100 | Resp 12 | Ht 61.0 in | Wt 163.0 lb

## 2023-10-03 DIAGNOSIS — M79604 Pain in right leg: Secondary | ICD-10-CM

## 2023-10-03 DIAGNOSIS — L659 Nonscarring hair loss, unspecified: Secondary | ICD-10-CM

## 2023-10-03 DIAGNOSIS — E559 Vitamin D deficiency, unspecified: Secondary | ICD-10-CM | POA: Diagnosis not present

## 2023-10-03 MED ORDER — PREDNISONE 20 MG PO TABS: ORAL_TABLET | ORAL | 0 refills | Status: DC

## 2023-10-03 NOTE — Progress Notes (Addendum)
 Chief Complaint  Patient presents with   leg cramps    HPI: Kristina Blair is a 28 y.o. female  with PMHx significant for vitamin D  deficiency, hypokalemia, Ehlers-Danlos syndrome, HLD, prediabetes, PCOS, ADD, and anxiety and depression, among some, who is here today requesting labs to be done:Vit D,TSH,and iron.  Last seen to establish care on 07/15/2023.  Vitamin D  deficiency She has finished her Ergocalciferol  50K units once weekly for 12 weeks.  She plans to continue supplementation with OTC 2000 IUs daily as advised.  Lab Results  Component Value Date   VD25OH 10.64 (L) 07/15/2023   Hair loss: She reports increased hair loss over the last 5 months.  Has a couple bald spots on right side of head.  Has thyroid  test was done about 1 year ago and no hx of thyroid  disease.  She is interested in re-checking her thyroid  levels today.  Of note, pt has an IUD placed.   Lab Results  Component Value Date   TSH 1.520 12/03/2022   Lab Results  Component Value Date   WBC 9.4 05/11/2023   HGB 12.9 05/11/2023   HCT 41.5 05/11/2023   MCV 91.6 05/11/2023   PLT 341 05/11/2023   Calf pain / Back pain: Pt complains of right calf pain x1 week.  She reports the pain radiates from her lower back down thigh and the right calf.  + Chronic back pain, upper and lower back pain.  She also endorses occasional tingling in her toes when sitting in certain positions.  Rates her pain an 8/10.  Taking Ibuprofen  for pain relief, which has helped some.  She has also been seen by a chiropractor in the past; last seen about 1 month ago.  No hx of recent injury or unusual activity.  Denies redness, swelling, numbness.   Additionally, she reports palpitations and occipital pressure x1 week.  She has a hx of palpitations several years ago, she has been on Propranolol .  She is established with Dr. Ines of neurology for migraines/headaches.  Brain MRI 08/2021 negative for acute findings.  Review of  Systems  Constitutional:  Negative for activity change, appetite change, chills and fever.  HENT:  Negative for sore throat.   Respiratory:  Negative for cough, shortness of breath and wheezing.   Cardiovascular:  Negative for chest pain and leg swelling.  Gastrointestinal:  Negative for abdominal pain, nausea and vomiting.  Endocrine: Negative for cold intolerance and heat intolerance.  Genitourinary:  Negative for decreased urine volume, dysuria and hematuria.  Skin:  Negative for rash.  Neurological:  Negative for syncope, facial asymmetry and weakness.  See other pertinent positives and negatives in HPI.  Current Outpatient Medications on File Prior to Visit  Medication Sig Dispense Refill   levonorgestrel (KYLEENA) 19.5 MG IUD 1 each by Intrauterine route once.     Multiple Vitamins-Calcium (ONE-A-DAY WOMENS FORMULA) TABS Take 1 tablet by mouth daily with breakfast.     No current facility-administered medications on file prior to visit.   Past Medical History:  Diagnosis Date   Abnormal thyroid  function test 01/17/2021   Abnormal weight loss 06/18/2021   Allergy    Anxiety    ASCUS of cervix with negative high risk HPV    Asthma    Phreesia 02/13/2020   Generalized anxiety disorder 06/28/2021   GERD (gastroesophageal reflux disease)    Phreesia 02/13/2020   Hyperlipidemia 07/15/2023   Hypoglycemia 01/17/2021   IUD (intrauterine device) in place 09/2019  liletta   Malnutrition of moderate degree 06/11/2021   Near syncope 05/30/2021   Paroxysmal SVT (supraventricular tachycardia) (HCC)    Allergies  Allergen Reactions   Kiwi Extract Itching and Other (See Comments)    Tongue itching   Reglan  [Metoclopramide ] Anxiety    Social History   Socioeconomic History   Marital status: Single    Spouse name: Not on file   Number of children: Not on file   Years of education: Not on file   Highest education level: Associate degree: occupational, Scientist, product/process development, or vocational  program  Occupational History   Not on file  Tobacco Use   Smoking status: Never   Smokeless tobacco: Never  Vaping Use   Vaping status: Never Used  Substance and Sexual Activity   Alcohol use: Not Currently   Drug use: Never   Sexual activity: Not Currently    Birth control/protection: I.U.D.  Other Topics Concern   Not on file  Social History Narrative   Not on file   Social Drivers of Health   Financial Resource Strain: Low Risk  (09/30/2023)   Overall Financial Resource Strain (CARDIA)    Difficulty of Paying Living Expenses: Not hard at all  Food Insecurity: No Food Insecurity (09/30/2023)   Hunger Vital Sign    Worried About Running Out of Food in the Last Year: Never true    Ran Out of Food in the Last Year: Never true  Transportation Needs: No Transportation Needs (09/30/2023)   PRAPARE - Administrator, Civil Service (Medical): No    Lack of Transportation (Non-Medical): No  Physical Activity: Inactive (09/30/2023)   Exercise Vital Sign    Days of Exercise per Week: 0 days    Minutes of Exercise per Session: Not on file  Stress: Stress Concern Present (09/30/2023)   [employer] of Occupational Health - Occupational Stress Questionnaire    Feeling of Stress: Very much  Social Connections: Socially Isolated (09/30/2023)   Social Connection and Isolation Panel    Frequency of Communication with Friends and Family: Once a week    Frequency of Social Gatherings with Friends and Family: Once a week    Attends Religious Services: 1 to 4 times per year    Active Member of [employer] or Organizations: No    Attends Banker Meetings: Not on file    Marital Status: Never married   Vitals:   10/03/23 1429  BP: 118/70  Pulse: 100  Resp: 12  SpO2: 99%   Body mass index is 30.8 kg/m.  Physical Exam Vitals and nursing note reviewed.  Constitutional:      General: She is not in acute distress.    Appearance: She is well-developed.  HENT:      Head: Normocephalic and atraumatic.     Mouth/Throat:     Mouth: Mucous membranes are moist.     Pharynx: Oropharynx is clear.  Eyes:     Conjunctiva/sclera: Conjunctivae normal.  Cardiovascular:     Rate and Rhythm: Normal rate and regular rhythm.     Pulses:          Dorsalis pedis pulses are 2+ on the right side and 2+ on the left side.     Heart sounds: No murmur heard. Pulmonary:     Effort: Pulmonary effort is normal. No respiratory distress.     Breath sounds: Normal breath sounds.  Abdominal:     Palpations: Abdomen is soft. There is no mass.  Tenderness: There is no abdominal tenderness.  Musculoskeletal:     Right lower leg: No tenderness. No edema.     Left lower leg: No edema.  Lymphadenopathy:     Cervical: No cervical adenopathy.  Skin:    General: Skin is warm.     Findings: No erythema or rash.     Comments: Right temporal hair thinning.  Neurological:     General: No focal deficit present.     Mental Status: She is alert and oriented to person, place, and time.     Gait: Gait normal.  Psychiatric:        Mood and Affect: Mood and affect normal.   ASSESSMENT AND PLAN:  Ms. Daytona Hedman was seen today for a follow up.  Orders Placed This Encounter  Procedures   VITAMIN D  25 Hydroxy (Vit-D Deficiency, Fractures)   TSH   Iron   Ferritin   Basic metabolic panel with GFR   Lab Results  Component Value Date   NA 137 10/03/2023   CL 101 10/03/2023   K 4.4 10/03/2023   CO2 18 (L) 10/03/2023   BUN 10 10/03/2023   CREATININE 0.76 10/03/2023   EGFR 110 10/03/2023   CALCIUM 9.5 10/03/2023   PHOS 3.4 06/04/2021   ALBUMIN 4.8 09/28/2022   GLUCOSE 69 (L) 10/03/2023   Lab Results  Component Value Date   TSH 1.680 10/03/2023   Lab Results  Component Value Date   VD25OH 42.2 10/03/2023   Pain of right lower extremity We discussed possible causes, hx and examination doe not suggest a serious process. I do nit think imaging is needed at this  time. Peripheral pulses palpable. ? Radicular pain. I think she can hold on Prednisone  for now but recommend starting it if pain and/or tingling get worse. Monitor for new symptoms. Instructed about warning signs.  -     predniSONE ; 3 tabs for 3 days, 2 tabs for 3 days, 1 tabs for 3 days, and 1/2 tab for 3 days. Take tables together with breakfast.  Dispense: 20 tablet; Refill: 0  Vitamin D  deficiency, unspecified Assessment & Plan: Completed 12 weeks of Ergocalciferol  50,000 U. Further recommendations according to 25 OH vit D result.  Orders: -     VITAMIN D  25 Hydroxy (Vit-D Deficiency, Fractures); Future -     Basic metabolic panel with GFR; Future  Hair loss disorder Recommend trying OTC Rogaine, scalp massage with oil, and avoid tight hair styles. We can consider referral to dermatologist. Further recommendations will be given according to lab results.  -     TSH; Future -     Iron; Future -     Ferritin; Future -     Basic metabolic panel with GFR; Future  In regard to headache and palpitations, they seem to be chronic. Follow with neuro of headache gets worse. She has seen cardiologist for palpitations.  Return if symptoms worsen or fail to improve.  I, Vernell Forest, acting as a scribe for Kenwood Rosiak [place], MD., have documented all relevant documentation on the behalf of Deniya Craigo [place], MD, as directed by   while in the presence of Israa Caban [place], MD.  I, Kellyanne Ellwanger [place], MD, have reviewed all documentation for this visit. The documentation on 10/03/23 for the exam, diagnosis, procedures, and orders are all accurate and complete.  Joh Rao G. [place], MD  [facility]. [place] office.

## 2023-10-03 NOTE — Assessment & Plan Note (Signed)
 Completed 12 weeks of Ergocalciferol  50,000 U. Further recommendations according to 25 OH vit D result.

## 2023-10-03 NOTE — Patient Instructions (Signed)
 A few things to remember from today's visit:  Vitamin D  deficiency, unspecified - Plan: VITAMIN D  25 Hydroxy (Vit-D Deficiency, Fractures), Basic metabolic panel with GFR  Hair loss disorder - Plan: TSH, Iron, Ferritin, Basic metabolic panel with GFR  Pain of right lower extremity - Plan: predniSONE  (DELTASONE ) 20 MG tablet  Right leg pain can be related to your back. If worse, you can start Prednisone  with breakfast. For hair loss over the counter Rogaine may help. Massage of scalp with oil.  If you need refills for medications you take chronically, please call your pharmacy. Do not use My Chart to request refills or for acute issues that need immediate attention. If you send a my chart message, it may take a few days to be addressed, specially if I am not in the office.  Please be sure medication list is accurate. If a new problem present, please set up appointment sooner than planned today.

## 2023-10-04 ENCOUNTER — Ambulatory Visit: Payer: Self-pay | Admitting: Family Medicine

## 2023-10-04 LAB — BASIC METABOLIC PANEL WITH GFR
BUN/Creatinine Ratio: 13 (ref 9–23)
BUN: 10 mg/dL (ref 6–20)
CO2: 18 mmol/L — ABNORMAL LOW (ref 20–29)
Calcium: 9.5 mg/dL (ref 8.7–10.2)
Chloride: 101 mmol/L (ref 96–106)
Creatinine, Ser: 0.76 mg/dL (ref 0.57–1.00)
Glucose: 69 mg/dL — ABNORMAL LOW (ref 70–99)
Potassium: 4.4 mmol/L (ref 3.5–5.2)
Sodium: 137 mmol/L (ref 134–144)
eGFR: 110 mL/min/1.73 (ref >59)

## 2023-10-04 LAB — FERRITIN: Ferritin: 70 ng/mL (ref 15–150)

## 2023-10-04 LAB — TSH: TSH: 1.68 u[IU]/mL (ref 0.450–4.500)

## 2023-10-04 LAB — VITAMIN D 25 HYDROXY (VIT D DEFICIENCY, FRACTURES): Vit D, 25-Hydroxy: 42.2 ng/mL (ref 30.0–100.0)

## 2023-10-04 LAB — IRON: Iron: 73 ug/dL (ref 27–159)

## 2023-10-06 ENCOUNTER — Encounter: Payer: Self-pay | Admitting: Family Medicine

## 2023-10-06 DIAGNOSIS — G43809 Other migraine, not intractable, without status migrainosus: Secondary | ICD-10-CM

## 2023-10-07 NOTE — Addendum Note (Signed)
 Addended by: EVELINE LAURAINE BRAVO on: 10/07/2023 02:52 PM   Modules accepted: Orders

## 2023-10-08 ENCOUNTER — Encounter: Payer: Self-pay | Admitting: Neurology

## 2023-10-11 ENCOUNTER — Ambulatory Visit (HOSPITAL_BASED_OUTPATIENT_CLINIC_OR_DEPARTMENT_OTHER)

## 2023-10-18 ENCOUNTER — Ambulatory Visit (HOSPITAL_BASED_OUTPATIENT_CLINIC_OR_DEPARTMENT_OTHER)

## 2023-10-18 ENCOUNTER — Ambulatory Visit (HOSPITAL_BASED_OUTPATIENT_CLINIC_OR_DEPARTMENT_OTHER)
Admission: RE | Admit: 2023-10-18 | Discharge: 2023-10-18 | Disposition: A | Discharge status: Home / Self Care | Source: Ambulatory Visit | Attending: Advanced Practice Midwife | Admitting: Advanced Practice Midwife

## 2023-10-18 DIAGNOSIS — R102 Pelvic and perineal pain: Secondary | ICD-10-CM

## 2023-10-18 DIAGNOSIS — T839XXA Unspecified complication of genitourinary prosthetic device, implant and graft, initial encounter: Secondary | ICD-10-CM

## 2023-10-18 DIAGNOSIS — N939 Abnormal uterine and vaginal bleeding, unspecified: Secondary | ICD-10-CM

## 2023-10-19 ENCOUNTER — Other Ambulatory Visit (HOSPITAL_BASED_OUTPATIENT_CLINIC_OR_DEPARTMENT_OTHER)

## 2023-10-21 ENCOUNTER — Ambulatory Visit (HOSPITAL_BASED_OUTPATIENT_CLINIC_OR_DEPARTMENT_OTHER)

## 2023-10-25 ENCOUNTER — Ambulatory Visit (HOSPITAL_BASED_OUTPATIENT_CLINIC_OR_DEPARTMENT_OTHER)
Admission: RE | Admit: 2023-10-25 | Discharge: 2023-10-25 | Disposition: A | Discharge status: Home / Self Care | Source: Ambulatory Visit | Attending: Advanced Practice Midwife | Admitting: Advanced Practice Midwife

## 2023-10-25 DIAGNOSIS — N939 Abnormal uterine and vaginal bleeding, unspecified: Secondary | ICD-10-CM | POA: Diagnosis present

## 2023-10-25 DIAGNOSIS — R102 Pelvic and perineal pain: Secondary | ICD-10-CM | POA: Diagnosis present

## 2023-10-25 DIAGNOSIS — T839XXA Unspecified complication of genitourinary prosthetic device, implant and graft, initial encounter: Secondary | ICD-10-CM | POA: Diagnosis present

## 2023-10-27 ENCOUNTER — Ambulatory Visit: Payer: Self-pay | Admitting: Advanced Practice Midwife

## 2023-10-29 ENCOUNTER — Institutional Professional Consult (permissible substitution) (INDEPENDENT_AMBULATORY_CARE_PROVIDER_SITE_OTHER): Admitting: Nurse Practitioner

## 2023-11-12 ENCOUNTER — Encounter: Payer: Self-pay | Admitting: Family Medicine

## 2023-11-12 ENCOUNTER — Institutional Professional Consult (permissible substitution) (INDEPENDENT_AMBULATORY_CARE_PROVIDER_SITE_OTHER): Admitting: Nurse Practitioner

## 2023-11-14 ENCOUNTER — Ambulatory Visit: Admitting: Obstetrics and Gynecology

## 2023-11-26 ENCOUNTER — Institutional Professional Consult (permissible substitution) (INDEPENDENT_AMBULATORY_CARE_PROVIDER_SITE_OTHER): Admitting: Nurse Practitioner

## 2023-12-10 ENCOUNTER — Encounter: Payer: Self-pay | Admitting: Family Medicine

## 2023-12-16 ENCOUNTER — Ambulatory Visit: Admitting: Obstetrics and Gynecology

## 2023-12-26 ENCOUNTER — Ambulatory Visit: Admitting: Family Medicine

## 2024-01-06 ENCOUNTER — Ambulatory Visit: Admitting: Family Medicine

## 2024-01-12 ENCOUNTER — Ambulatory Visit: Admitting: Family Medicine
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
## 2024-02-03 ENCOUNTER — Ambulatory Visit: Admitting: Neurology

## 2024-02-03 ENCOUNTER — Other Ambulatory Visit

## 2024-02-03 ENCOUNTER — Encounter: Payer: Self-pay | Admitting: Neurology

## 2024-02-03 VITALS — BP 126/80 | HR 104 | Ht 61.0 in | Wt 172.4 lb

## 2024-02-03 DIAGNOSIS — Z79899 Other long term (current) drug therapy: Secondary | ICD-10-CM

## 2024-02-03 DIAGNOSIS — G932 Benign intracranial hypertension: Secondary | ICD-10-CM | POA: Diagnosis not present

## 2024-02-03 DIAGNOSIS — G4719 Other hypersomnia: Secondary | ICD-10-CM

## 2024-02-03 NOTE — Patient Instructions (Addendum)
 Check MRI and MRV of brain Check BMP Refer for spinal tap to measure opening pressure and checking CSF cell count, protein, glucose, gram stain/culture and cytology Refer to [facility] to sleep specialist to evaluate for sleep apnea Modify diet - follow up with [facility] Exercise as tolerated Limit use of pain relievers to no more than 9 days out of the month to prevent risk of rebound or medication-overuse headache. Follow up 6 months but further recommendations pending results.  Idiopathic Intracranial Hypertension  Idiopathic intracranial hypertension (IIH) is a condition that increases pressure around the brain. The fluid that surrounds the brain and spinal cord (cerebrospinal fluid, or CSF) increases and causes the pressure. Idiopathic means that the cause of this condition is not known. IIH affects the brain and spinal cord. If this condition is not treated, it can cause vision loss or blindness. What are the causes? The cause of this condition is not known. What increases the risk? The following factors may make you more likely to develop this condition: Being obese. Being a person who is female, between the ages of 85 and 27 years old years old, and who has not gone through menopause. Taking certain medicines, such as birth control, acne medicines, or steroids. What are the signs or symptoms? Symptoms of this condition include: Headaches. This is the most common symptom. Brief periods of total blindness. Double vision, blurred vision, or poor side (peripheral) vision. Pain in the shoulders or neck. Nausea and vomiting. A sound like rushing water or a pulsing sound within the ears (pulsatile tinnitus), or ringing in the ears. How is this diagnosed? This condition may be diagnosed based on: Your symptoms and medical history. Imaging tests of the brain, such as: CT scan. MRI. Magnetic resonance venogram (MRV) to check the veins. Diagnostic lumbar  puncture. This is a procedure to remove and examine a sample of CSF. This procedure can determine whether your fluid pressure is too high. An eye exam to check for swelling or nerve damage in the eyes. How is this treated? Treatment for this condition depends on the symptoms. The goal of treatment is to decrease the pressure around your brain. Common treatments include: Weight loss through healthy eating, salt restriction, and exercise, if you are overweight. Medicines to decrease the production of CSF and lower the pressure within your skull. Medicines to prevent or treat headaches. Other treatments may include: Surgery to place drains (shunts) in your brain to remove extra fluid. Lumbar puncture to remove extra CSF. Follow these instructions at home: If you are overweight or obese, work with your health care provider to lose weight. Take over-the-counter and prescription medicines only as told by your health care provider. Ask your health care provider if the medicine prescribed to you requires you to avoid driving or using machinery. Do not use any products that contain nicotine or tobacco. These products include cigarettes, chewing tobacco, and vaping devices, such as e-cigarettes. If you need help quitting, ask your health care provider. Keep all follow-up visits. Your health care provider will need to monitor you regularly. Contact a health care provider if: You have changes in your vision, such as: Double vision. Blurred vision. Poor peripheral vision. Get help right away if: You have any of the following symptoms and they get worse or do not get better: Headaches. Nausea. Vomiting. Sudden trouble seeing. This information is not intended to replace advice given to you by your health care provider. Make sure you discuss any questions you  have with your health care provider. Document Revised: 07/17/2021 Document Reviewed: 06/26/2021 Elsevier Patient Education  2024 [employer].

## 2024-02-04 LAB — BASIC METABOLIC PANEL WITH GFR
BUN: 9 mg/dL (ref 7–25)
CO2: 26 mmol/L (ref 20–32)
Calcium: 9.3 mg/dL (ref 8.6–10.2)
Chloride: 102 mmol/L (ref 98–110)
Creat: 0.87 mg/dL (ref 0.50–0.96)
Glucose, Bld: 81 mg/dL (ref 65–99)
Potassium: 3.8 mmol/L (ref 3.5–5.3)
Sodium: 138 mmol/L (ref 135–146)
eGFR: 94 mL/min/1.73m2 (ref >=60)

## 2024-02-09 ENCOUNTER — Telehealth: Payer: Self-pay

## 2024-02-09 NOTE — Telephone Encounter (Signed)
 We are writing to tell you that your request for authorization has been approved. Service Requested: Brain MRI (with or without Internal Auditory Canal  views) (Magnetic Resonance Imaging - pictures of inside  your brain) Amount approved: 1 unit  Date(s) approved: February 03, 2024 to March 04, 2024   We are writing to tell you that your request for authorization has been approved. Service Requested: Brain MRA (Magnetic Resonance Angiography - pictures  of blood vessels inside your brain) Amount approved: 1 unit  Date(s) approved: February 03, 2024 to March 04, 2024

## 2024-02-12 ENCOUNTER — Other Ambulatory Visit (HOSPITAL_COMMUNITY)

## 2024-03-01 ENCOUNTER — Ambulatory Visit: Admitting: Adult Health

## 2024-03-08 ENCOUNTER — Ambulatory Visit (HOSPITAL_COMMUNITY): Admission: RE | Admit: 2024-03-08 | Source: Ambulatory Visit

## 2024-03-08 NOTE — Telephone Encounter (Addendum)
 Appt scheduled outside of approval dates.   Spoke to rep [place], at [place] [employer], unable to extend the dates of approval. Will need to Void the auth's.   PA restarted today with clinical notes attached and a note stating patient never had Procedure performed.    LMOVM for patient as well.

## 2024-03-09 NOTE — Telephone Encounter (Addendum)
 Dear [name]: We are writing to tell you that your request for authorization has been approved. Service Requested: Brain MRA (Magnetic Resonance Angiography - pictures  of blood vessels inside your brain) Amount approved: 1 unit  Date(s) approved: March 08, 2024 to April 07, 2024 For services to continue beyond the dates approved above, your provider will need to  send a new authorization request.  If you have any questions, please call Member Services at [phone] (TTY [phone]), 24 hours a day, 7 days a week. You can also talk to your provider who also  received this information.  Thank you for choosing [facility] .     Dear [name]: We are writing to tell you that your request for authorization has been approved. Service Requested: Brain MRI (with or without Internal Auditory Canal  views) (Magnetic Resonance Imaging - pictures of inside  your brain) Amount approved: 1 unit  Date(s) approved: March 08, 2024 to April 07, 2024 For services to continue beyond the dates approved above, your provider will need to  send a new authorization request.  If you have any questions, please call Member Services at [phone] (TTY [phone]), 24 hours a day, 7 days a week. You can also talk to your provider who also  received this information.  Thank you for choosing [facility]     Imaging advised and mychart message sent to patient

## 2024-03-10 ENCOUNTER — Ambulatory Visit: Admitting: Family Medicine

## 2024-03-16 ENCOUNTER — Ambulatory Visit (HOSPITAL_COMMUNITY)

## 2024-03-18 ENCOUNTER — Ambulatory Visit (HOSPITAL_COMMUNITY)
Admission: RE | Admit: 2024-03-18 | Discharge: 2024-03-18 | Disposition: A | Discharge status: Home / Self Care | Source: Ambulatory Visit | Attending: Neurology | Admitting: Neurology

## 2024-03-18 ENCOUNTER — Ambulatory Visit (HOSPITAL_COMMUNITY): Admission: RE | Admit: 2024-03-18 | Discharge: 2024-03-18 | Attending: Neurology

## 2024-03-18 DIAGNOSIS — G932 Benign intracranial hypertension: Secondary | ICD-10-CM

## 2024-03-24 ENCOUNTER — Ambulatory Visit (HOSPITAL_BASED_OUTPATIENT_CLINIC_OR_DEPARTMENT_OTHER): Admitting: Pulmonary Disease

## 2024-03-25 ENCOUNTER — Ambulatory Visit: Payer: Self-pay | Admitting: Neurology

## 2024-03-25 ENCOUNTER — Encounter: Payer: Self-pay | Admitting: Family Medicine

## 2024-03-25 DIAGNOSIS — G932 Benign intracranial hypertension: Secondary | ICD-10-CM

## 2024-03-25 NOTE — Telephone Encounter (Signed)
 LMOVM for patient to call the office back.

## 2024-03-25 NOTE — Telephone Encounter (Signed)
-----   Message from Juliene Dunnings, DO sent at 03/25/2024  7:09 AM EST ----- MRI shows nonspecific findings but may be seen in increased intracranial pressure.  However, there isn't anything concerning seen, such as tumor.  Next step would be schedule the spinal tap (as  discussed) checking opening pressure and checking CSF cell count, protein, glucose, gram stain/culture and cytology

## 2024-03-26 NOTE — Progress Notes (Signed)
 2nd call to patient no answer. LMOVM to call the office back.

## 2024-03-29 ENCOUNTER — Ambulatory Visit: Admitting: Obstetrics and Gynecology

## 2024-04-02 ENCOUNTER — Telehealth: Admitting: Obstetrics and Gynecology

## 2024-04-02 DIAGNOSIS — K5901 Slow transit constipation: Secondary | ICD-10-CM

## 2024-04-02 DIAGNOSIS — G8929 Other chronic pain: Secondary | ICD-10-CM

## 2024-04-02 DIAGNOSIS — N83201 Unspecified ovarian cyst, right side: Secondary | ICD-10-CM

## 2024-04-02 DIAGNOSIS — N941 Unspecified dyspareunia: Secondary | ICD-10-CM

## 2024-04-02 NOTE — Progress Notes (Signed)
 "   GYNECOLOGY VIRTUAL VISIT ENCOUNTER NOTE  Provider location: [facility] at office   Patient location: Home  I connected with Kristina Blair on 04/02/24 at 10:35 AM EST by MyChart Video Encounter and verified that I am speaking with the correct person using two identifiers.   I discussed the limitations, risks, security and privacy concerns of performing an evaluation and management service virtually and the availability of in person appointments. I also discussed with the patient that there may be a patient responsible charge related to this service. The patient expressed understanding and agreed to proceed.   History:  Kristina Blair is a 29 y.o. G0P0000 female being evaluated today for US  follow up. Has been having really painful cramps before, during and after. Typically taking . Had painful periods prior to the IUD being placed. Cycles are monthly with the IUD in place. Taking 800mg  at a time but does not think it helps much. Takes it just when the pain starts. Has not tried  aleve  previoulsy. Has always had painful periods. Has been on birth control since about 29 years old; IUD in 2021 but previously all pills. Did not feel relief on birth control pills. With the IUD no real bleeding most . 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 Has pain with intercourse as well; separate pain that is more like burning. No prior PFPT.  IUD 2021 placed      Past Medical History:  Diagnosis Date   Abnormal thyroid  function test 01/17/2021   Abnormal weight loss 06/18/2021   Allergy    Anxiety    ASCUS of cervix with negative high risk HPV    Asthma    [employer]rees[employer] 02/13/2020   Generalized anxiety disorder 06/28/2021   GERD (gastroesophageal reflux disease)    [employer]reesia 02/13/2020   Hyperlipidemia 07/15/2023   Hypoglycemia 01/17/2021   IUD (intrauterine device) in place 09/2019   liletta   Malnutrition of moderate degree 06/11/2021   Near syncope 05/30/2021   Paroxysmal SVT  (supraventricular tachycardia)    Past Surgical History:  Procedure Laterality Date   ESOPHAGOGASTRODUODENOSCOPY (EGD) WITH PROPOFOL  N/A 06/14/2021   Procedure: ESOPHAGOGASTRODUODENOSCOPY (EGD) WITH PROPOFOL ;  Surgeon: Burnette Fallow, MD;  Location: [facility] ENDOSCOPY;  Service: Gastroenterology;  Laterality: N/A;   WISDOM TOOTH EXTRACTION     The following portions of the patient's history were reviewed and updated as appropriate: allergies, current medications, past family history, past medical history, past social history, past surgical history and problem list.   Health Maintenance:  Normal pap and negative HRHPV on 12/2022 NILM  Review of Systems:  Pertinent items noted in HPI and remainder of comprehensive ROS otherwise negative.  Physical Exam:   General:  Alert, oriented and cooperative. Patient appears to be in no acute distress.  Mental Status: Normal mood and affect. Normal behavior. Normal judgment and thought content.   Respiratory: Normal respiratory effort, no problems with respiration noted  Rest of physical exam deferred due to type of encounter  Labs and Imaging Pelvic U 8.2025   IMPRESSION: 1. Intrauterine device appears appropriately positioned. 2. Complex hypoechoic lesion within the right ovary, potentially hemorrhagic cyst. Recommend follow-up pelvic ultrasound in 6-8 weeks to ensure resolution. 3. Cystic lesion with internal septation within the left ovary. Recommend attention on follow-up.    Assessment and Plan:     1. Chronic pelvic pain in female (Primary) 2. Dyspareunia in female 3. Slow transit constipation Reviewed different etiologies for pelvic pain and that more than  one can be present at once. Suspect pelvic floor pain and dysfunction present given lognstanding history of constipation and dysmenorrhea, but would need to be assessed in person. Will schedule follow up for pelvic exam. Discussed possible contributors to pain including primary  dysmenorrhea vs endometiosis. Noted that current diagnostic criteria is pathology obtained with surgery but can also treat presumptively. Not having menses with IUD but still having pain which can be do to endometriosis or other causes. Can consider additional suppression in conjunction with the IUD. Recommend alternate NSAID of naproxen  for pain until inperson visit to assess for role of alternate analgesia such as muscle relaxer, neuroleptic, clonidine, etc.  - Ambulatory referral to Physical Therapy  4. Cysts of both ovaries Pelvic US  ordered to reassess ovarian cysts with favor for benign process given size and description.  - US  PELVIC COMPLETE WITH TRANSVAGINAL; Future       I discussed the assessment and treatment plan with the patient. The patient was provided an opportunity to ask questions and all were answered. The patient agreed with the plan and demonstrated an understanding of the instructions.   The patient was advised to call back or seek an in-person evaluation/go to the ED if the symptoms worsen or if the condition fails to improve as anticipated.  I provided 15 minutes of face-to-face time during this encounter. I also spent 10 minutes dedicated to the care of this patient including pre-visit review of records, post visit ordering of medications and appropriate tests or procedures, coordinating care and documenting this visit encounter.    Carter Quarry, MD [facility] [employer] "

## 2024-04-02 NOTE — Patient Instructions (Signed)
Call [phone] to schedule your ultrasound

## 2024-04-03 ENCOUNTER — Ambulatory Visit (HOSPITAL_BASED_OUTPATIENT_CLINIC_OR_DEPARTMENT_OTHER)

## 2024-04-23 ENCOUNTER — Ambulatory Visit (HOSPITAL_BASED_OUTPATIENT_CLINIC_OR_DEPARTMENT_OTHER): Admitting: Pulmonary Disease

## 2024-05-07 ENCOUNTER — Ambulatory Visit: Payer: Self-pay | Admitting: Obstetrics and Gynecology

## 2024-08-03 ENCOUNTER — Ambulatory Visit: Payer: Self-pay | Admitting: Neurology
